# Patient Record
Sex: Male | Born: 1935 | Race: White | Hispanic: No | Marital: Single | Smoking: Never smoker
Health system: Southern US, Community
[De-identification: ages and names within clinical notes are randomized; demographics above are authoritative.]

## PROBLEM LIST (undated history)

## (undated) DIAGNOSIS — E079 Disorder of thyroid, unspecified: Secondary | ICD-10-CM

## (undated) DIAGNOSIS — E119 Type 2 diabetes mellitus without complications: Secondary | ICD-10-CM

## (undated) DIAGNOSIS — E7800 Pure hypercholesterolemia, unspecified: Secondary | ICD-10-CM

## (undated) DIAGNOSIS — N400 Enlarged prostate without lower urinary tract symptoms: Secondary | ICD-10-CM

## (undated) HISTORY — PX: OTHER SURGICAL HISTORY: SHX169

---

## 2002-01-19 ENCOUNTER — Ambulatory Visit (HOSPITAL_COMMUNITY): Admission: RE | Admit: 2002-01-19 | Discharge: 2002-01-19 | Payer: Self-pay | Admitting: Gastroenterology

## 2006-07-09 ENCOUNTER — Ambulatory Visit (HOSPITAL_COMMUNITY): Admission: RE | Admit: 2006-07-09 | Discharge: 2006-07-09 | Payer: Self-pay | Admitting: Ophthalmology

## 2007-07-16 ENCOUNTER — Encounter: Admission: RE | Admit: 2007-07-16 | Discharge: 2007-07-16 | Payer: Self-pay | Admitting: Internal Medicine

## 2011-02-08 NOTE — Procedures (Signed)
[facility]  Patient:    Jorge Butler, Jorge Butler Visit Number: [id number] MRN: [id number]          Service Type: END Location: ENDO Attending Physician:  Orland Mustard Dictated by:   Llana Aliment. Randa Evens, M.D. Proc. Date: 01/19/02 Admit Date:  01/19/2002   CC:         Gwen Pounds, M.D.  Barron Alvine, M.D.   Procedure Report  DATE OF BIRTH:  [birth year].  PROCEDURE:  Colonoscopy.  MEDICATIONS:  Fentanyl 75 mcg, Versed 7 mg IV.  SCOPE:  Olympus Adult video colonoscope.  INDICATION:  Strong family history of colon cancer. A brother had colon cancer another had metastatic cancer of the liver of unknown primary.  DESCRIPTION OF PROCEDURE:  The procedure had been explained to the patient and consent obtained. With the patient in the left lateral decubitus position, the Olympus Adult video colonoscope was inserted and advanced under direct visualization. The prep was excellent. We were able to reach the cecum without difficulty. The ileocecal valve and appendiceal orifice were seen. The scope was withdrawn and the cecum, ascending colon, hepatic flexure, transverse colon, splenic flexure, descending and sigmoid colon seen well. No polyps seen. No significant diverticular disease. The scope was withdrawn down to the rectum and the rectum was free of polyps. The patient tolerated the procedure well, was maintained on low flow oxygen and pulse oximeter throughout the procedure.  ASSESSMENT:  No evidence of colon polyps in this high risk individual.  PLAN:  Will recommend yearly Hemoccults and repeat procedure in five years. Dictated by:   Llana Aliment. Randa Evens, M.D. Attending Physician:  Orland Mustard DD:  01/19/02 TD:  01/19/02 Job: [id number]

## 2015-10-31 DIAGNOSIS — E119 Type 2 diabetes mellitus without complications: Secondary | ICD-10-CM | POA: Diagnosis not present

## 2015-10-31 DIAGNOSIS — H353132 Nonexudative age-related macular degeneration, bilateral, intermediate dry stage: Secondary | ICD-10-CM | POA: Diagnosis not present

## 2015-11-07 DIAGNOSIS — E298 Other testicular dysfunction: Secondary | ICD-10-CM | POA: Diagnosis not present

## 2015-11-28 DIAGNOSIS — E291 Testicular hypofunction: Secondary | ICD-10-CM | POA: Diagnosis not present

## 2015-12-06 DIAGNOSIS — H52223 Regular astigmatism, bilateral: Secondary | ICD-10-CM | POA: Diagnosis not present

## 2015-12-19 DIAGNOSIS — E298 Other testicular dysfunction: Secondary | ICD-10-CM | POA: Diagnosis not present

## 2015-12-27 DIAGNOSIS — R194 Change in bowel habit: Secondary | ICD-10-CM | POA: Diagnosis not present

## 2015-12-27 DIAGNOSIS — Z800 Family history of malignant neoplasm of digestive organs: Secondary | ICD-10-CM | POA: Diagnosis not present

## 2015-12-27 DIAGNOSIS — Z7984 Long term (current) use of oral hypoglycemic drugs: Secondary | ICD-10-CM | POA: Diagnosis not present

## 2015-12-27 DIAGNOSIS — E119 Type 2 diabetes mellitus without complications: Secondary | ICD-10-CM | POA: Diagnosis not present

## 2016-01-04 DIAGNOSIS — R194 Change in bowel habit: Secondary | ICD-10-CM | POA: Diagnosis not present

## 2016-01-09 DIAGNOSIS — E298 Other testicular dysfunction: Secondary | ICD-10-CM | POA: Diagnosis not present

## 2016-01-30 DIAGNOSIS — E291 Testicular hypofunction: Secondary | ICD-10-CM | POA: Diagnosis not present

## 2016-02-13 DIAGNOSIS — R194 Change in bowel habit: Secondary | ICD-10-CM | POA: Diagnosis not present

## 2016-02-20 DIAGNOSIS — E291 Testicular hypofunction: Secondary | ICD-10-CM | POA: Diagnosis not present

## 2016-02-27 DIAGNOSIS — E038 Other specified hypothyroidism: Secondary | ICD-10-CM | POA: Diagnosis not present

## 2016-02-27 DIAGNOSIS — Z125 Encounter for screening for malignant neoplasm of prostate: Secondary | ICD-10-CM | POA: Diagnosis not present

## 2016-02-27 DIAGNOSIS — Z0000 Encounter for general adult medical examination without abnormal findings: Secondary | ICD-10-CM | POA: Diagnosis not present

## 2016-02-27 DIAGNOSIS — E1122 Type 2 diabetes mellitus with diabetic chronic kidney disease: Secondary | ICD-10-CM | POA: Diagnosis not present

## 2016-03-05 DIAGNOSIS — R1319 Other dysphagia: Secondary | ICD-10-CM | POA: Diagnosis not present

## 2016-03-05 DIAGNOSIS — E230 Hypopituitarism: Secondary | ICD-10-CM | POA: Diagnosis not present

## 2016-03-05 DIAGNOSIS — R279 Unspecified lack of coordination: Secondary | ICD-10-CM | POA: Diagnosis not present

## 2016-03-05 DIAGNOSIS — D692 Other nonthrombocytopenic purpura: Secondary | ICD-10-CM | POA: Diagnosis not present

## 2016-03-05 DIAGNOSIS — N183 Chronic kidney disease, stage 3 (moderate): Secondary | ICD-10-CM | POA: Diagnosis not present

## 2016-03-05 DIAGNOSIS — N401 Enlarged prostate with lower urinary tract symptoms: Secondary | ICD-10-CM | POA: Diagnosis not present

## 2016-03-05 DIAGNOSIS — Z0000 Encounter for general adult medical examination without abnormal findings: Secondary | ICD-10-CM | POA: Diagnosis not present

## 2016-03-05 DIAGNOSIS — E1122 Type 2 diabetes mellitus with diabetic chronic kidney disease: Secondary | ICD-10-CM | POA: Diagnosis not present

## 2016-03-05 DIAGNOSIS — H4089 Other specified glaucoma: Secondary | ICD-10-CM | POA: Diagnosis not present

## 2016-03-05 DIAGNOSIS — H3530 Unspecified macular degeneration: Secondary | ICD-10-CM | POA: Diagnosis not present

## 2016-03-12 DIAGNOSIS — E038 Other specified hypothyroidism: Secondary | ICD-10-CM | POA: Diagnosis not present

## 2016-03-19 DIAGNOSIS — N183 Chronic kidney disease, stage 3 (moderate): Secondary | ICD-10-CM | POA: Diagnosis not present

## 2016-03-21 ENCOUNTER — Ambulatory Visit
Admission: RE | Admit: 2016-03-21 | Discharge: 2016-03-21 | Disposition: A | Discharge status: Home / Self Care | Payer: Medicare Other | Source: Ambulatory Visit | Attending: Internal Medicine | Admitting: Internal Medicine

## 2016-03-21 ENCOUNTER — Other Ambulatory Visit: Payer: Self-pay | Admitting: Internal Medicine

## 2016-03-21 DIAGNOSIS — N183 Chronic kidney disease, stage 3 unspecified: Secondary | ICD-10-CM

## 2016-03-21 DIAGNOSIS — N189 Chronic kidney disease, unspecified: Secondary | ICD-10-CM | POA: Diagnosis not present

## 2016-04-02 DIAGNOSIS — E298 Other testicular dysfunction: Secondary | ICD-10-CM | POA: Diagnosis not present

## 2016-04-23 DIAGNOSIS — E298 Other testicular dysfunction: Secondary | ICD-10-CM | POA: Diagnosis not present

## 2016-05-06 DIAGNOSIS — R972 Elevated prostate specific antigen [PSA]: Secondary | ICD-10-CM | POA: Diagnosis not present

## 2016-05-13 DIAGNOSIS — N401 Enlarged prostate with lower urinary tract symptoms: Secondary | ICD-10-CM | POA: Diagnosis not present

## 2016-05-13 DIAGNOSIS — R351 Nocturia: Secondary | ICD-10-CM | POA: Diagnosis not present

## 2016-05-13 DIAGNOSIS — R972 Elevated prostate specific antigen [PSA]: Secondary | ICD-10-CM | POA: Diagnosis not present

## 2016-05-14 DIAGNOSIS — E298 Other testicular dysfunction: Secondary | ICD-10-CM | POA: Diagnosis not present

## 2016-05-29 DIAGNOSIS — H353111 Nonexudative age-related macular degeneration, right eye, early dry stage: Secondary | ICD-10-CM | POA: Diagnosis not present

## 2016-05-29 DIAGNOSIS — E113291 Type 2 diabetes mellitus with mild nonproliferative diabetic retinopathy without macular edema, right eye: Secondary | ICD-10-CM | POA: Diagnosis not present

## 2016-05-29 DIAGNOSIS — H353121 Nonexudative age-related macular degeneration, left eye, early dry stage: Secondary | ICD-10-CM | POA: Diagnosis not present

## 2016-06-04 DIAGNOSIS — E291 Testicular hypofunction: Secondary | ICD-10-CM | POA: Diagnosis not present

## 2016-06-22 DIAGNOSIS — Z23 Encounter for immunization: Secondary | ICD-10-CM | POA: Diagnosis not present

## 2016-06-26 DIAGNOSIS — E291 Testicular hypofunction: Secondary | ICD-10-CM | POA: Diagnosis not present

## 2016-07-16 DIAGNOSIS — E291 Testicular hypofunction: Secondary | ICD-10-CM | POA: Diagnosis not present

## 2016-08-06 DIAGNOSIS — E291 Testicular hypofunction: Secondary | ICD-10-CM | POA: Diagnosis not present

## 2016-08-27 DIAGNOSIS — E291 Testicular hypofunction: Secondary | ICD-10-CM | POA: Diagnosis not present

## 2016-09-02 DIAGNOSIS — N401 Enlarged prostate with lower urinary tract symptoms: Secondary | ICD-10-CM | POA: Diagnosis not present

## 2016-09-02 DIAGNOSIS — E230 Hypopituitarism: Secondary | ICD-10-CM | POA: Diagnosis not present

## 2016-09-02 DIAGNOSIS — E038 Other specified hypothyroidism: Secondary | ICD-10-CM | POA: Diagnosis not present

## 2016-09-02 DIAGNOSIS — Z6825 Body mass index (BMI) 25.0-25.9, adult: Secondary | ICD-10-CM | POA: Diagnosis not present

## 2016-09-02 DIAGNOSIS — N183 Chronic kidney disease, stage 3 (moderate): Secondary | ICD-10-CM | POA: Diagnosis not present

## 2016-09-02 DIAGNOSIS — E1122 Type 2 diabetes mellitus with diabetic chronic kidney disease: Secondary | ICD-10-CM | POA: Diagnosis not present

## 2016-09-02 DIAGNOSIS — R278 Other lack of coordination: Secondary | ICD-10-CM | POA: Diagnosis not present

## 2016-09-18 DIAGNOSIS — E291 Testicular hypofunction: Secondary | ICD-10-CM | POA: Diagnosis not present

## 2016-10-08 DIAGNOSIS — E291 Testicular hypofunction: Secondary | ICD-10-CM | POA: Diagnosis not present

## 2016-10-29 DIAGNOSIS — E291 Testicular hypofunction: Secondary | ICD-10-CM | POA: Diagnosis not present

## 2016-11-19 DIAGNOSIS — E291 Testicular hypofunction: Secondary | ICD-10-CM | POA: Diagnosis not present

## 2016-11-27 DIAGNOSIS — H353132 Nonexudative age-related macular degeneration, bilateral, intermediate dry stage: Secondary | ICD-10-CM | POA: Diagnosis not present

## 2016-11-27 IMAGING — US US RENAL
1 series · 14 of 25 positions shown · non-contrast
Comparison: None.

CLINICAL DATA: Chronic renal failure.

EXAM:
RENAL / URINARY TRACT ULTRASOUND COMPLETE

[Series 1: us renal · 0.28mm/px · 14 of 35 slices shown]
[im 1/35]
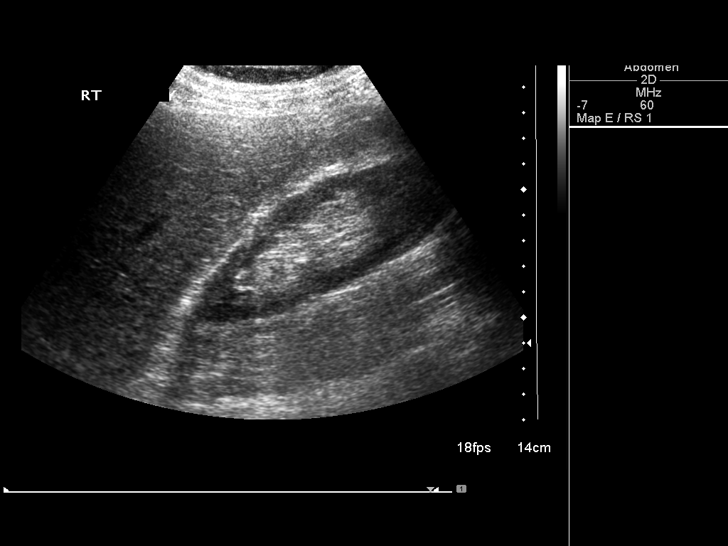
[im 3/35]
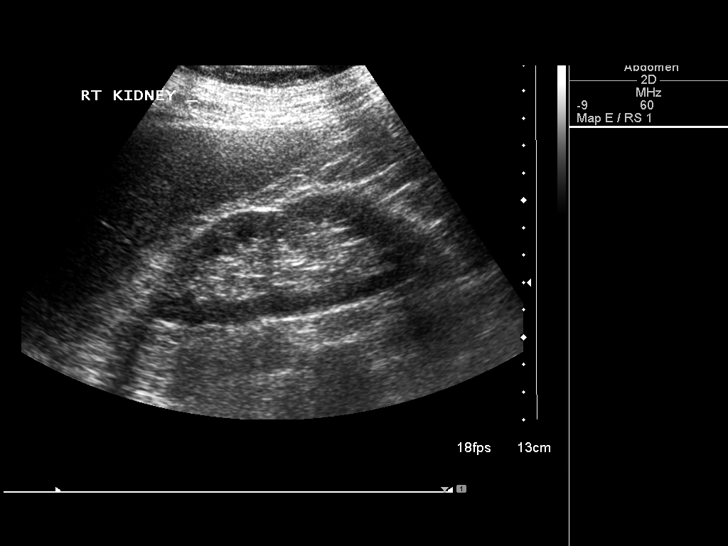
[im 6/35]
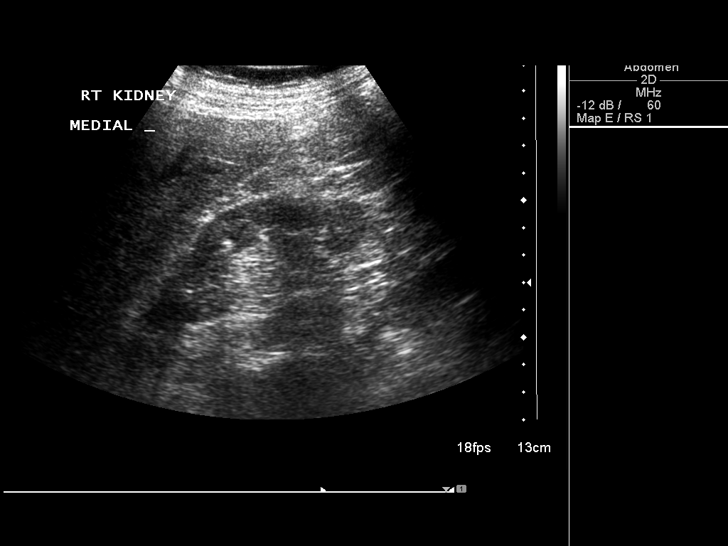
[im 9/35]
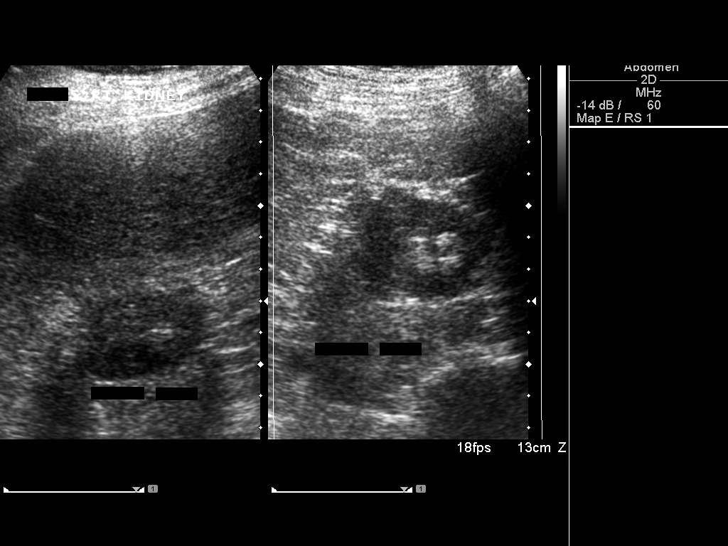
[im 12/35]
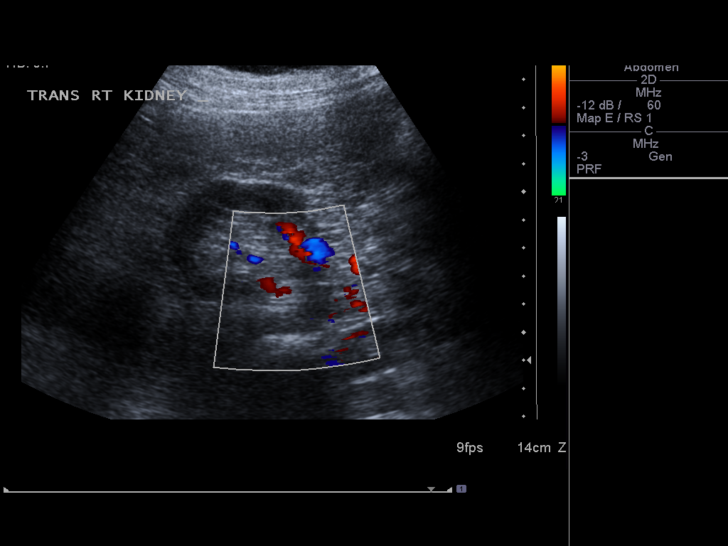
[im 13/35]
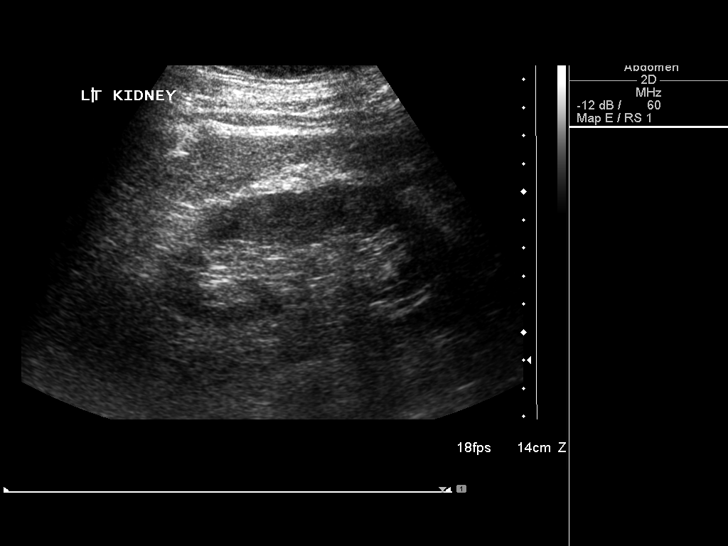
[im 16/35]
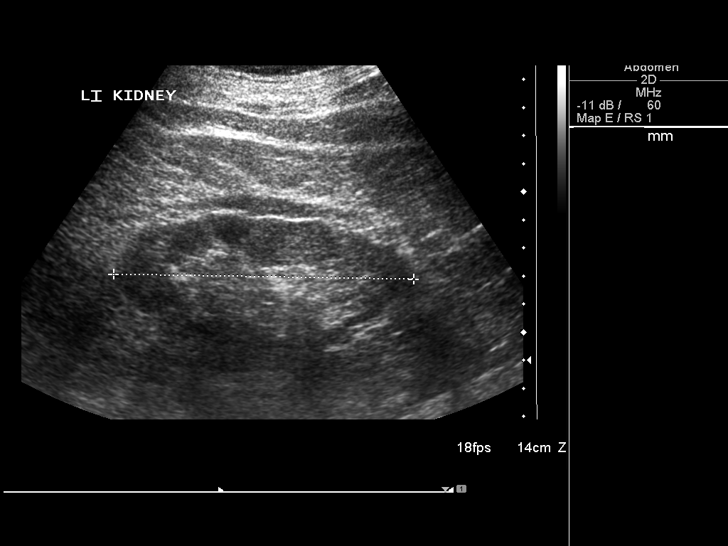
[im 19/35]
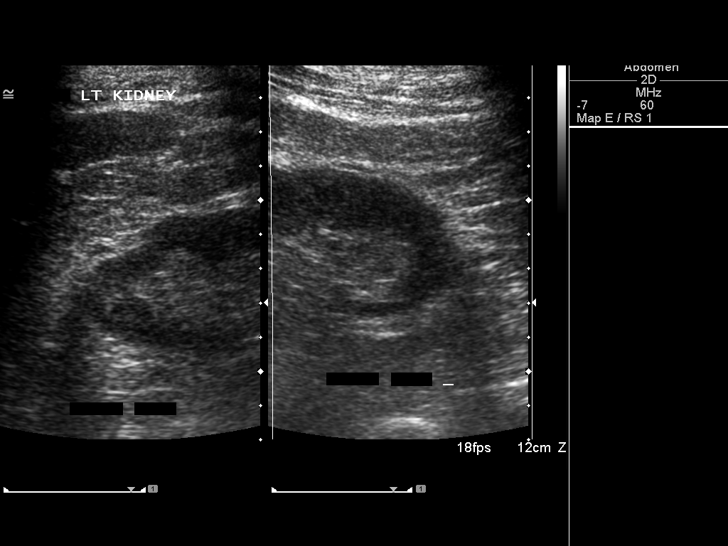
[im 22/35]
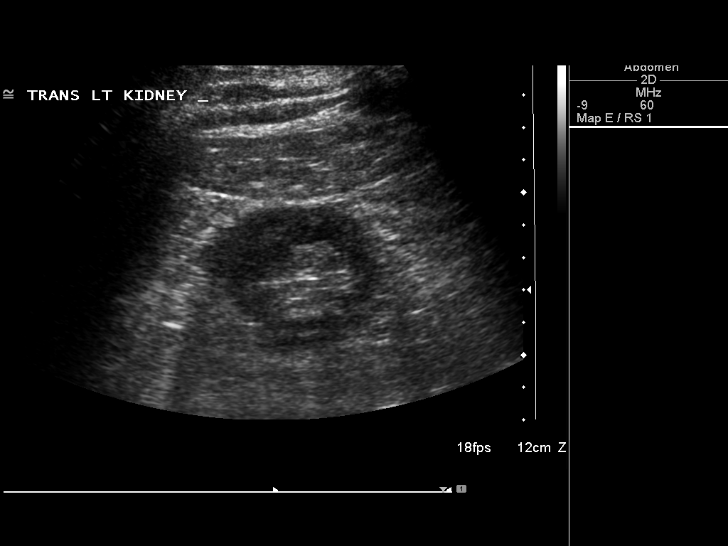
[im 23/35]
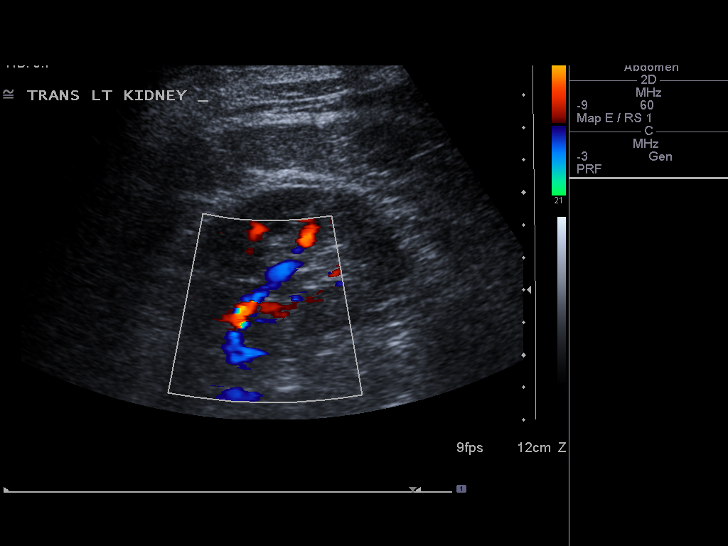
[im 26/35]
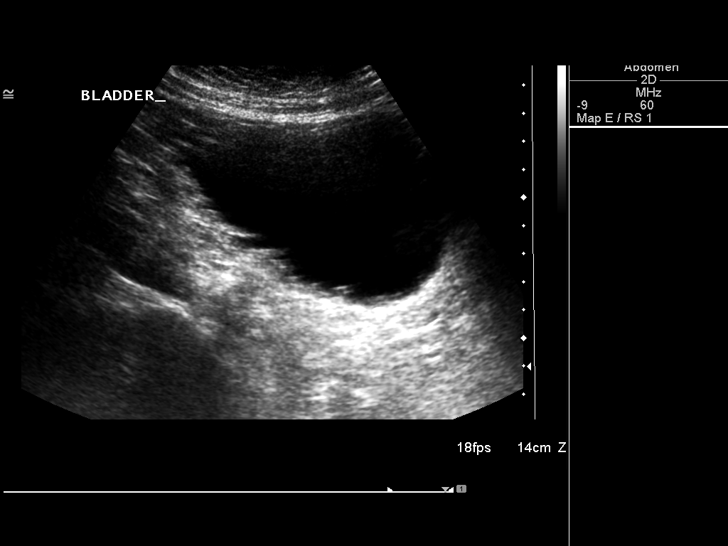
[im 29/35]
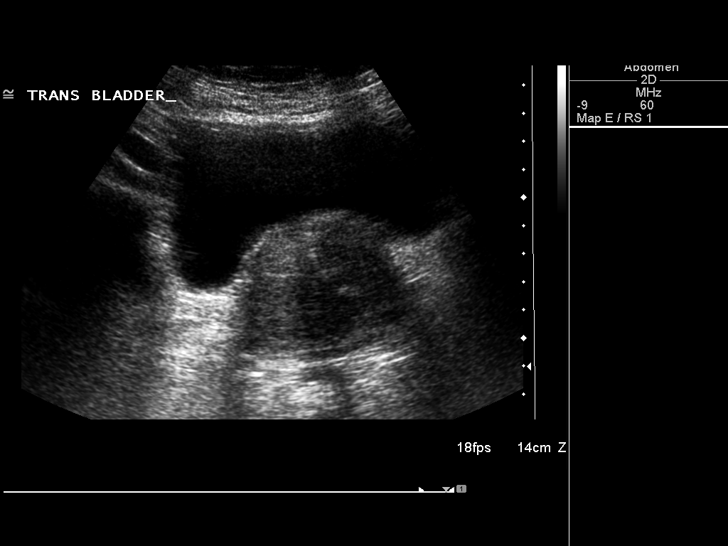
[im 32/35]
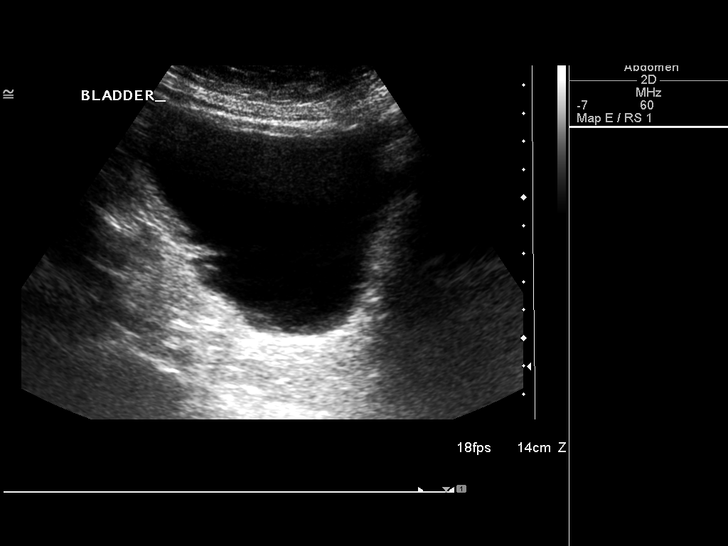
[im 35/35]
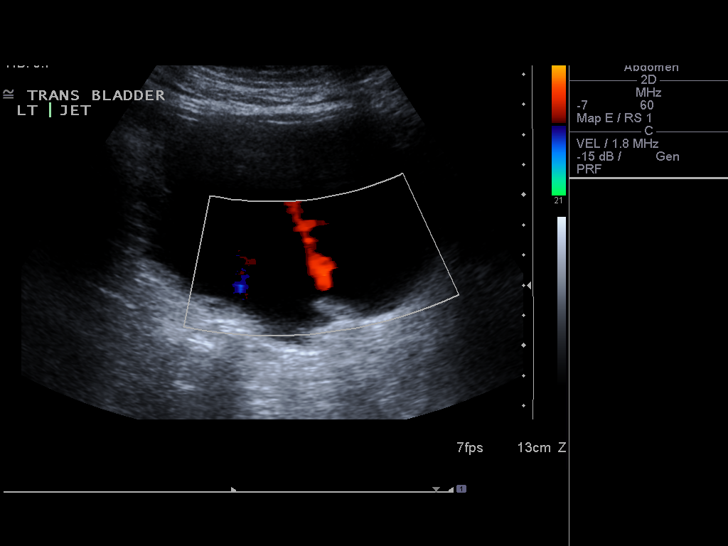

[14 of 25 positions shown; findings below may reference images not displayed]

FINDINGS: Right Kidney:

Length: 11.3 cm. Echogenicity within normal limits. No mass or
hydronephrosis visualized.

Left Kidney:

Length: 10.7 cm. Echogenicity within normal limits. No mass or
hydronephrosis visualized.

Bladder:

Appears normal for degree of bladder distention. Bilateral ureteral
jets are seen. The prostate gland is noted to be enlarged and
compressing on the posterior wall of the urinary bladder.
IMPRESSION: Normal appearance of the kidneys.

Prostate gland enlargement. Please correlate to patient's PSA
values.

## 2016-12-10 DIAGNOSIS — E291 Testicular hypofunction: Secondary | ICD-10-CM | POA: Diagnosis not present

## 2016-12-18 DIAGNOSIS — D352 Benign neoplasm of pituitary gland: Secondary | ICD-10-CM | POA: Diagnosis not present

## 2016-12-18 DIAGNOSIS — Z961 Presence of intraocular lens: Secondary | ICD-10-CM | POA: Diagnosis not present

## 2016-12-18 DIAGNOSIS — E113293 Type 2 diabetes mellitus with mild nonproliferative diabetic retinopathy without macular edema, bilateral: Secondary | ICD-10-CM | POA: Diagnosis not present

## 2016-12-18 DIAGNOSIS — H353132 Nonexudative age-related macular degeneration, bilateral, intermediate dry stage: Secondary | ICD-10-CM | POA: Diagnosis not present

## 2016-12-31 DIAGNOSIS — E291 Testicular hypofunction: Secondary | ICD-10-CM | POA: Diagnosis not present

## 2017-01-21 DIAGNOSIS — E291 Testicular hypofunction: Secondary | ICD-10-CM | POA: Diagnosis not present

## 2017-02-11 DIAGNOSIS — E298 Other testicular dysfunction: Secondary | ICD-10-CM | POA: Diagnosis not present

## 2017-02-27 DIAGNOSIS — Z125 Encounter for screening for malignant neoplasm of prostate: Secondary | ICD-10-CM | POA: Diagnosis not present

## 2017-02-27 DIAGNOSIS — E784 Other hyperlipidemia: Secondary | ICD-10-CM | POA: Diagnosis not present

## 2017-02-27 DIAGNOSIS — E1122 Type 2 diabetes mellitus with diabetic chronic kidney disease: Secondary | ICD-10-CM | POA: Diagnosis not present

## 2017-02-27 DIAGNOSIS — E038 Other specified hypothyroidism: Secondary | ICD-10-CM | POA: Diagnosis not present

## 2017-02-28 DIAGNOSIS — Z1212 Encounter for screening for malignant neoplasm of rectum: Secondary | ICD-10-CM | POA: Diagnosis not present

## 2017-03-06 DIAGNOSIS — N183 Chronic kidney disease, stage 3 (moderate): Secondary | ICD-10-CM | POA: Diagnosis not present

## 2017-03-06 DIAGNOSIS — E230 Hypopituitarism: Secondary | ICD-10-CM | POA: Diagnosis not present

## 2017-03-06 DIAGNOSIS — Z0000 Encounter for general adult medical examination without abnormal findings: Secondary | ICD-10-CM | POA: Diagnosis not present

## 2017-03-06 DIAGNOSIS — E1122 Type 2 diabetes mellitus with diabetic chronic kidney disease: Secondary | ICD-10-CM | POA: Diagnosis not present

## 2017-03-25 DIAGNOSIS — E298 Other testicular dysfunction: Secondary | ICD-10-CM | POA: Diagnosis not present

## 2017-04-15 DIAGNOSIS — E291 Testicular hypofunction: Secondary | ICD-10-CM | POA: Diagnosis not present

## 2017-05-06 DIAGNOSIS — E1122 Type 2 diabetes mellitus with diabetic chronic kidney disease: Secondary | ICD-10-CM | POA: Diagnosis not present

## 2017-05-27 DIAGNOSIS — E1122 Type 2 diabetes mellitus with diabetic chronic kidney disease: Secondary | ICD-10-CM | POA: Diagnosis not present

## 2017-06-04 DIAGNOSIS — H353132 Nonexudative age-related macular degeneration, bilateral, intermediate dry stage: Secondary | ICD-10-CM | POA: Diagnosis not present

## 2017-06-04 DIAGNOSIS — E113293 Type 2 diabetes mellitus with mild nonproliferative diabetic retinopathy without macular edema, bilateral: Secondary | ICD-10-CM | POA: Diagnosis not present

## 2017-06-04 DIAGNOSIS — H43813 Vitreous degeneration, bilateral: Secondary | ICD-10-CM | POA: Diagnosis not present

## 2017-06-11 DIAGNOSIS — R972 Elevated prostate specific antigen [PSA]: Secondary | ICD-10-CM | POA: Diagnosis not present

## 2017-06-11 DIAGNOSIS — N400 Enlarged prostate without lower urinary tract symptoms: Secondary | ICD-10-CM | POA: Diagnosis not present

## 2017-06-17 DIAGNOSIS — E291 Testicular hypofunction: Secondary | ICD-10-CM | POA: Diagnosis not present

## 2017-07-08 DIAGNOSIS — Z23 Encounter for immunization: Secondary | ICD-10-CM | POA: Diagnosis not present

## 2017-07-08 DIAGNOSIS — E298 Other testicular dysfunction: Secondary | ICD-10-CM | POA: Diagnosis not present

## 2017-07-29 DIAGNOSIS — E291 Testicular hypofunction: Secondary | ICD-10-CM | POA: Diagnosis not present

## 2017-08-19 DIAGNOSIS — E291 Testicular hypofunction: Secondary | ICD-10-CM | POA: Diagnosis not present

## 2017-09-02 DIAGNOSIS — E038 Other specified hypothyroidism: Secondary | ICD-10-CM | POA: Diagnosis not present

## 2017-09-02 DIAGNOSIS — E230 Hypopituitarism: Secondary | ICD-10-CM | POA: Diagnosis not present

## 2017-09-02 DIAGNOSIS — E1122 Type 2 diabetes mellitus with diabetic chronic kidney disease: Secondary | ICD-10-CM | POA: Diagnosis not present

## 2017-09-02 DIAGNOSIS — N183 Chronic kidney disease, stage 3 (moderate): Secondary | ICD-10-CM | POA: Diagnosis not present

## 2017-09-09 DIAGNOSIS — E298 Other testicular dysfunction: Secondary | ICD-10-CM | POA: Diagnosis not present

## 2017-09-30 DIAGNOSIS — E298 Other testicular dysfunction: Secondary | ICD-10-CM | POA: Diagnosis not present

## 2017-10-21 DIAGNOSIS — E291 Testicular hypofunction: Secondary | ICD-10-CM | POA: Diagnosis not present

## 2017-10-29 DIAGNOSIS — E1122 Type 2 diabetes mellitus with diabetic chronic kidney disease: Secondary | ICD-10-CM | POA: Diagnosis not present

## 2017-11-11 DIAGNOSIS — E298 Other testicular dysfunction: Secondary | ICD-10-CM | POA: Diagnosis not present

## 2017-12-02 DIAGNOSIS — H43813 Vitreous degeneration, bilateral: Secondary | ICD-10-CM | POA: Diagnosis not present

## 2017-12-02 DIAGNOSIS — E113293 Type 2 diabetes mellitus with mild nonproliferative diabetic retinopathy without macular edema, bilateral: Secondary | ICD-10-CM | POA: Diagnosis not present

## 2017-12-02 DIAGNOSIS — H353132 Nonexudative age-related macular degeneration, bilateral, intermediate dry stage: Secondary | ICD-10-CM | POA: Diagnosis not present

## 2017-12-03 DIAGNOSIS — E291 Testicular hypofunction: Secondary | ICD-10-CM | POA: Diagnosis not present

## 2017-12-17 DIAGNOSIS — H52221 Regular astigmatism, right eye: Secondary | ICD-10-CM | POA: Diagnosis not present

## 2017-12-23 DIAGNOSIS — E291 Testicular hypofunction: Secondary | ICD-10-CM | POA: Diagnosis not present

## 2018-01-13 DIAGNOSIS — E291 Testicular hypofunction: Secondary | ICD-10-CM | POA: Diagnosis not present

## 2018-01-21 DIAGNOSIS — N183 Chronic kidney disease, stage 3 (moderate): Secondary | ICD-10-CM | POA: Diagnosis not present

## 2018-01-21 DIAGNOSIS — I129 Hypertensive chronic kidney disease with stage 1 through stage 4 chronic kidney disease, or unspecified chronic kidney disease: Secondary | ICD-10-CM | POA: Diagnosis not present

## 2018-02-03 DIAGNOSIS — E291 Testicular hypofunction: Secondary | ICD-10-CM | POA: Diagnosis not present

## 2018-02-09 DIAGNOSIS — N183 Chronic kidney disease, stage 3 (moderate): Secondary | ICD-10-CM | POA: Diagnosis not present

## 2018-02-10 DIAGNOSIS — E871 Hypo-osmolality and hyponatremia: Secondary | ICD-10-CM | POA: Diagnosis not present

## 2018-02-12 DIAGNOSIS — Z6833 Body mass index (BMI) 33.0-33.9, adult: Secondary | ICD-10-CM | POA: Diagnosis not present

## 2018-02-12 DIAGNOSIS — R05 Cough: Secondary | ICD-10-CM | POA: Diagnosis not present

## 2018-02-12 DIAGNOSIS — E1122 Type 2 diabetes mellitus with diabetic chronic kidney disease: Secondary | ICD-10-CM | POA: Diagnosis not present

## 2018-02-25 DIAGNOSIS — E1122 Type 2 diabetes mellitus with diabetic chronic kidney disease: Secondary | ICD-10-CM | POA: Diagnosis not present

## 2018-02-25 DIAGNOSIS — R634 Abnormal weight loss: Secondary | ICD-10-CM | POA: Diagnosis not present

## 2018-02-25 DIAGNOSIS — E038 Other specified hypothyroidism: Secondary | ICD-10-CM | POA: Diagnosis not present

## 2018-02-25 DIAGNOSIS — N183 Chronic kidney disease, stage 3 (moderate): Secondary | ICD-10-CM | POA: Diagnosis not present

## 2018-03-03 DIAGNOSIS — Z125 Encounter for screening for malignant neoplasm of prostate: Secondary | ICD-10-CM | POA: Diagnosis not present

## 2018-03-03 DIAGNOSIS — E7849 Other hyperlipidemia: Secondary | ICD-10-CM | POA: Diagnosis not present

## 2018-03-03 DIAGNOSIS — R82998 Other abnormal findings in urine: Secondary | ICD-10-CM | POA: Diagnosis not present

## 2018-03-03 DIAGNOSIS — E038 Other specified hypothyroidism: Secondary | ICD-10-CM | POA: Diagnosis not present

## 2018-03-03 DIAGNOSIS — E1122 Type 2 diabetes mellitus with diabetic chronic kidney disease: Secondary | ICD-10-CM | POA: Diagnosis not present

## 2018-03-13 DIAGNOSIS — N184 Chronic kidney disease, stage 4 (severe): Secondary | ICD-10-CM | POA: Diagnosis not present

## 2018-03-13 DIAGNOSIS — D692 Other nonthrombocytopenic purpura: Secondary | ICD-10-CM | POA: Diagnosis not present

## 2018-03-13 DIAGNOSIS — Z0000 Encounter for general adult medical examination without abnormal findings: Secondary | ICD-10-CM | POA: Diagnosis not present

## 2018-03-13 DIAGNOSIS — E1122 Type 2 diabetes mellitus with diabetic chronic kidney disease: Secondary | ICD-10-CM | POA: Diagnosis not present

## 2018-03-17 DIAGNOSIS — E298 Other testicular dysfunction: Secondary | ICD-10-CM | POA: Diagnosis not present

## 2018-03-23 DIAGNOSIS — H9193 Unspecified hearing loss, bilateral: Secondary | ICD-10-CM | POA: Diagnosis not present

## 2018-03-23 DIAGNOSIS — Z6823 Body mass index (BMI) 23.0-23.9, adult: Secondary | ICD-10-CM | POA: Diagnosis not present

## 2018-03-23 DIAGNOSIS — H6123 Impacted cerumen, bilateral: Secondary | ICD-10-CM | POA: Diagnosis not present

## 2018-04-07 DIAGNOSIS — E298 Other testicular dysfunction: Secondary | ICD-10-CM | POA: Diagnosis not present

## 2018-04-28 DIAGNOSIS — E298 Other testicular dysfunction: Secondary | ICD-10-CM | POA: Diagnosis not present

## 2018-05-19 DIAGNOSIS — E291 Testicular hypofunction: Secondary | ICD-10-CM | POA: Diagnosis not present

## 2018-06-02 DIAGNOSIS — H353132 Nonexudative age-related macular degeneration, bilateral, intermediate dry stage: Secondary | ICD-10-CM | POA: Diagnosis not present

## 2018-06-02 DIAGNOSIS — H43813 Vitreous degeneration, bilateral: Secondary | ICD-10-CM | POA: Diagnosis not present

## 2018-06-02 DIAGNOSIS — E113293 Type 2 diabetes mellitus with mild nonproliferative diabetic retinopathy without macular edema, bilateral: Secondary | ICD-10-CM | POA: Diagnosis not present

## 2018-06-16 DIAGNOSIS — N401 Enlarged prostate with lower urinary tract symptoms: Secondary | ICD-10-CM | POA: Diagnosis not present

## 2018-06-16 DIAGNOSIS — R3914 Feeling of incomplete bladder emptying: Secondary | ICD-10-CM | POA: Diagnosis not present

## 2018-06-30 DIAGNOSIS — Z23 Encounter for immunization: Secondary | ICD-10-CM | POA: Diagnosis not present

## 2018-07-01 DIAGNOSIS — E298 Other testicular dysfunction: Secondary | ICD-10-CM | POA: Diagnosis not present

## 2018-07-21 DIAGNOSIS — E298 Other testicular dysfunction: Secondary | ICD-10-CM | POA: Diagnosis not present

## 2018-08-10 DIAGNOSIS — R3914 Feeling of incomplete bladder emptying: Secondary | ICD-10-CM | POA: Diagnosis not present

## 2018-08-10 DIAGNOSIS — N401 Enlarged prostate with lower urinary tract symptoms: Secondary | ICD-10-CM | POA: Diagnosis not present

## 2018-08-11 DIAGNOSIS — E291 Testicular hypofunction: Secondary | ICD-10-CM | POA: Diagnosis not present

## 2018-08-25 DIAGNOSIS — N184 Chronic kidney disease, stage 4 (severe): Secondary | ICD-10-CM | POA: Diagnosis not present

## 2018-08-25 DIAGNOSIS — E1122 Type 2 diabetes mellitus with diabetic chronic kidney disease: Secondary | ICD-10-CM | POA: Diagnosis not present

## 2018-08-25 DIAGNOSIS — E038 Other specified hypothyroidism: Secondary | ICD-10-CM | POA: Diagnosis not present

## 2018-08-25 DIAGNOSIS — N401 Enlarged prostate with lower urinary tract symptoms: Secondary | ICD-10-CM | POA: Diagnosis not present

## 2018-09-01 ENCOUNTER — Other Ambulatory Visit: Payer: Self-pay | Admitting: Internal Medicine

## 2018-09-01 DIAGNOSIS — R944 Abnormal results of kidney function studies: Secondary | ICD-10-CM | POA: Diagnosis not present

## 2018-09-01 DIAGNOSIS — R7989 Other specified abnormal findings of blood chemistry: Secondary | ICD-10-CM

## 2018-09-01 DIAGNOSIS — E291 Testicular hypofunction: Secondary | ICD-10-CM | POA: Diagnosis not present

## 2018-09-08 ENCOUNTER — Ambulatory Visit
Admission: RE | Admit: 2018-09-08 | Discharge: 2018-09-08 | Disposition: A | Discharge status: Home / Self Care | Payer: Medicare Other | Source: Ambulatory Visit | Attending: Internal Medicine | Admitting: Internal Medicine

## 2018-09-08 DIAGNOSIS — N2889 Other specified disorders of kidney and ureter: Secondary | ICD-10-CM | POA: Diagnosis not present

## 2018-09-08 DIAGNOSIS — R7989 Other specified abnormal findings of blood chemistry: Secondary | ICD-10-CM

## 2018-09-29 DIAGNOSIS — E291 Testicular hypofunction: Secondary | ICD-10-CM | POA: Diagnosis not present

## 2018-10-20 DIAGNOSIS — R3914 Feeling of incomplete bladder emptying: Secondary | ICD-10-CM | POA: Diagnosis not present

## 2018-10-20 DIAGNOSIS — N184 Chronic kidney disease, stage 4 (severe): Secondary | ICD-10-CM | POA: Diagnosis not present

## 2018-10-20 DIAGNOSIS — N401 Enlarged prostate with lower urinary tract symptoms: Secondary | ICD-10-CM | POA: Diagnosis not present

## 2018-10-21 DIAGNOSIS — E291 Testicular hypofunction: Secondary | ICD-10-CM | POA: Diagnosis not present

## 2018-11-11 DIAGNOSIS — E1122 Type 2 diabetes mellitus with diabetic chronic kidney disease: Secondary | ICD-10-CM | POA: Diagnosis not present

## 2018-11-11 DIAGNOSIS — E291 Testicular hypofunction: Secondary | ICD-10-CM | POA: Diagnosis not present

## 2018-11-11 DIAGNOSIS — R944 Abnormal results of kidney function studies: Secondary | ICD-10-CM | POA: Diagnosis not present

## 2018-11-11 DIAGNOSIS — N184 Chronic kidney disease, stage 4 (severe): Secondary | ICD-10-CM | POA: Diagnosis not present

## 2018-11-11 DIAGNOSIS — E038 Other specified hypothyroidism: Secondary | ICD-10-CM | POA: Diagnosis not present

## 2018-12-01 DIAGNOSIS — E291 Testicular hypofunction: Secondary | ICD-10-CM | POA: Diagnosis not present

## 2018-12-22 DIAGNOSIS — E291 Testicular hypofunction: Secondary | ICD-10-CM | POA: Diagnosis not present

## 2019-01-12 DIAGNOSIS — E291 Testicular hypofunction: Secondary | ICD-10-CM | POA: Diagnosis not present

## 2019-02-02 DIAGNOSIS — E291 Testicular hypofunction: Secondary | ICD-10-CM | POA: Diagnosis not present

## 2019-02-23 DIAGNOSIS — E291 Testicular hypofunction: Secondary | ICD-10-CM | POA: Diagnosis not present

## 2019-03-09 DIAGNOSIS — H40053 Ocular hypertension, bilateral: Secondary | ICD-10-CM | POA: Diagnosis not present

## 2019-03-09 DIAGNOSIS — E1122 Type 2 diabetes mellitus with diabetic chronic kidney disease: Secondary | ICD-10-CM | POA: Diagnosis not present

## 2019-03-09 DIAGNOSIS — E038 Other specified hypothyroidism: Secondary | ICD-10-CM | POA: Diagnosis not present

## 2019-03-09 DIAGNOSIS — E119 Type 2 diabetes mellitus without complications: Secondary | ICD-10-CM | POA: Diagnosis not present

## 2019-03-09 DIAGNOSIS — R82998 Other abnormal findings in urine: Secondary | ICD-10-CM | POA: Diagnosis not present

## 2019-03-09 DIAGNOSIS — N184 Chronic kidney disease, stage 4 (severe): Secondary | ICD-10-CM | POA: Diagnosis not present

## 2019-03-09 DIAGNOSIS — H353132 Nonexudative age-related macular degeneration, bilateral, intermediate dry stage: Secondary | ICD-10-CM | POA: Diagnosis not present

## 2019-03-09 DIAGNOSIS — H40013 Open angle with borderline findings, low risk, bilateral: Secondary | ICD-10-CM | POA: Diagnosis not present

## 2019-03-09 DIAGNOSIS — E7849 Other hyperlipidemia: Secondary | ICD-10-CM | POA: Diagnosis not present

## 2019-03-16 DIAGNOSIS — Z0000 Encounter for general adult medical examination without abnormal findings: Secondary | ICD-10-CM | POA: Diagnosis not present

## 2019-03-16 DIAGNOSIS — E291 Testicular hypofunction: Secondary | ICD-10-CM | POA: Diagnosis not present

## 2019-03-16 DIAGNOSIS — N401 Enlarged prostate with lower urinary tract symptoms: Secondary | ICD-10-CM | POA: Diagnosis not present

## 2019-03-16 DIAGNOSIS — H3530 Unspecified macular degeneration: Secondary | ICD-10-CM | POA: Diagnosis not present

## 2019-04-01 DIAGNOSIS — E113293 Type 2 diabetes mellitus with mild nonproliferative diabetic retinopathy without macular edema, bilateral: Secondary | ICD-10-CM | POA: Diagnosis not present

## 2019-04-01 DIAGNOSIS — H353123 Nonexudative age-related macular degeneration, left eye, advanced atrophic without subfoveal involvement: Secondary | ICD-10-CM | POA: Diagnosis not present

## 2019-04-01 DIAGNOSIS — H43813 Vitreous degeneration, bilateral: Secondary | ICD-10-CM | POA: Diagnosis not present

## 2019-04-01 DIAGNOSIS — H353112 Nonexudative age-related macular degeneration, right eye, intermediate dry stage: Secondary | ICD-10-CM | POA: Diagnosis not present

## 2019-04-06 DIAGNOSIS — E291 Testicular hypofunction: Secondary | ICD-10-CM | POA: Diagnosis not present

## 2019-04-21 DIAGNOSIS — N401 Enlarged prostate with lower urinary tract symptoms: Secondary | ICD-10-CM | POA: Diagnosis not present

## 2019-04-21 DIAGNOSIS — R350 Frequency of micturition: Secondary | ICD-10-CM | POA: Diagnosis not present

## 2019-04-21 DIAGNOSIS — N184 Chronic kidney disease, stage 4 (severe): Secondary | ICD-10-CM | POA: Diagnosis not present

## 2019-04-21 DIAGNOSIS — R972 Elevated prostate specific antigen [PSA]: Secondary | ICD-10-CM | POA: Diagnosis not present

## 2019-04-27 DIAGNOSIS — E291 Testicular hypofunction: Secondary | ICD-10-CM | POA: Diagnosis not present

## 2019-05-18 DIAGNOSIS — E291 Testicular hypofunction: Secondary | ICD-10-CM | POA: Diagnosis not present

## 2019-06-08 DIAGNOSIS — Z23 Encounter for immunization: Secondary | ICD-10-CM | POA: Diagnosis not present

## 2019-06-08 DIAGNOSIS — E291 Testicular hypofunction: Secondary | ICD-10-CM | POA: Diagnosis not present

## 2019-06-29 DIAGNOSIS — E291 Testicular hypofunction: Secondary | ICD-10-CM | POA: Diagnosis not present

## 2019-07-19 DIAGNOSIS — E291 Testicular hypofunction: Secondary | ICD-10-CM | POA: Diagnosis not present

## 2019-07-19 DIAGNOSIS — D692 Other nonthrombocytopenic purpura: Secondary | ICD-10-CM | POA: Diagnosis not present

## 2019-07-19 DIAGNOSIS — N184 Chronic kidney disease, stage 4 (severe): Secondary | ICD-10-CM | POA: Diagnosis not present

## 2019-07-19 DIAGNOSIS — E1122 Type 2 diabetes mellitus with diabetic chronic kidney disease: Secondary | ICD-10-CM | POA: Diagnosis not present

## 2019-07-25 DIAGNOSIS — D692 Other nonthrombocytopenic purpura: Secondary | ICD-10-CM | POA: Diagnosis not present

## 2019-07-25 DIAGNOSIS — E291 Testicular hypofunction: Secondary | ICD-10-CM | POA: Diagnosis not present

## 2019-07-25 DIAGNOSIS — N184 Chronic kidney disease, stage 4 (severe): Secondary | ICD-10-CM | POA: Diagnosis not present

## 2019-07-25 DIAGNOSIS — E1122 Type 2 diabetes mellitus with diabetic chronic kidney disease: Secondary | ICD-10-CM | POA: Diagnosis not present

## 2019-08-10 DIAGNOSIS — E291 Testicular hypofunction: Secondary | ICD-10-CM | POA: Diagnosis not present

## 2019-08-31 DIAGNOSIS — E291 Testicular hypofunction: Secondary | ICD-10-CM | POA: Diagnosis not present

## 2019-09-21 DIAGNOSIS — E291 Testicular hypofunction: Secondary | ICD-10-CM | POA: Diagnosis not present

## 2019-09-30 DIAGNOSIS — H35033 Hypertensive retinopathy, bilateral: Secondary | ICD-10-CM | POA: Diagnosis not present

## 2019-09-30 DIAGNOSIS — H353123 Nonexudative age-related macular degeneration, left eye, advanced atrophic without subfoveal involvement: Secondary | ICD-10-CM | POA: Diagnosis not present

## 2019-09-30 DIAGNOSIS — H43813 Vitreous degeneration, bilateral: Secondary | ICD-10-CM | POA: Diagnosis not present

## 2019-09-30 DIAGNOSIS — H353112 Nonexudative age-related macular degeneration, right eye, intermediate dry stage: Secondary | ICD-10-CM | POA: Diagnosis not present

## 2019-10-11 DIAGNOSIS — R3914 Feeling of incomplete bladder emptying: Secondary | ICD-10-CM | POA: Diagnosis not present

## 2019-10-11 DIAGNOSIS — N401 Enlarged prostate with lower urinary tract symptoms: Secondary | ICD-10-CM | POA: Diagnosis not present

## 2019-10-12 DIAGNOSIS — E291 Testicular hypofunction: Secondary | ICD-10-CM | POA: Diagnosis not present

## 2019-11-02 DIAGNOSIS — E291 Testicular hypofunction: Secondary | ICD-10-CM | POA: Diagnosis not present

## 2019-11-18 DIAGNOSIS — R030 Elevated blood-pressure reading, without diagnosis of hypertension: Secondary | ICD-10-CM | POA: Diagnosis not present

## 2019-11-18 DIAGNOSIS — N401 Enlarged prostate with lower urinary tract symptoms: Secondary | ICD-10-CM | POA: Diagnosis not present

## 2019-11-18 DIAGNOSIS — H3530 Unspecified macular degeneration: Secondary | ICD-10-CM | POA: Diagnosis not present

## 2019-11-18 DIAGNOSIS — E1122 Type 2 diabetes mellitus with diabetic chronic kidney disease: Secondary | ICD-10-CM | POA: Diagnosis not present

## 2019-11-23 DIAGNOSIS — E1122 Type 2 diabetes mellitus with diabetic chronic kidney disease: Secondary | ICD-10-CM | POA: Diagnosis not present

## 2019-11-23 DIAGNOSIS — E291 Testicular hypofunction: Secondary | ICD-10-CM | POA: Diagnosis not present

## 2019-11-23 DIAGNOSIS — E7849 Other hyperlipidemia: Secondary | ICD-10-CM | POA: Diagnosis not present

## 2019-12-14 DIAGNOSIS — E291 Testicular hypofunction: Secondary | ICD-10-CM | POA: Diagnosis not present

## 2019-12-15 DIAGNOSIS — H35033 Hypertensive retinopathy, bilateral: Secondary | ICD-10-CM | POA: Diagnosis not present

## 2019-12-15 DIAGNOSIS — E113293 Type 2 diabetes mellitus with mild nonproliferative diabetic retinopathy without macular edema, bilateral: Secondary | ICD-10-CM | POA: Diagnosis not present

## 2019-12-15 DIAGNOSIS — H353211 Exudative age-related macular degeneration, right eye, with active choroidal neovascularization: Secondary | ICD-10-CM | POA: Diagnosis not present

## 2019-12-15 DIAGNOSIS — H353123 Nonexudative age-related macular degeneration, left eye, advanced atrophic without subfoveal involvement: Secondary | ICD-10-CM | POA: Diagnosis not present

## 2019-12-28 IMAGING — US US RENAL
1 series · 14 of 25 positions shown · non-contrast
Comparison: 03/21/2016.

CLINICAL DATA: 86-year-old male with elevated creatinine.
Subsequent encounter.

EXAM:
RENAL / URINARY TRACT ULTRASOUND COMPLETE

[Series 1: us renal · 0.23mm/px · 14 of 51 slices shown]
[im 1/51]
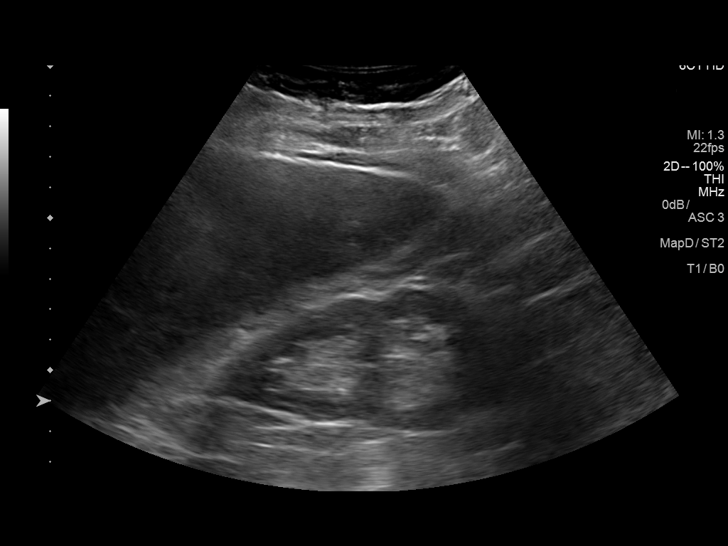
[im 5/51]
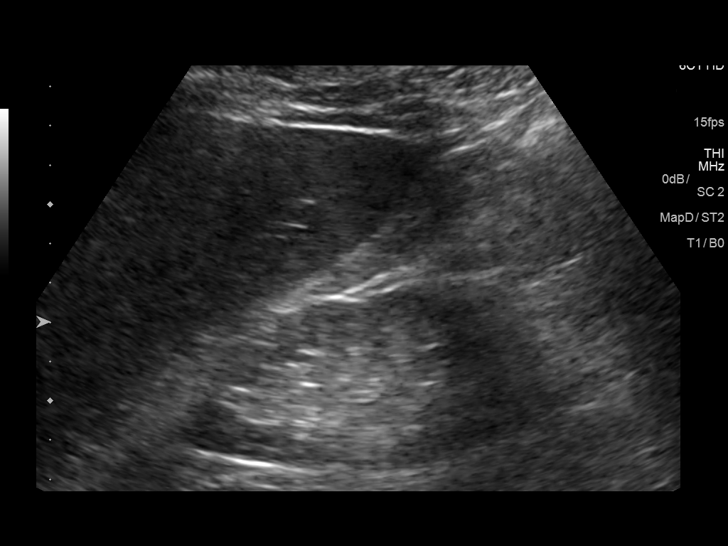
[im 9/51]
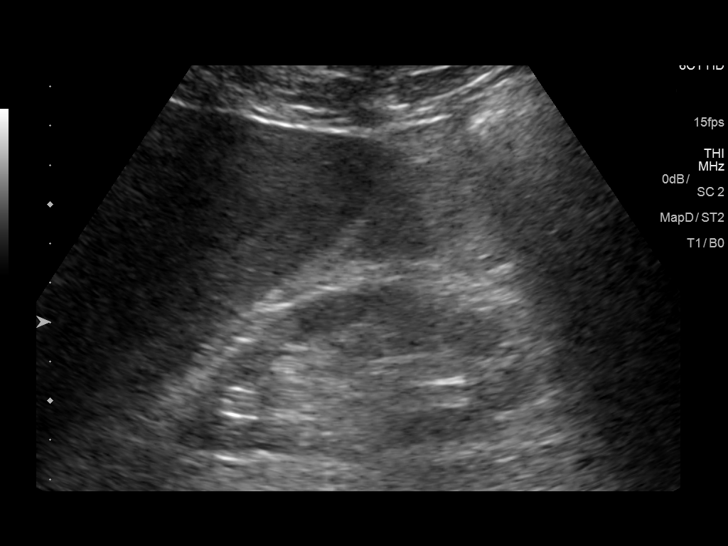
[im 13/51]
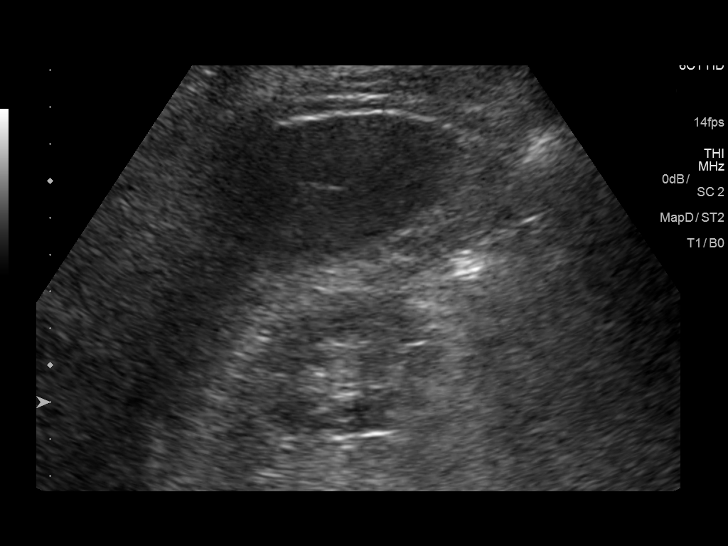
[im 17/51]
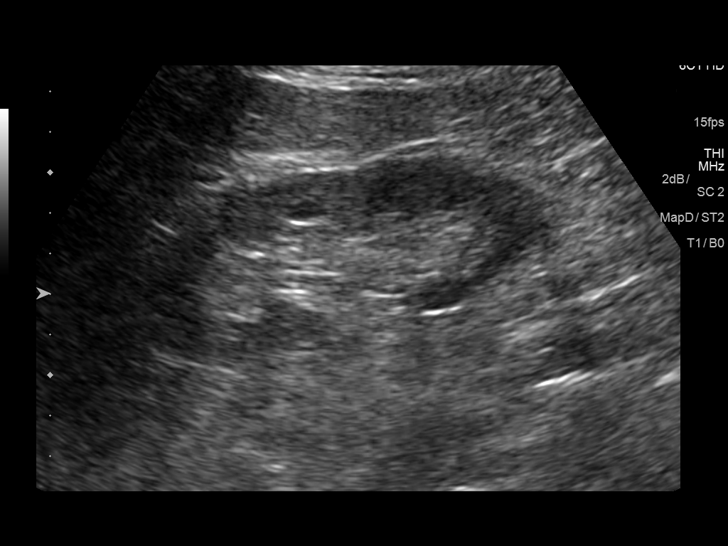
[im 19/51]
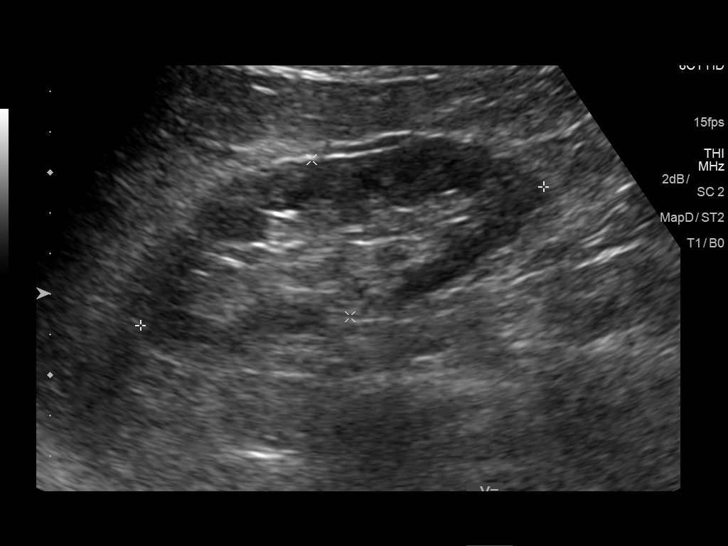
[im 23/51]
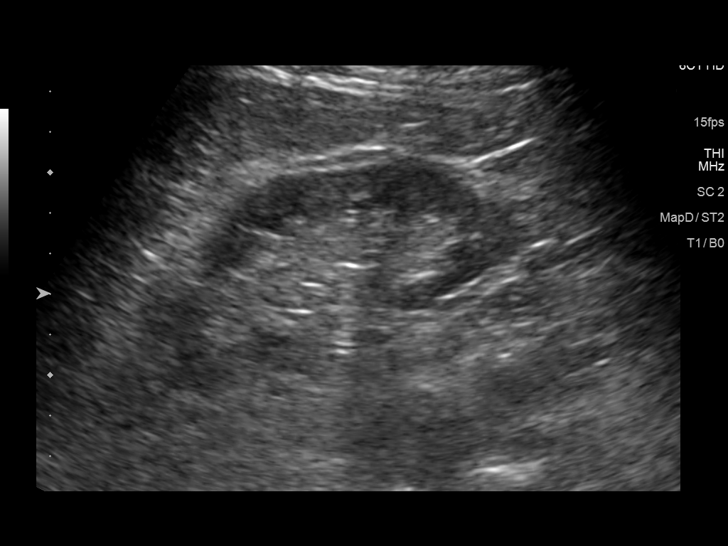
[im 28/51]
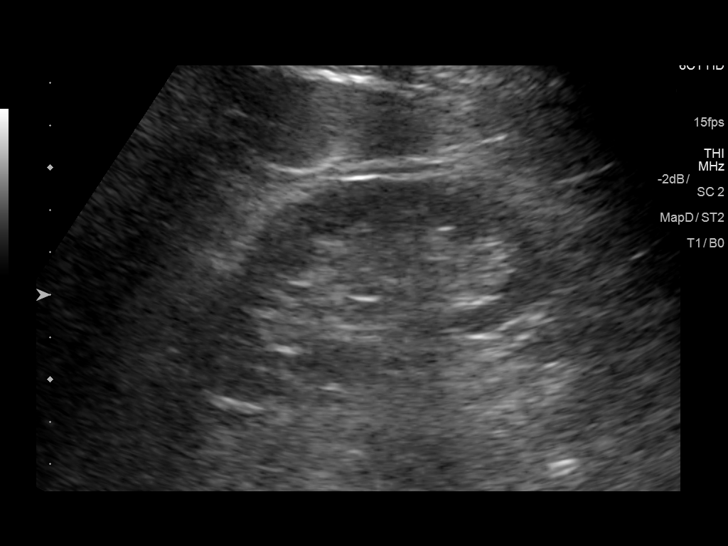
[im 32/51]
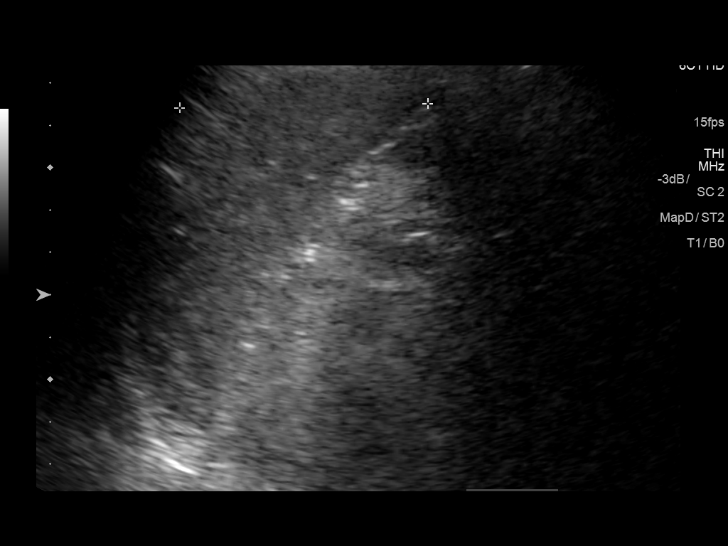
[im 34/51]
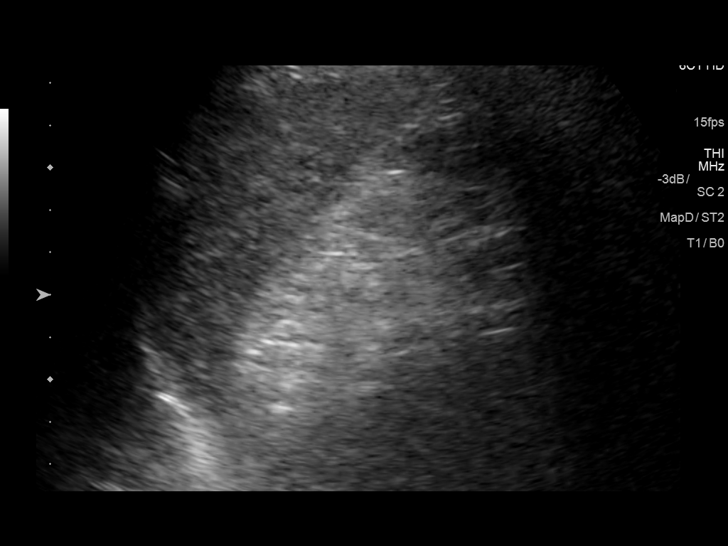
[im 38/51]
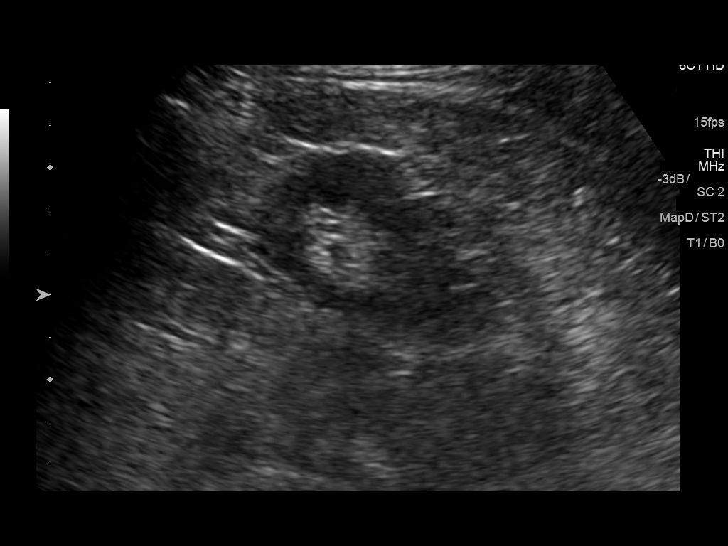
[im 42/51]
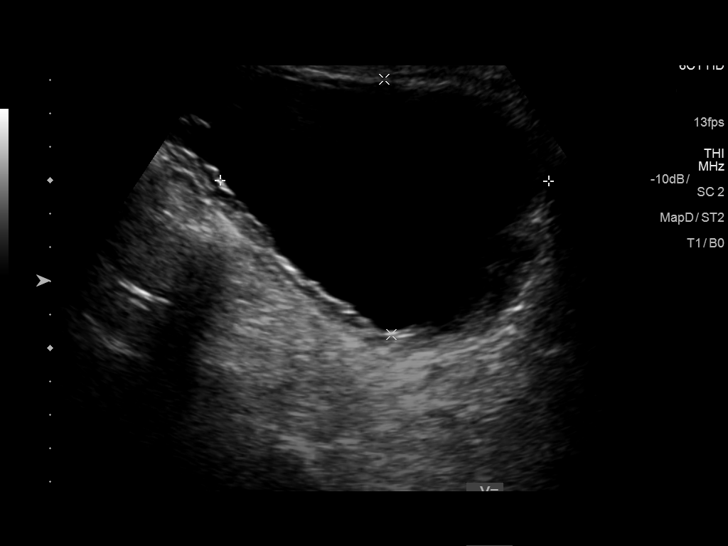
[im 46/51]
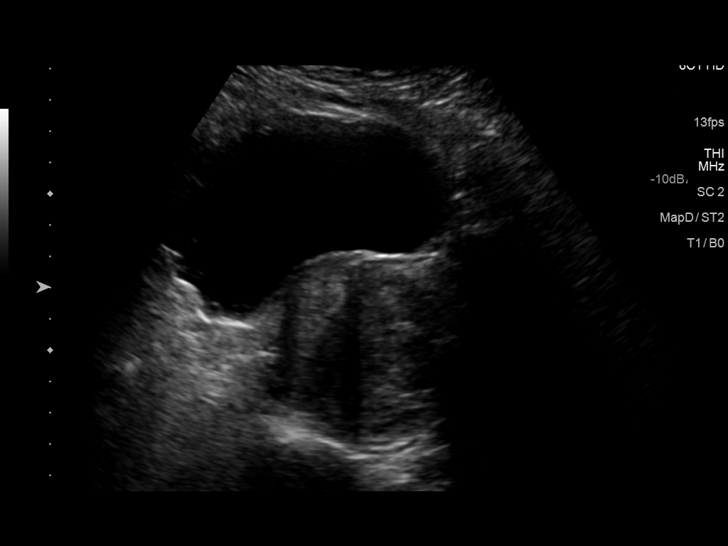
[im 51/51]
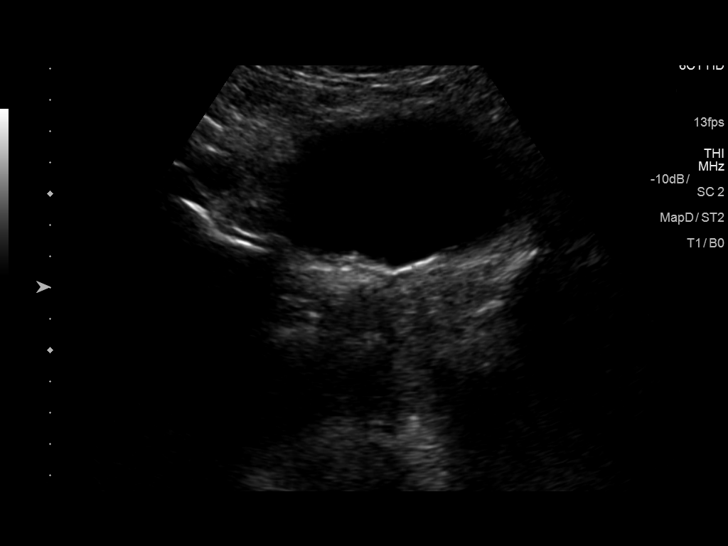

[14 of 25 positions shown; findings below may reference images not displayed]

FINDINGS: Right Kidney:

Renal measurements: 9.8 x 4.6 x 4.6 cm = volume: 108 mL. Slight
increased echogenicity. No mass or hydronephrosis noted.

Left Kidney:

Renal measurements: 10.5 x 4 x 4 cm = volume: 88.5 ML. Minimal
increased echogenicity. No hydronephrosis or mass noted.

Bladder:

Appears normal for degree of bladder distention. Prevoid volume 371
cc. Postvoid volume 232 cc.

Splenic length 5.9 cm.
IMPRESSION: 1. No hydronephrosis.
2. Slight increased renal echogenicity suggestive of result of
medical renal disease.
3. Pre and postvoid volumes as noted above.
4. Enlarged prostate gland impressing upon the bladder base.
Clinical and laboratory correlation recommended.
5. Splenic length 5.9 cm.

## 2020-01-04 DIAGNOSIS — E291 Testicular hypofunction: Secondary | ICD-10-CM | POA: Diagnosis not present

## 2020-01-17 DIAGNOSIS — H35033 Hypertensive retinopathy, bilateral: Secondary | ICD-10-CM | POA: Diagnosis not present

## 2020-01-17 DIAGNOSIS — H353211 Exudative age-related macular degeneration, right eye, with active choroidal neovascularization: Secondary | ICD-10-CM | POA: Diagnosis not present

## 2020-01-17 DIAGNOSIS — H43813 Vitreous degeneration, bilateral: Secondary | ICD-10-CM | POA: Diagnosis not present

## 2020-01-17 DIAGNOSIS — H353123 Nonexudative age-related macular degeneration, left eye, advanced atrophic without subfoveal involvement: Secondary | ICD-10-CM | POA: Diagnosis not present

## 2020-01-25 DIAGNOSIS — E291 Testicular hypofunction: Secondary | ICD-10-CM | POA: Diagnosis not present

## 2020-02-14 DIAGNOSIS — H353211 Exudative age-related macular degeneration, right eye, with active choroidal neovascularization: Secondary | ICD-10-CM | POA: Diagnosis not present

## 2020-02-14 DIAGNOSIS — H353123 Nonexudative age-related macular degeneration, left eye, advanced atrophic without subfoveal involvement: Secondary | ICD-10-CM | POA: Diagnosis not present

## 2020-02-15 DIAGNOSIS — E291 Testicular hypofunction: Secondary | ICD-10-CM | POA: Diagnosis not present

## 2020-03-07 DIAGNOSIS — E291 Testicular hypofunction: Secondary | ICD-10-CM | POA: Diagnosis not present

## 2020-03-10 DIAGNOSIS — E7849 Other hyperlipidemia: Secondary | ICD-10-CM | POA: Diagnosis not present

## 2020-03-10 DIAGNOSIS — H353211 Exudative age-related macular degeneration, right eye, with active choroidal neovascularization: Secondary | ICD-10-CM | POA: Diagnosis not present

## 2020-03-10 DIAGNOSIS — Z0000 Encounter for general adult medical examination without abnormal findings: Secondary | ICD-10-CM | POA: Diagnosis not present

## 2020-03-10 DIAGNOSIS — E119 Type 2 diabetes mellitus without complications: Secondary | ICD-10-CM | POA: Diagnosis not present

## 2020-03-10 DIAGNOSIS — E291 Testicular hypofunction: Secondary | ICD-10-CM | POA: Diagnosis not present

## 2020-03-10 DIAGNOSIS — E1122 Type 2 diabetes mellitus with diabetic chronic kidney disease: Secondary | ICD-10-CM | POA: Diagnosis not present

## 2020-03-10 DIAGNOSIS — H40033 Anatomical narrow angle, bilateral: Secondary | ICD-10-CM | POA: Diagnosis not present

## 2020-03-10 DIAGNOSIS — E038 Other specified hypothyroidism: Secondary | ICD-10-CM | POA: Diagnosis not present

## 2020-03-10 DIAGNOSIS — H353122 Nonexudative age-related macular degeneration, left eye, intermediate dry stage: Secondary | ICD-10-CM | POA: Diagnosis not present

## 2020-03-17 DIAGNOSIS — R82998 Other abnormal findings in urine: Secondary | ICD-10-CM | POA: Diagnosis not present

## 2020-03-17 DIAGNOSIS — H3530 Unspecified macular degeneration: Secondary | ICD-10-CM | POA: Diagnosis not present

## 2020-03-17 DIAGNOSIS — N401 Enlarged prostate with lower urinary tract symptoms: Secondary | ICD-10-CM | POA: Diagnosis not present

## 2020-03-17 DIAGNOSIS — Z1331 Encounter for screening for depression: Secondary | ICD-10-CM | POA: Diagnosis not present

## 2020-03-17 DIAGNOSIS — E291 Testicular hypofunction: Secondary | ICD-10-CM | POA: Diagnosis not present

## 2020-03-17 DIAGNOSIS — Z0000 Encounter for general adult medical examination without abnormal findings: Secondary | ICD-10-CM | POA: Diagnosis not present

## 2020-03-28 DIAGNOSIS — E291 Testicular hypofunction: Secondary | ICD-10-CM | POA: Diagnosis not present

## 2020-03-30 DIAGNOSIS — N401 Enlarged prostate with lower urinary tract symptoms: Secondary | ICD-10-CM | POA: Diagnosis not present

## 2020-03-31 DIAGNOSIS — H353123 Nonexudative age-related macular degeneration, left eye, advanced atrophic without subfoveal involvement: Secondary | ICD-10-CM | POA: Diagnosis not present

## 2020-03-31 DIAGNOSIS — H353211 Exudative age-related macular degeneration, right eye, with active choroidal neovascularization: Secondary | ICD-10-CM | POA: Diagnosis not present

## 2020-03-31 DIAGNOSIS — H35033 Hypertensive retinopathy, bilateral: Secondary | ICD-10-CM | POA: Diagnosis not present

## 2020-03-31 DIAGNOSIS — H43813 Vitreous degeneration, bilateral: Secondary | ICD-10-CM | POA: Diagnosis not present

## 2020-04-06 DIAGNOSIS — R3914 Feeling of incomplete bladder emptying: Secondary | ICD-10-CM | POA: Diagnosis not present

## 2020-04-06 DIAGNOSIS — R972 Elevated prostate specific antigen [PSA]: Secondary | ICD-10-CM | POA: Diagnosis not present

## 2020-04-06 DIAGNOSIS — N401 Enlarged prostate with lower urinary tract symptoms: Secondary | ICD-10-CM | POA: Diagnosis not present

## 2020-04-06 DIAGNOSIS — N200 Calculus of kidney: Secondary | ICD-10-CM | POA: Diagnosis not present

## 2020-04-18 DIAGNOSIS — E291 Testicular hypofunction: Secondary | ICD-10-CM | POA: Diagnosis not present

## 2020-05-09 DIAGNOSIS — E291 Testicular hypofunction: Secondary | ICD-10-CM | POA: Diagnosis not present

## 2020-05-25 DIAGNOSIS — H43813 Vitreous degeneration, bilateral: Secondary | ICD-10-CM | POA: Diagnosis not present

## 2020-05-25 DIAGNOSIS — H353123 Nonexudative age-related macular degeneration, left eye, advanced atrophic without subfoveal involvement: Secondary | ICD-10-CM | POA: Diagnosis not present

## 2020-05-25 DIAGNOSIS — H353211 Exudative age-related macular degeneration, right eye, with active choroidal neovascularization: Secondary | ICD-10-CM | POA: Diagnosis not present

## 2020-05-25 DIAGNOSIS — E113293 Type 2 diabetes mellitus with mild nonproliferative diabetic retinopathy without macular edema, bilateral: Secondary | ICD-10-CM | POA: Diagnosis not present

## 2020-05-30 DIAGNOSIS — E291 Testicular hypofunction: Secondary | ICD-10-CM | POA: Diagnosis not present

## 2020-06-20 DIAGNOSIS — Z23 Encounter for immunization: Secondary | ICD-10-CM | POA: Diagnosis not present

## 2020-06-20 DIAGNOSIS — E291 Testicular hypofunction: Secondary | ICD-10-CM | POA: Diagnosis not present

## 2020-07-11 DIAGNOSIS — E291 Testicular hypofunction: Secondary | ICD-10-CM | POA: Diagnosis not present

## 2020-07-25 DIAGNOSIS — E785 Hyperlipidemia, unspecified: Secondary | ICD-10-CM | POA: Diagnosis not present

## 2020-07-25 DIAGNOSIS — R030 Elevated blood-pressure reading, without diagnosis of hypertension: Secondary | ICD-10-CM | POA: Diagnosis not present

## 2020-07-25 DIAGNOSIS — N184 Chronic kidney disease, stage 4 (severe): Secondary | ICD-10-CM | POA: Diagnosis not present

## 2020-07-25 DIAGNOSIS — E1122 Type 2 diabetes mellitus with diabetic chronic kidney disease: Secondary | ICD-10-CM | POA: Diagnosis not present

## 2020-08-01 DIAGNOSIS — E291 Testicular hypofunction: Secondary | ICD-10-CM | POA: Diagnosis not present

## 2020-08-03 DIAGNOSIS — H353123 Nonexudative age-related macular degeneration, left eye, advanced atrophic without subfoveal involvement: Secondary | ICD-10-CM | POA: Diagnosis not present

## 2020-08-03 DIAGNOSIS — H43813 Vitreous degeneration, bilateral: Secondary | ICD-10-CM | POA: Diagnosis not present

## 2020-08-03 DIAGNOSIS — E113293 Type 2 diabetes mellitus with mild nonproliferative diabetic retinopathy without macular edema, bilateral: Secondary | ICD-10-CM | POA: Diagnosis not present

## 2020-08-03 DIAGNOSIS — H353211 Exudative age-related macular degeneration, right eye, with active choroidal neovascularization: Secondary | ICD-10-CM | POA: Diagnosis not present

## 2020-08-22 DIAGNOSIS — E291 Testicular hypofunction: Secondary | ICD-10-CM | POA: Diagnosis not present

## 2020-09-12 DIAGNOSIS — E291 Testicular hypofunction: Secondary | ICD-10-CM | POA: Diagnosis not present

## 2020-10-03 DIAGNOSIS — E291 Testicular hypofunction: Secondary | ICD-10-CM | POA: Diagnosis not present

## 2020-10-23 DIAGNOSIS — R972 Elevated prostate specific antigen [PSA]: Secondary | ICD-10-CM | POA: Diagnosis not present

## 2020-10-24 DIAGNOSIS — E291 Testicular hypofunction: Secondary | ICD-10-CM | POA: Diagnosis not present

## 2020-10-25 DIAGNOSIS — H35033 Hypertensive retinopathy, bilateral: Secondary | ICD-10-CM | POA: Diagnosis not present

## 2020-10-25 DIAGNOSIS — H353123 Nonexudative age-related macular degeneration, left eye, advanced atrophic without subfoveal involvement: Secondary | ICD-10-CM | POA: Diagnosis not present

## 2020-10-25 DIAGNOSIS — H353211 Exudative age-related macular degeneration, right eye, with active choroidal neovascularization: Secondary | ICD-10-CM | POA: Diagnosis not present

## 2020-10-25 DIAGNOSIS — E113293 Type 2 diabetes mellitus with mild nonproliferative diabetic retinopathy without macular edema, bilateral: Secondary | ICD-10-CM | POA: Diagnosis not present

## 2020-10-30 DIAGNOSIS — N401 Enlarged prostate with lower urinary tract symptoms: Secondary | ICD-10-CM | POA: Diagnosis not present

## 2020-10-30 DIAGNOSIS — N200 Calculus of kidney: Secondary | ICD-10-CM | POA: Diagnosis not present

## 2020-10-30 DIAGNOSIS — R3914 Feeling of incomplete bladder emptying: Secondary | ICD-10-CM | POA: Diagnosis not present

## 2020-11-13 DIAGNOSIS — E291 Testicular hypofunction: Secondary | ICD-10-CM | POA: Diagnosis not present

## 2020-11-13 DIAGNOSIS — E1122 Type 2 diabetes mellitus with diabetic chronic kidney disease: Secondary | ICD-10-CM | POA: Diagnosis not present

## 2020-11-13 DIAGNOSIS — R030 Elevated blood-pressure reading, without diagnosis of hypertension: Secondary | ICD-10-CM | POA: Diagnosis not present

## 2020-11-13 DIAGNOSIS — N184 Chronic kidney disease, stage 4 (severe): Secondary | ICD-10-CM | POA: Diagnosis not present

## 2020-12-05 DIAGNOSIS — E291 Testicular hypofunction: Secondary | ICD-10-CM | POA: Diagnosis not present

## 2020-12-26 DIAGNOSIS — E291 Testicular hypofunction: Secondary | ICD-10-CM | POA: Diagnosis not present

## 2021-01-16 DIAGNOSIS — E291 Testicular hypofunction: Secondary | ICD-10-CM | POA: Diagnosis not present

## 2021-01-17 DIAGNOSIS — H353211 Exudative age-related macular degeneration, right eye, with active choroidal neovascularization: Secondary | ICD-10-CM | POA: Diagnosis not present

## 2021-01-17 DIAGNOSIS — H353123 Nonexudative age-related macular degeneration, left eye, advanced atrophic without subfoveal involvement: Secondary | ICD-10-CM | POA: Diagnosis not present

## 2021-02-06 DIAGNOSIS — E291 Testicular hypofunction: Secondary | ICD-10-CM | POA: Diagnosis not present

## 2021-03-06 DIAGNOSIS — E291 Testicular hypofunction: Secondary | ICD-10-CM | POA: Diagnosis not present

## 2021-03-09 DIAGNOSIS — N401 Enlarged prostate with lower urinary tract symptoms: Secondary | ICD-10-CM | POA: Diagnosis not present

## 2021-03-13 DIAGNOSIS — E039 Hypothyroidism, unspecified: Secondary | ICD-10-CM | POA: Diagnosis not present

## 2021-03-13 DIAGNOSIS — E1122 Type 2 diabetes mellitus with diabetic chronic kidney disease: Secondary | ICD-10-CM | POA: Diagnosis not present

## 2021-03-13 DIAGNOSIS — E785 Hyperlipidemia, unspecified: Secondary | ICD-10-CM | POA: Diagnosis not present

## 2021-03-13 DIAGNOSIS — Z125 Encounter for screening for malignant neoplasm of prostate: Secondary | ICD-10-CM | POA: Diagnosis not present

## 2021-03-13 DIAGNOSIS — E291 Testicular hypofunction: Secondary | ICD-10-CM | POA: Diagnosis not present

## 2021-03-20 DIAGNOSIS — R82998 Other abnormal findings in urine: Secondary | ICD-10-CM | POA: Diagnosis not present

## 2021-03-20 DIAGNOSIS — N184 Chronic kidney disease, stage 4 (severe): Secondary | ICD-10-CM | POA: Diagnosis not present

## 2021-03-20 DIAGNOSIS — Z0000 Encounter for general adult medical examination without abnormal findings: Secondary | ICD-10-CM | POA: Diagnosis not present

## 2021-03-20 DIAGNOSIS — R030 Elevated blood-pressure reading, without diagnosis of hypertension: Secondary | ICD-10-CM | POA: Diagnosis not present

## 2021-03-20 DIAGNOSIS — E1122 Type 2 diabetes mellitus with diabetic chronic kidney disease: Secondary | ICD-10-CM | POA: Diagnosis not present

## 2021-03-27 DIAGNOSIS — E291 Testicular hypofunction: Secondary | ICD-10-CM | POA: Diagnosis not present

## 2021-04-23 DIAGNOSIS — R3914 Feeling of incomplete bladder emptying: Secondary | ICD-10-CM | POA: Diagnosis not present

## 2021-04-24 DIAGNOSIS — E291 Testicular hypofunction: Secondary | ICD-10-CM | POA: Diagnosis not present

## 2021-04-25 DIAGNOSIS — H353211 Exudative age-related macular degeneration, right eye, with active choroidal neovascularization: Secondary | ICD-10-CM | POA: Diagnosis not present

## 2021-04-25 DIAGNOSIS — E113293 Type 2 diabetes mellitus with mild nonproliferative diabetic retinopathy without macular edema, bilateral: Secondary | ICD-10-CM | POA: Diagnosis not present

## 2021-04-25 DIAGNOSIS — H35033 Hypertensive retinopathy, bilateral: Secondary | ICD-10-CM | POA: Diagnosis not present

## 2021-04-25 DIAGNOSIS — H353123 Nonexudative age-related macular degeneration, left eye, advanced atrophic without subfoveal involvement: Secondary | ICD-10-CM | POA: Diagnosis not present

## 2021-04-30 DIAGNOSIS — R972 Elevated prostate specific antigen [PSA]: Secondary | ICD-10-CM | POA: Diagnosis not present

## 2021-04-30 DIAGNOSIS — R3914 Feeling of incomplete bladder emptying: Secondary | ICD-10-CM | POA: Diagnosis not present

## 2021-04-30 DIAGNOSIS — N200 Calculus of kidney: Secondary | ICD-10-CM | POA: Diagnosis not present

## 2021-05-04 DIAGNOSIS — H52203 Unspecified astigmatism, bilateral: Secondary | ICD-10-CM | POA: Diagnosis not present

## 2021-05-15 DIAGNOSIS — E291 Testicular hypofunction: Secondary | ICD-10-CM | POA: Diagnosis not present

## 2021-06-05 DIAGNOSIS — E291 Testicular hypofunction: Secondary | ICD-10-CM | POA: Diagnosis not present

## 2021-07-02 DIAGNOSIS — R3914 Feeling of incomplete bladder emptying: Secondary | ICD-10-CM | POA: Diagnosis not present

## 2021-07-02 DIAGNOSIS — N401 Enlarged prostate with lower urinary tract symptoms: Secondary | ICD-10-CM | POA: Diagnosis not present

## 2021-07-03 DIAGNOSIS — R339 Retention of urine, unspecified: Secondary | ICD-10-CM | POA: Diagnosis not present

## 2021-07-18 DIAGNOSIS — R3914 Feeling of incomplete bladder emptying: Secondary | ICD-10-CM | POA: Diagnosis not present

## 2021-07-18 DIAGNOSIS — E291 Testicular hypofunction: Secondary | ICD-10-CM | POA: Diagnosis not present

## 2021-07-23 DIAGNOSIS — R339 Retention of urine, unspecified: Secondary | ICD-10-CM | POA: Diagnosis not present

## 2021-07-23 DIAGNOSIS — R3914 Feeling of incomplete bladder emptying: Secondary | ICD-10-CM | POA: Diagnosis not present

## 2021-07-23 DIAGNOSIS — N401 Enlarged prostate with lower urinary tract symptoms: Secondary | ICD-10-CM | POA: Diagnosis not present

## 2021-07-23 DIAGNOSIS — N3000 Acute cystitis without hematuria: Secondary | ICD-10-CM | POA: Diagnosis not present

## 2021-08-02 DIAGNOSIS — H353211 Exudative age-related macular degeneration, right eye, with active choroidal neovascularization: Secondary | ICD-10-CM | POA: Diagnosis not present

## 2021-08-02 DIAGNOSIS — H353123 Nonexudative age-related macular degeneration, left eye, advanced atrophic without subfoveal involvement: Secondary | ICD-10-CM | POA: Diagnosis not present

## 2021-08-02 DIAGNOSIS — H43813 Vitreous degeneration, bilateral: Secondary | ICD-10-CM | POA: Diagnosis not present

## 2021-08-02 DIAGNOSIS — E113293 Type 2 diabetes mellitus with mild nonproliferative diabetic retinopathy without macular edema, bilateral: Secondary | ICD-10-CM | POA: Diagnosis not present

## 2021-08-08 DIAGNOSIS — E291 Testicular hypofunction: Secondary | ICD-10-CM | POA: Diagnosis not present

## 2021-08-28 DIAGNOSIS — E291 Testicular hypofunction: Secondary | ICD-10-CM | POA: Diagnosis not present

## 2021-09-18 DIAGNOSIS — E291 Testicular hypofunction: Secondary | ICD-10-CM | POA: Diagnosis not present

## 2021-09-28 DIAGNOSIS — E039 Hypothyroidism, unspecified: Secondary | ICD-10-CM | POA: Diagnosis not present

## 2021-09-28 DIAGNOSIS — E1122 Type 2 diabetes mellitus with diabetic chronic kidney disease: Secondary | ICD-10-CM | POA: Diagnosis not present

## 2021-09-28 DIAGNOSIS — N184 Chronic kidney disease, stage 4 (severe): Secondary | ICD-10-CM | POA: Diagnosis not present

## 2021-09-28 DIAGNOSIS — R030 Elevated blood-pressure reading, without diagnosis of hypertension: Secondary | ICD-10-CM | POA: Diagnosis not present

## 2021-09-28 DIAGNOSIS — E291 Testicular hypofunction: Secondary | ICD-10-CM | POA: Diagnosis not present

## 2021-10-09 DIAGNOSIS — E291 Testicular hypofunction: Secondary | ICD-10-CM | POA: Diagnosis not present

## 2021-10-30 DIAGNOSIS — E291 Testicular hypofunction: Secondary | ICD-10-CM | POA: Diagnosis not present

## 2021-11-08 DIAGNOSIS — H353211 Exudative age-related macular degeneration, right eye, with active choroidal neovascularization: Secondary | ICD-10-CM | POA: Diagnosis not present

## 2021-11-08 DIAGNOSIS — H353123 Nonexudative age-related macular degeneration, left eye, advanced atrophic without subfoveal involvement: Secondary | ICD-10-CM | POA: Diagnosis not present

## 2021-11-08 DIAGNOSIS — E113293 Type 2 diabetes mellitus with mild nonproliferative diabetic retinopathy without macular edema, bilateral: Secondary | ICD-10-CM | POA: Diagnosis not present

## 2021-11-08 DIAGNOSIS — H35033 Hypertensive retinopathy, bilateral: Secondary | ICD-10-CM | POA: Diagnosis not present

## 2021-11-27 DIAGNOSIS — E291 Testicular hypofunction: Secondary | ICD-10-CM | POA: Diagnosis not present

## 2021-12-25 DIAGNOSIS — E291 Testicular hypofunction: Secondary | ICD-10-CM | POA: Diagnosis not present

## 2022-01-04 ENCOUNTER — Encounter (HOSPITAL_COMMUNITY): Payer: Self-pay | Admitting: Emergency Medicine

## 2022-01-04 ENCOUNTER — Ambulatory Visit (HOSPITAL_COMMUNITY)
Admission: EM | Admit: 2022-01-04 | Discharge: 2022-01-04 | Disposition: A | Discharge status: Home / Self Care | Payer: Medicare Other | Attending: Internal Medicine | Admitting: Internal Medicine

## 2022-01-04 DIAGNOSIS — R233 Spontaneous ecchymoses: Secondary | ICD-10-CM | POA: Diagnosis not present

## 2022-01-04 DIAGNOSIS — D1801 Hemangioma of skin and subcutaneous tissue: Secondary | ICD-10-CM | POA: Diagnosis not present

## 2022-01-04 DIAGNOSIS — T148XXA Other injury of unspecified body region, initial encounter: Secondary | ICD-10-CM | POA: Diagnosis not present

## 2022-01-04 DIAGNOSIS — I96 Gangrene, not elsewhere classified: Secondary | ICD-10-CM | POA: Diagnosis not present

## 2022-01-04 MED ORDER — BACITRACIN-NEOMYCIN-POLYMYXIN 400-5-5000 EX OINT: 1.0000 "application " | TOPICAL_OINTMENT | Freq: Two times a day (BID) | CUTANEOUS | 0 refills | Status: DC

## 2022-01-04 MED ORDER — SILVER NITRATE-POT NITRATE 75-25 % EX MISC
CUTANEOUS | Status: AC
  Filled 2022-01-04: qty 10

## 2022-01-04 NOTE — Discharge Instructions (Addendum)
Daily wound dressing changes with antibiotic ointment ?It is okay to wash with soap and water after 48 to 72 hours of occlusive dressing. ?Stop occlusive dressing after 2 days ?Return to urgent care if you observe redness, swelling, worsening pain or drainage. ?

## 2022-01-04 NOTE — ED Provider Notes (Signed)
?[facility] ? ? ? ?CSN: [id number] ?Arrival date & time: 01/04/22  1539 ? ? ?  ? ?History   ?Chief Complaint ?Chief Complaint  ?Patient presents with  ? Abscess  ? ? ?HPI ?Jorge Butler is a 88 y.o. male comes to the urgent care with bleeding following trauma to the right forearm.  Patient believes that he hit his arm against an object resulting in avulsion of a pre-existing skin tag.  Patient had a fair amount of bleeding from the area.  [facility] where the involved skin was bandaged and he was advised to come to the urgent care.  Bleeding is somewhat controlled.  He denies any pain.  No history of skin cancer.  No surrounding redness or fever. ?HPI ? ?History reviewed. No pertinent past medical history. ? ?There are no problems to display for this patient. ? ? ?History reviewed. No pertinent surgical history. ? ? ? ? ?Home Medications   ? ?Prior to Admission medications   ?Medication Sig Start Date End Date Taking? Authorizing Provider  ?neomycin-bacitracin-polymyxin (NEOSPORIN) ointment Apply 1 application. topically every 12 (twelve) hours. 01/04/22  Yes , Britta Mccreedy, MD  ? ? ?Family History ?History reviewed. No pertinent family history. ? ?Social History ?Social History  ? ?Tobacco Use  ? Smoking status: Never  ? Smokeless tobacco: Never  ? ? ? ?Allergies   ?Patient has no allergy information on record. ? ? ?Review of Systems ?Review of Systems  ?Musculoskeletal: Negative.   ?Skin:  Positive for color change and wound.  ?Neurological: Negative.   ? ? ?Physical Exam ?Triage Vital Signs ?ED Triage Vitals  ?Enc Vitals Group  ?   BP 01/04/22 1700 (!) 154/64  ?   Pulse Rate 01/04/22 1700 98  ?   Resp 01/04/22 1700 17  ?   Temp --   ?   Temp src --   ?   SpO2 01/04/22 1700 96 %  ?   Weight 01/04/22 1659 170 lb (77.1 kg)  ?   Height 01/04/22 1659 5\' 9"  (1.753 m)  ?   Head Circumference --   ?   Peak Flow --   ?   Pain Score 01/04/22 1659 0  ?   Pain Loc --   ?   Pain Edu? --   ?    Excl. in GC? --   ? ?No data found. ? ?Updated Vital Signs ?BP (!) 154/64 (BP Location: Right Arm)   Pulse 98   Resp 17   Ht 5\' 9"  (1.753 m)   Wt 77.1 kg   SpO2 96%   BMI 25.10 kg/m?  ? ?Visual Acuity ?Right Eye Distance:   ?Left Eye Distance:   ?Bilateral Distance:   ? ?Right Eye Near:   ?Left Eye Near:    ?Bilateral Near:    ? ?Physical Exam ?Vitals and nursing note reviewed.  ?Constitutional:   ?   General: He is not in acute distress. ?   Appearance: He is not ill-appearing.  ?Cardiovascular:  ?   Rate and Rhythm: Normal rate and regular rhythm.  ?Musculoskeletal:     ?   General: Normal range of motion.  ?Skin: ?   Comments: Dusky looking skin tag.  It is firm in consistency and measures about 0.5 cm in the longest diameter.  The underlying skin ulceration is circular in nature with a slightly rolled up edge.  The skin ulceration bleeds easily.  ?Neurological:  ?  Mental Status: He is alert.  ? ? ? ?UC Treatments / Results  ?Labs ?(all labs ordered are listed, but only abnormal results are displayed) ?Labs Reviewed  ?SURGICAL PATHOLOGY  ? ? ?EKG ? ? ?Radiology ?No results found. ? ?Procedures ?Procedures (including critical care time) ? ?Medications Ordered in UC ?Medications - No data to display ? ?Initial Impression / Assessment and Plan / UC Course  ?I have reviewed the triage vital signs and the nursing notes. ? ?Pertinent labs & imaging results that were available during my care of the patient were reviewed by me and considered in my medical decision making (see chart for details). ? ?  ? ?1.  Traumatic skin ulceration: ?Skin tag was excised.  Bleeding was controlled with silver nitrate stick ?Wound care recommendations given to the patient ?Skin tag will be sent to pathology for evaluation. ?Return precautions given. ?Final Clinical Impressions(s) / UC Diagnoses  ? ?Final diagnoses:  ?Avulsion of skin  ? ? ? ?Discharge Instructions   ? ?  ?Daily wound dressing changes with antibiotic ointment ?It  is okay to wash with soap and water after 48 to 72 hours of occlusive dressing. ?Stop occlusive dressing after 2 days ?Return to urgent care if you observe redness, swelling, worsening pain or drainage. ? ? ?ED Prescriptions   ? ? Medication Sig Dispense Auth. Provider  ? neomycin-bacitracin-polymyxin (NEOSPORIN) ointment Apply 1 application. topically every 12 (twelve) hours. 15 g , Britta Mccreedy, MD  ? ?  ? ?PDMP not reviewed this encounter. ?  ?Merrilee Jansky, MD ?01/04/22 1813 ? ?

## 2022-01-04 NOTE — ED Triage Notes (Signed)
Pt reports an abscess on the right lower arm for a few weeks. States the abscess ruptured last night.  ?

## 2022-01-08 LAB — SURGICAL PATHOLOGY

## 2022-01-15 DIAGNOSIS — E291 Testicular hypofunction: Secondary | ICD-10-CM | POA: Diagnosis not present

## 2022-02-06 DIAGNOSIS — E291 Testicular hypofunction: Secondary | ICD-10-CM | POA: Diagnosis not present

## 2022-02-21 DIAGNOSIS — H353211 Exudative age-related macular degeneration, right eye, with active choroidal neovascularization: Secondary | ICD-10-CM | POA: Diagnosis not present

## 2022-02-21 DIAGNOSIS — H353123 Nonexudative age-related macular degeneration, left eye, advanced atrophic without subfoveal involvement: Secondary | ICD-10-CM | POA: Diagnosis not present

## 2022-02-21 DIAGNOSIS — E113293 Type 2 diabetes mellitus with mild nonproliferative diabetic retinopathy without macular edema, bilateral: Secondary | ICD-10-CM | POA: Diagnosis not present

## 2022-02-26 DIAGNOSIS — E291 Testicular hypofunction: Secondary | ICD-10-CM | POA: Diagnosis not present

## 2022-03-19 DIAGNOSIS — E291 Testicular hypofunction: Secondary | ICD-10-CM | POA: Diagnosis not present

## 2022-03-21 DIAGNOSIS — E039 Hypothyroidism, unspecified: Secondary | ICD-10-CM | POA: Diagnosis not present

## 2022-03-21 DIAGNOSIS — E291 Testicular hypofunction: Secondary | ICD-10-CM | POA: Diagnosis not present

## 2022-03-21 DIAGNOSIS — R7989 Other specified abnormal findings of blood chemistry: Secondary | ICD-10-CM | POA: Diagnosis not present

## 2022-03-21 DIAGNOSIS — Z125 Encounter for screening for malignant neoplasm of prostate: Secondary | ICD-10-CM | POA: Diagnosis not present

## 2022-03-21 DIAGNOSIS — E785 Hyperlipidemia, unspecified: Secondary | ICD-10-CM | POA: Diagnosis not present

## 2022-03-28 DIAGNOSIS — R82998 Other abnormal findings in urine: Secondary | ICD-10-CM | POA: Diagnosis not present

## 2022-03-28 DIAGNOSIS — E1122 Type 2 diabetes mellitus with diabetic chronic kidney disease: Secondary | ICD-10-CM | POA: Diagnosis not present

## 2022-03-28 DIAGNOSIS — Z0000 Encounter for general adult medical examination without abnormal findings: Secondary | ICD-10-CM | POA: Diagnosis not present

## 2022-03-28 DIAGNOSIS — N184 Chronic kidney disease, stage 4 (severe): Secondary | ICD-10-CM | POA: Diagnosis not present

## 2022-03-28 DIAGNOSIS — E291 Testicular hypofunction: Secondary | ICD-10-CM | POA: Diagnosis not present

## 2022-04-09 DIAGNOSIS — E785 Hyperlipidemia, unspecified: Secondary | ICD-10-CM | POA: Diagnosis not present

## 2022-04-09 DIAGNOSIS — E291 Testicular hypofunction: Secondary | ICD-10-CM | POA: Diagnosis not present

## 2022-04-23 DIAGNOSIS — R972 Elevated prostate specific antigen [PSA]: Secondary | ICD-10-CM | POA: Diagnosis not present

## 2022-04-23 DIAGNOSIS — N183 Chronic kidney disease, stage 3 unspecified: Secondary | ICD-10-CM | POA: Diagnosis not present

## 2022-04-29 DIAGNOSIS — N200 Calculus of kidney: Secondary | ICD-10-CM | POA: Diagnosis not present

## 2022-04-29 DIAGNOSIS — N401 Enlarged prostate with lower urinary tract symptoms: Secondary | ICD-10-CM | POA: Diagnosis not present

## 2022-04-29 DIAGNOSIS — R3914 Feeling of incomplete bladder emptying: Secondary | ICD-10-CM | POA: Diagnosis not present

## 2022-04-30 DIAGNOSIS — E291 Testicular hypofunction: Secondary | ICD-10-CM | POA: Diagnosis not present

## 2022-05-07 DIAGNOSIS — H52203 Unspecified astigmatism, bilateral: Secondary | ICD-10-CM | POA: Diagnosis not present

## 2022-05-21 DIAGNOSIS — E291 Testicular hypofunction: Secondary | ICD-10-CM | POA: Diagnosis not present

## 2022-06-11 DIAGNOSIS — E291 Testicular hypofunction: Secondary | ICD-10-CM | POA: Diagnosis not present

## 2022-06-27 DIAGNOSIS — H353211 Exudative age-related macular degeneration, right eye, with active choroidal neovascularization: Secondary | ICD-10-CM | POA: Diagnosis not present

## 2022-06-27 DIAGNOSIS — H43813 Vitreous degeneration, bilateral: Secondary | ICD-10-CM | POA: Diagnosis not present

## 2022-06-27 DIAGNOSIS — H353123 Nonexudative age-related macular degeneration, left eye, advanced atrophic without subfoveal involvement: Secondary | ICD-10-CM | POA: Diagnosis not present

## 2022-06-27 DIAGNOSIS — E113293 Type 2 diabetes mellitus with mild nonproliferative diabetic retinopathy without macular edema, bilateral: Secondary | ICD-10-CM | POA: Diagnosis not present

## 2022-07-02 DIAGNOSIS — E291 Testicular hypofunction: Secondary | ICD-10-CM | POA: Diagnosis not present

## 2022-07-23 DIAGNOSIS — E291 Testicular hypofunction: Secondary | ICD-10-CM | POA: Diagnosis not present

## 2022-08-13 DIAGNOSIS — E291 Testicular hypofunction: Secondary | ICD-10-CM | POA: Diagnosis not present

## 2022-09-03 DIAGNOSIS — E291 Testicular hypofunction: Secondary | ICD-10-CM | POA: Diagnosis not present

## 2022-09-24 DIAGNOSIS — E291 Testicular hypofunction: Secondary | ICD-10-CM | POA: Diagnosis not present

## 2022-10-01 DIAGNOSIS — E1122 Type 2 diabetes mellitus with diabetic chronic kidney disease: Secondary | ICD-10-CM | POA: Diagnosis not present

## 2022-10-15 DIAGNOSIS — E291 Testicular hypofunction: Secondary | ICD-10-CM | POA: Diagnosis not present

## 2022-10-31 DIAGNOSIS — H353211 Exudative age-related macular degeneration, right eye, with active choroidal neovascularization: Secondary | ICD-10-CM | POA: Diagnosis not present

## 2022-10-31 DIAGNOSIS — H353123 Nonexudative age-related macular degeneration, left eye, advanced atrophic without subfoveal involvement: Secondary | ICD-10-CM | POA: Diagnosis not present

## 2022-10-31 DIAGNOSIS — H35033 Hypertensive retinopathy, bilateral: Secondary | ICD-10-CM | POA: Diagnosis not present

## 2022-10-31 DIAGNOSIS — H43393 Other vitreous opacities, bilateral: Secondary | ICD-10-CM | POA: Diagnosis not present

## 2022-11-05 DIAGNOSIS — E291 Testicular hypofunction: Secondary | ICD-10-CM | POA: Diagnosis not present

## 2022-11-28 DIAGNOSIS — E291 Testicular hypofunction: Secondary | ICD-10-CM | POA: Diagnosis not present

## 2022-12-17 DIAGNOSIS — E291 Testicular hypofunction: Secondary | ICD-10-CM | POA: Diagnosis not present

## 2023-01-07 DIAGNOSIS — E291 Testicular hypofunction: Secondary | ICD-10-CM | POA: Diagnosis not present

## 2023-01-29 DIAGNOSIS — E291 Testicular hypofunction: Secondary | ICD-10-CM | POA: Diagnosis not present

## 2023-02-18 DIAGNOSIS — E291 Testicular hypofunction: Secondary | ICD-10-CM | POA: Diagnosis not present

## 2023-03-06 DIAGNOSIS — H43393 Other vitreous opacities, bilateral: Secondary | ICD-10-CM | POA: Diagnosis not present

## 2023-03-06 DIAGNOSIS — H353123 Nonexudative age-related macular degeneration, left eye, advanced atrophic without subfoveal involvement: Secondary | ICD-10-CM | POA: Diagnosis not present

## 2023-03-06 DIAGNOSIS — H353211 Exudative age-related macular degeneration, right eye, with active choroidal neovascularization: Secondary | ICD-10-CM | POA: Diagnosis not present

## 2023-03-06 DIAGNOSIS — H43813 Vitreous degeneration, bilateral: Secondary | ICD-10-CM | POA: Diagnosis not present

## 2023-03-06 DIAGNOSIS — E113293 Type 2 diabetes mellitus with mild nonproliferative diabetic retinopathy without macular edema, bilateral: Secondary | ICD-10-CM | POA: Diagnosis not present

## 2023-03-11 DIAGNOSIS — E291 Testicular hypofunction: Secondary | ICD-10-CM | POA: Diagnosis not present

## 2023-04-01 DIAGNOSIS — E291 Testicular hypofunction: Secondary | ICD-10-CM | POA: Diagnosis not present

## 2023-04-04 DIAGNOSIS — N401 Enlarged prostate with lower urinary tract symptoms: Secondary | ICD-10-CM | POA: Diagnosis not present

## 2023-04-04 DIAGNOSIS — E291 Testicular hypofunction: Secondary | ICD-10-CM | POA: Diagnosis not present

## 2023-04-04 DIAGNOSIS — E1122 Type 2 diabetes mellitus with diabetic chronic kidney disease: Secondary | ICD-10-CM | POA: Diagnosis not present

## 2023-04-04 DIAGNOSIS — E039 Hypothyroidism, unspecified: Secondary | ICD-10-CM | POA: Diagnosis not present

## 2023-04-11 DIAGNOSIS — R82998 Other abnormal findings in urine: Secondary | ICD-10-CM | POA: Diagnosis not present

## 2023-04-11 DIAGNOSIS — Z23 Encounter for immunization: Secondary | ICD-10-CM | POA: Diagnosis not present

## 2023-04-11 DIAGNOSIS — Z0000 Encounter for general adult medical examination without abnormal findings: Secondary | ICD-10-CM | POA: Diagnosis not present

## 2023-04-11 DIAGNOSIS — E1122 Type 2 diabetes mellitus with diabetic chronic kidney disease: Secondary | ICD-10-CM | POA: Diagnosis not present

## 2023-04-11 DIAGNOSIS — Z1331 Encounter for screening for depression: Secondary | ICD-10-CM | POA: Diagnosis not present

## 2023-04-16 ENCOUNTER — Inpatient Hospital Stay (HOSPITAL_COMMUNITY)
Admission: EM | Admit: 2023-04-16 | Discharge: 2023-04-19 | DRG: 683 | Disposition: A | Discharge status: Home / Self Care | Payer: Medicare Other | Attending: Internal Medicine | Admitting: Internal Medicine

## 2023-04-16 ENCOUNTER — Encounter (HOSPITAL_COMMUNITY): Payer: Self-pay | Admitting: Emergency Medicine

## 2023-04-16 ENCOUNTER — Ambulatory Visit (INDEPENDENT_AMBULATORY_CARE_PROVIDER_SITE_OTHER): Payer: Medicare Other

## 2023-04-16 ENCOUNTER — Ambulatory Visit (HOSPITAL_COMMUNITY)
Admission: EM | Admit: 2023-04-16 | Discharge: 2023-04-16 | Disposition: A | Discharge status: Home / Self Care | Payer: Medicare Other

## 2023-04-16 ENCOUNTER — Emergency Department (HOSPITAL_COMMUNITY): Payer: Medicare Other

## 2023-04-16 ENCOUNTER — Other Ambulatory Visit: Payer: Self-pay

## 2023-04-16 DIAGNOSIS — I129 Hypertensive chronic kidney disease with stage 1 through stage 4 chronic kidney disease, or unspecified chronic kidney disease: Secondary | ICD-10-CM | POA: Diagnosis present

## 2023-04-16 DIAGNOSIS — E875 Hyperkalemia: Secondary | ICD-10-CM | POA: Diagnosis present

## 2023-04-16 DIAGNOSIS — N400 Enlarged prostate without lower urinary tract symptoms: Secondary | ICD-10-CM | POA: Diagnosis not present

## 2023-04-16 DIAGNOSIS — Z8673 Personal history of transient ischemic attack (TIA), and cerebral infarction without residual deficits: Secondary | ICD-10-CM | POA: Insufficient documentation

## 2023-04-16 DIAGNOSIS — Z794 Long term (current) use of insulin: Secondary | ICD-10-CM

## 2023-04-16 DIAGNOSIS — R739 Hyperglycemia, unspecified: Secondary | ICD-10-CM

## 2023-04-16 DIAGNOSIS — K5900 Constipation, unspecified: Secondary | ICD-10-CM | POA: Diagnosis present

## 2023-04-16 DIAGNOSIS — R631 Polydipsia: Secondary | ICD-10-CM | POA: Diagnosis not present

## 2023-04-16 DIAGNOSIS — Z825 Family history of asthma and other chronic lower respiratory diseases: Secondary | ICD-10-CM | POA: Diagnosis not present

## 2023-04-16 DIAGNOSIS — E039 Hypothyroidism, unspecified: Secondary | ICD-10-CM | POA: Diagnosis present

## 2023-04-16 DIAGNOSIS — E441 Mild protein-calorie malnutrition: Secondary | ICD-10-CM | POA: Diagnosis not present

## 2023-04-16 DIAGNOSIS — Z8249 Family history of ischemic heart disease and other diseases of the circulatory system: Secondary | ICD-10-CM | POA: Diagnosis not present

## 2023-04-16 DIAGNOSIS — E1165 Type 2 diabetes mellitus with hyperglycemia: Secondary | ICD-10-CM | POA: Diagnosis not present

## 2023-04-16 DIAGNOSIS — E7800 Pure hypercholesterolemia, unspecified: Secondary | ICD-10-CM | POA: Diagnosis not present

## 2023-04-16 DIAGNOSIS — Z79890 Hormone replacement therapy (postmenopausal): Secondary | ICD-10-CM | POA: Diagnosis not present

## 2023-04-16 DIAGNOSIS — Z882 Allergy status to sulfonamides status: Secondary | ICD-10-CM | POA: Diagnosis not present

## 2023-04-16 DIAGNOSIS — H409 Unspecified glaucoma: Secondary | ICD-10-CM | POA: Diagnosis not present

## 2023-04-16 DIAGNOSIS — E785 Hyperlipidemia, unspecified: Secondary | ICD-10-CM | POA: Diagnosis present

## 2023-04-16 DIAGNOSIS — R2689 Other abnormalities of gait and mobility: Secondary | ICD-10-CM | POA: Insufficient documentation

## 2023-04-16 DIAGNOSIS — E860 Dehydration: Secondary | ICD-10-CM | POA: Diagnosis not present

## 2023-04-16 DIAGNOSIS — K551 Chronic vascular disorders of intestine: Secondary | ICD-10-CM | POA: Diagnosis not present

## 2023-04-16 DIAGNOSIS — R17 Unspecified jaundice: Secondary | ICD-10-CM | POA: Diagnosis present

## 2023-04-16 DIAGNOSIS — R109 Unspecified abdominal pain: Secondary | ICD-10-CM | POA: Diagnosis not present

## 2023-04-16 DIAGNOSIS — E871 Hypo-osmolality and hyponatremia: Secondary | ICD-10-CM | POA: Diagnosis not present

## 2023-04-16 DIAGNOSIS — Z7984 Long term (current) use of oral hypoglycemic drugs: Secondary | ICD-10-CM

## 2023-04-16 DIAGNOSIS — R1084 Generalized abdominal pain: Secondary | ICD-10-CM

## 2023-04-16 DIAGNOSIS — N1832 Chronic kidney disease, stage 3b: Secondary | ICD-10-CM | POA: Diagnosis not present

## 2023-04-16 DIAGNOSIS — K5730 Diverticulosis of large intestine without perforation or abscess without bleeding: Secondary | ICD-10-CM | POA: Diagnosis not present

## 2023-04-16 DIAGNOSIS — R351 Nocturia: Secondary | ICD-10-CM

## 2023-04-16 DIAGNOSIS — I639 Cerebral infarction, unspecified: Secondary | ICD-10-CM | POA: Insufficient documentation

## 2023-04-16 DIAGNOSIS — E230 Hypopituitarism: Secondary | ICD-10-CM | POA: Insufficient documentation

## 2023-04-16 DIAGNOSIS — R7989 Other specified abnormal findings of blood chemistry: Secondary | ICD-10-CM | POA: Diagnosis present

## 2023-04-16 DIAGNOSIS — R972 Elevated prostate specific antigen [PSA]: Secondary | ICD-10-CM | POA: Diagnosis present

## 2023-04-16 DIAGNOSIS — Z79899 Other long term (current) drug therapy: Secondary | ICD-10-CM

## 2023-04-16 DIAGNOSIS — R188 Other ascites: Secondary | ICD-10-CM | POA: Diagnosis not present

## 2023-04-16 DIAGNOSIS — E1122 Type 2 diabetes mellitus with diabetic chronic kidney disease: Secondary | ICD-10-CM | POA: Diagnosis not present

## 2023-04-16 DIAGNOSIS — G4700 Insomnia, unspecified: Secondary | ICD-10-CM

## 2023-04-16 DIAGNOSIS — Z6825 Body mass index (BMI) 25.0-25.9, adult: Secondary | ICD-10-CM

## 2023-04-16 DIAGNOSIS — I10 Essential (primary) hypertension: Secondary | ICD-10-CM | POA: Diagnosis present

## 2023-04-16 DIAGNOSIS — K6389 Other specified diseases of intestine: Secondary | ICD-10-CM | POA: Diagnosis present

## 2023-04-16 DIAGNOSIS — N179 Acute kidney failure, unspecified: Principal | ICD-10-CM | POA: Diagnosis present

## 2023-04-16 DIAGNOSIS — R000 Tachycardia, unspecified: Secondary | ICD-10-CM | POA: Diagnosis not present

## 2023-04-16 DIAGNOSIS — K559 Vascular disorder of intestine, unspecified: Principal | ICD-10-CM

## 2023-04-16 HISTORY — DX: Benign prostatic hyperplasia without lower urinary tract symptoms: N40.0

## 2023-04-16 HISTORY — DX: Pure hypercholesterolemia, unspecified: E78.00

## 2023-04-16 HISTORY — DX: Disorder of thyroid, unspecified: E07.9

## 2023-04-16 HISTORY — DX: Type 2 diabetes mellitus without complications: E11.9

## 2023-04-16 LAB — I-STAT CHEM 8, ED
BUN: 48 mg/dL — ABNORMAL HIGH (ref 8–23)
Calcium, Ion: 1.13 mmol/L — ABNORMAL LOW (ref 1.15–1.40)
Chloride: 96 mmol/L — ABNORMAL LOW (ref 98–111)
Creatinine, Ser: 2.3 mg/dL — ABNORMAL HIGH (ref 0.61–1.24)
Glucose, Bld: 435 mg/dL — ABNORMAL HIGH (ref 70–99)
HCT: 44 % (ref 39.0–52.0)
Hemoglobin: 15 g/dL (ref 13.0–17.0)
Potassium: 4.8 mmol/L (ref 3.5–5.1)
Sodium: 128 mmol/L — ABNORMAL LOW (ref 135–145)
TCO2: 23 mmol/L (ref 22–32)

## 2023-04-16 LAB — POCT FASTING CBG KUC MANUAL ENTRY: POCT Glucose (KUC): 454 mg/dL — AB (ref 70–99)

## 2023-04-16 LAB — I-STAT CG4 LACTIC ACID, ED
Lactic Acid, Venous: 2.5 mmol/L (ref 0.5–1.9)
Lactic Acid, Venous: 1.9 mmol/L (ref 0.5–1.9)

## 2023-04-16 LAB — HEMOGLOBIN A1C
Hgb A1c MFr Bld: 8 % — ABNORMAL HIGH (ref 4.8–5.6)
Mean Plasma Glucose: 182.9 mg/dL

## 2023-04-16 LAB — POCT URINALYSIS DIP (MANUAL ENTRY)
Bilirubin, UA: NEGATIVE
Blood, UA: NEGATIVE
Glucose, UA: >=1000 mg/dL — AB
Leukocytes, UA: NEGATIVE
Nitrite, UA: NEGATIVE
Protein Ur, POC: 100 mg/dL — AB
Spec Grav, UA: 1.015 (ref 1.010–1.025)
Urobilinogen, UA: 0.2 E.U./dL
pH, UA: 7 (ref 5.0–8.0)

## 2023-04-16 LAB — URINALYSIS, ROUTINE W REFLEX MICROSCOPIC
Bacteria, UA: NONE SEEN
Bilirubin Urine: NEGATIVE
Glucose, UA: >=500 mg/dL — AB
Hgb urine dipstick: NEGATIVE
Ketones, ur: 5 mg/dL — AB
Leukocytes,Ua: NEGATIVE
Nitrite: NEGATIVE
Protein, ur: 30 mg/dL — AB
Specific Gravity, Urine: 1.018 (ref 1.005–1.030)
pH: 6 (ref 5.0–8.0)

## 2023-04-16 LAB — BLOOD GAS, VENOUS
Acid-base deficit: 2.9 mmol/L — ABNORMAL HIGH (ref 0.0–2.0)
Bicarbonate: 22 mmol/L (ref 20.0–28.0)
Drawn by: 68028
O2 Saturation: 93.3 %
Patient temperature: 37
pCO2, Ven: 38 mmHg — ABNORMAL LOW (ref 44–60)
pH, Ven: 7.37 (ref 7.25–7.43)
pO2, Ven: 68 mmHg — ABNORMAL HIGH (ref 32–45)

## 2023-04-16 LAB — COMPREHENSIVE METABOLIC PANEL
ALT: 19 U/L (ref 0–44)
AST: 29 U/L (ref 15–41)
Albumin: 3.4 g/dL — ABNORMAL LOW (ref 3.5–5.0)
Alkaline Phosphatase: 66 U/L (ref 38–126)
Anion gap: 13 (ref 5–15)
BUN: 45 mg/dL — ABNORMAL HIGH (ref 8–23)
CO2: 19 mmol/L — ABNORMAL LOW (ref 22–32)
Calcium: 8.7 mg/dL — ABNORMAL LOW (ref 8.9–10.3)
Chloride: 94 mmol/L — ABNORMAL LOW (ref 98–111)
Creatinine, Ser: 2.3 mg/dL — ABNORMAL HIGH (ref 0.61–1.24)
GFR, Estimated: 26 mL/min — ABNORMAL LOW (ref >60)
Glucose, Bld: 438 mg/dL — ABNORMAL HIGH (ref 70–99)
Potassium: 5.3 mmol/L — ABNORMAL HIGH (ref 3.5–5.1)
Sodium: 126 mmol/L — ABNORMAL LOW (ref 135–145)
Total Bilirubin: 1.6 mg/dL — ABNORMAL HIGH (ref 0.3–1.2)
Total Protein: 6.9 g/dL (ref 6.5–8.1)

## 2023-04-16 LAB — CBC WITH DIFFERENTIAL/PLATELET
Abs Immature Granulocytes: 0.06 10*3/uL (ref 0.00–0.07)
Basophils Absolute: 0 10*3/uL (ref 0.0–0.1)
Basophils Relative: 0 %
Eosinophils Absolute: 0 10*3/uL (ref 0.0–0.5)
Eosinophils Relative: 0 %
HCT: 40.6 % (ref 39.0–52.0)
Hemoglobin: 13.2 g/dL (ref 13.0–17.0)
Immature Granulocytes: 1 %
Lymphocytes Relative: 5 %
Lymphs Abs: 0.7 10*3/uL (ref 0.7–4.0)
MCH: 30.1 pg (ref 26.0–34.0)
MCHC: 32.5 g/dL (ref 30.0–36.0)
MCV: 92.7 fL (ref 80.0–100.0)
Monocytes Absolute: 1.2 10*3/uL — ABNORMAL HIGH (ref 0.1–1.0)
Monocytes Relative: 10 %
Neutro Abs: 10.4 10*3/uL — ABNORMAL HIGH (ref 1.7–7.7)
Neutrophils Relative %: 84 %
Platelets: 211 10*3/uL (ref 150–400)
RBC: 4.38 MIL/uL (ref 4.22–5.81)
RDW: 13.9 % (ref 11.5–15.5)
WBC: 12.4 10*3/uL — ABNORMAL HIGH (ref 4.0–10.5)
nRBC: 0 % (ref 0.0–0.2)

## 2023-04-16 LAB — BASIC METABOLIC PANEL
Anion gap: 10 (ref 5–15)
BUN: 43 mg/dL — ABNORMAL HIGH (ref 8–23)
CO2: 23 mmol/L (ref 22–32)
Calcium: 8.7 mg/dL — ABNORMAL LOW (ref 8.9–10.3)
Chloride: 99 mmol/L (ref 98–111)
Creatinine, Ser: 2.07 mg/dL — ABNORMAL HIGH (ref 0.61–1.24)
GFR, Estimated: 30 mL/min — ABNORMAL LOW (ref >60)
Glucose, Bld: 227 mg/dL — ABNORMAL HIGH (ref 70–99)
Potassium: 4.5 mmol/L (ref 3.5–5.1)
Sodium: 132 mmol/L — ABNORMAL LOW (ref 135–145)

## 2023-04-16 LAB — CBG MONITORING, ED
Glucose-Capillary: 478 mg/dL — ABNORMAL HIGH (ref 70–99)
Glucose-Capillary: 325 mg/dL — ABNORMAL HIGH (ref 70–99)

## 2023-04-16 LAB — LIPASE, BLOOD: Lipase: 28 U/L (ref 11–51)

## 2023-04-16 MED ORDER — PIPERACILLIN-TAZOBACTAM 3.375 G IVPB
3.3750 g | Freq: Three times a day (TID) | INTRAVENOUS | Status: DC
  Administered 2023-04-17 – 2023-04-19 (×7): 3.375 g via INTRAVENOUS
  Filled 2023-04-16 (×8): qty 50

## 2023-04-16 MED ORDER — LEVOTHYROXINE SODIUM 88 MCG PO TABS
88.0000 ug | ORAL_TABLET | Freq: Every day | ORAL | Status: DC
  Administered 2023-04-17 – 2023-04-19 (×3): 88 ug via ORAL
  Filled 2023-04-16 (×3): qty 1

## 2023-04-16 MED ORDER — GLIPIZIDE 5 MG PO TABS
5.0000 mg | ORAL_TABLET | Freq: Two times a day (BID) | ORAL | Status: DC
  Administered 2023-04-17 – 2023-04-19 (×5): 5 mg via ORAL
  Filled 2023-04-16 (×5): qty 1

## 2023-04-16 MED ORDER — LACTATED RINGERS IV BOLUS
500.0000 mL | Freq: Once | INTRAVENOUS | Status: AC
  Administered 2023-04-16: 500 mL via INTRAVENOUS

## 2023-04-16 MED ORDER — LACTATED RINGERS IV BOLUS
1000.0000 mL | Freq: Once | INTRAVENOUS | Status: AC
  Administered 2023-04-16: 1000 mL via INTRAVENOUS

## 2023-04-16 MED ORDER — ASPIRIN 81 MG PO TBEC
81.0000 mg | DELAYED_RELEASE_TABLET | Freq: Once | ORAL | Status: AC
  Administered 2023-04-16: 81 mg via ORAL
  Filled 2023-04-16: qty 1

## 2023-04-16 MED ORDER — ACETAMINOPHEN 325 MG PO TABS: 650.0000 mg | ORAL_TABLET | Freq: Four times a day (QID) | ORAL | Status: DC | PRN

## 2023-04-16 MED ORDER — FINASTERIDE 5 MG PO TABS
5.0000 mg | ORAL_TABLET | Freq: Every day | ORAL | Status: DC
  Administered 2023-04-16 – 2023-04-19 (×4): 5 mg via ORAL
  Filled 2023-04-16 (×4): qty 1

## 2023-04-16 MED ORDER — ONDANSETRON HCL 4 MG PO TABS: 4.0000 mg | ORAL_TABLET | Freq: Four times a day (QID) | ORAL | Status: DC | PRN

## 2023-04-16 MED ORDER — PIPERACILLIN-TAZOBACTAM 3.375 G IVPB 30 MIN
3.3750 g | Freq: Once | INTRAVENOUS | Status: AC
  Administered 2023-04-16: 3.375 g via INTRAVENOUS
  Filled 2023-04-16: qty 50

## 2023-04-16 MED ORDER — INSULIN ASPART 100 UNIT/ML IJ SOLN
0.0000 [IU] | Freq: Three times a day (TID) | INTRAMUSCULAR | Status: DC
  Administered 2023-04-17: 1 [IU] via SUBCUTANEOUS
  Administered 2023-04-17 – 2023-04-18 (×2): 2 [IU] via SUBCUTANEOUS
  Administered 2023-04-18: 3 [IU] via SUBCUTANEOUS

## 2023-04-16 MED ORDER — ACETAMINOPHEN 650 MG RE SUPP: 650.0000 mg | Freq: Four times a day (QID) | RECTAL | Status: DC | PRN

## 2023-04-16 MED ORDER — ONDANSETRON HCL 4 MG/2ML IJ SOLN: 4.0000 mg | Freq: Four times a day (QID) | INTRAMUSCULAR | Status: DC | PRN

## 2023-04-16 MED ORDER — ENOXAPARIN SODIUM 30 MG/0.3ML IJ SOSY
30.0000 mg | PREFILLED_SYRINGE | INTRAMUSCULAR | Status: DC
  Administered 2023-04-16 – 2023-04-18 (×3): 30 mg via SUBCUTANEOUS
  Filled 2023-04-16 (×3): qty 0.3

## 2023-04-16 MED ORDER — PANTOPRAZOLE SODIUM 40 MG IV SOLR
40.0000 mg | INTRAVENOUS | Status: DC
  Administered 2023-04-16 – 2023-04-18 (×3): 40 mg via INTRAVENOUS
  Filled 2023-04-16 (×3): qty 10

## 2023-04-16 MED ORDER — TAMSULOSIN HCL 0.4 MG PO CAPS
0.4000 mg | ORAL_CAPSULE | Freq: Every day | ORAL | Status: DC
  Administered 2023-04-17 – 2023-04-19 (×3): 0.4 mg via ORAL
  Filled 2023-04-16 (×3): qty 1

## 2023-04-16 MED ORDER — PIPERACILLIN-TAZOBACTAM 3.375 G IVPB 30 MIN: 3.3750 g | Freq: Three times a day (TID) | INTRAVENOUS | Status: DC

## 2023-04-16 MED ORDER — SODIUM CHLORIDE 0.9 % IV SOLN
INTRAVENOUS | Status: DC
  Administered 2023-04-18 – 2023-04-19 (×2): 100 mL/h via INTRAVENOUS

## 2023-04-16 NOTE — ED Triage Notes (Signed)
Pt states he has not been able to sleep in a few days and unsure why. Also reports his last BM is Monday. Hx of diabetes and normally well controlled. Denies pain. Family states he was tender when UC doc pressed on lower abd.

## 2023-04-16 NOTE — ED Notes (Signed)
ED TO INPATIENT HANDOFF REPORT  Name/Age/Gender Jorge Butler 89 y.o. male  Code Status   Home/SNF/Other Home  Chief Complaint AKI (acute kidney injury) (HCC) [N17.9]  Level of Care/Admitting Diagnosis ED Disposition     ED Disposition  Admit   Condition  --   Comment  Hospital Area: [facility] [[id number]]  Level of Care: Progressive [102]  Admit to Progressive based on following criteria: NEPHROLOGY stable condition requiring close monitoring for AKI, requiring Hemodialysis or Peritoneal Dialysis either from expected electrolyte imbalance, acidosis, or fluid overload that can be managed by NIPPV or high flow oxygen.  Admit to Progressive based on following criteria: GI, ENDOCRINE disease patients with GI bleeding, acute liver failure or pancreatitis, stable with diabetic ketoacidosis or thyrotoxicosis (hypothyroid) state.  May admit patient to [facility] or [facility] if equivalent level of care is available:: No  Covid Evaluation: Asymptomatic - no recent exposure (last 10 days) testing not required  Diagnosis: AKI (acute kidney injury) [facility]) [[id number]]  Admitting Physician: Bobette Mo [[id number]]  Attending Physician: Bobette Mo [[id number]]  Certification:: I certify this patient will need inpatient services for at least 2 midnights  Estimated Length of Stay: 2          Medical History Past Medical History:  Diagnosis Date   Diabetes mellitus without complication (HCC)    Enlarged prostate    High cholesterol    Thyroid disease     Allergies Allergies  Allergen Reactions   Sulfa Antibiotics Nausea And Vomiting   Sulfamethoxazole-Trimethoprim Nausea And Vomiting    IV Location/Drains/Wounds Patient Lines/Drains/Airways Status     Active Line/Drains/Airways     Name Placement date Placement time Site Days   Peripheral IV 04/16/23 20 G 1" Left;Posterior Forearm 04/16/23  1328  Forearm  less than 1             Labs/Imaging Results for orders placed or performed during the hospital encounter of 04/16/23 (from the past 48 hour(s))  CBG monitoring, ED     Status: Abnormal   Collection Time: 04/16/23 12:29 PM  Result Value Ref Range   Glucose-Capillary 478 (H) 70 - 99 mg/dL    Comment: Glucose reference range applies only to samples taken after fasting for at least 8 hours.   Comment 1 Notify RN    Comment 2 Document in Chart   Comprehensive metabolic panel     Status: Abnormal   Collection Time: 04/16/23  1:15 PM  Result Value Ref Range   Sodium 126 (L) 135 - 145 mmol/L   Potassium 5.3 (H) 3.5 - 5.1 mmol/L    Comment: HEMOLYSIS AT THIS LEVEL MAY AFFECT RESULT   Chloride 94 (L) 98 - 111 mmol/L   CO2 19 (L) 22 - 32 mmol/L   Glucose, Bld 438 (H) 70 - 99 mg/dL    Comment: Glucose reference range applies only to samples taken after fasting for at least 8 hours.   BUN 45 (H) 8 - 23 mg/dL   Creatinine, Ser 2.35 (H) 0.61 - 1.24 mg/dL   Calcium 8.7 (L) 8.9 - 10.3 mg/dL   Total Protein 6.9 6.5 - 8.1 g/dL   Albumin 3.4 (L) 3.5 - 5.0 g/dL   AST 29 15 - 41 U/L    Comment: HEMOLYSIS AT THIS LEVEL MAY AFFECT RESULT   ALT 19 0 - 44 U/L    Comment: HEMOLYSIS AT THIS LEVEL MAY AFFECT RESULT   Alkaline Phosphatase 66 38 - 126  U/L   Total Bilirubin 1.6 (H) 0.3 - 1.2 mg/dL    Comment: HEMOLYSIS AT THIS LEVEL MAY AFFECT RESULT   GFR, Estimated 26 (L) >60 mL/min    Comment: (NOTE) Calculated using the CKD-EPI Creatinine Equation (2021)    Anion gap 13 5 - 15    Comment: Performed at [facility], [address], [place], [place] [address]  Lipase, blood     Status: None   Collection Time: 04/16/23  1:15 PM  Result Value Ref Range   Lipase 28 11 - 51 U/L    Comment: Performed at [facility], [address] [place], [place] [address]  Blood gas, venous (at [facility] and [facility])     Status: Abnormal   Collection Time: 04/16/23  1:15 PM  Result Value Ref Range   pH, Ven  7.37 7.25 - 7.43   pCO2, Ven 38 (L) 44 - 60 mmHg   pO2, Ven 68 (H) 32 - 45 mmHg   Bicarbonate 22.0 20.0 - 28.0 mmol/L   Acid-base deficit 2.9 (H) 0.0 - 2.0 mmol/L   O2 Saturation 93.3 %   Patient temperature 37.0    Drawn by [id number]     Comment: Performed at [facility], [address] [place], [place] [address]  I-Stat Lactic Acid     Status: Abnormal   Collection Time: 04/16/23  1:25 PM  Result Value Ref Range   Lactic Acid, Venous 2.5 (HH) 0.5 - 1.9 mmol/L   Comment NOTIFIED PHYSICIAN   I-stat chem 8, ED     Status: Abnormal   Collection Time: 04/16/23  1:43 PM  Result Value Ref Range   Sodium 128 (L) 135 - 145 mmol/L   Potassium 4.8 3.5 - 5.1 mmol/L   Chloride 96 (L) 98 - 111 mmol/L   BUN 48 (H) 8 - 23 mg/dL   Creatinine, Ser 9.52 (H) 0.61 - 1.24 mg/dL   Glucose, Bld 841 (H) 70 - 99 mg/dL    Comment: Glucose reference range applies only to samples taken after fasting for at least 8 hours.   Calcium, Ion 1.13 (L) 1.15 - 1.40 mmol/L   TCO2 23 22 - 32 mmol/L   Hemoglobin 15.0 13.0 - 17.0 g/dL   HCT 32.4 40.1 - 02.7 %  Urinalysis, Routine w reflex microscopic -Urine, Clean Catch     Status: Abnormal   Collection Time: 04/16/23  1:57 PM  Result Value Ref Range   Color, Urine YELLOW YELLOW   APPearance CLEAR CLEAR   Specific Gravity, Urine 1.018 1.005 - 1.030   pH 6.0 5.0 - 8.0   Glucose, UA >=500 (A) NEGATIVE mg/dL   Hgb urine dipstick NEGATIVE NEGATIVE   Bilirubin Urine NEGATIVE NEGATIVE   Ketones, ur 5 (A) NEGATIVE mg/dL   Protein, ur 30 (A) NEGATIVE mg/dL   Nitrite NEGATIVE NEGATIVE   Leukocytes,Ua NEGATIVE NEGATIVE   RBC / HPF 0-5 0 - 5 RBC/hpf   WBC, UA 0-5 0 - 5 WBC/hpf   Bacteria, UA NONE SEEN NONE SEEN   Squamous Epithelial / HPF 0-5 0 - 5 /HPF    Comment: Performed at [facility], [address], [place], [place] [address]  CBC with Differential/Platelet     Status: Abnormal   Collection Time: 04/16/23  2:13 PM  Result  Value Ref Range   WBC 12.4 (H) 4.0 - 10.5 K/uL   RBC 4.38 4.22 - 5.81 MIL/uL   Hemoglobin 13.2 13.0 -  17.0 g/dL   HCT 19.1 47.8 - 29.5 %   MCV 92.7 80.0 - 100.0 fL   MCH 30.1 26.0 - 34.0 pg   MCHC 32.5 30.0 - 36.0 g/dL   RDW 62.1 30.8 - 65.7 %   Platelets 211 150 - 400 K/uL   nRBC 0.0 0.0 - 0.2 %   Neutrophils Relative % 84 %   Neutro Abs 10.4 (H) 1.7 - 7.7 K/uL   Lymphocytes Relative 5 %   Lymphs Abs 0.7 0.7 - 4.0 K/uL   Monocytes Relative 10 %   Monocytes Absolute 1.2 (H) 0.1 - 1.0 K/uL   Eosinophils Relative 0 %   Eosinophils Absolute 0.0 0.0 - 0.5 K/uL   Basophils Relative 0 %   Basophils Absolute 0.0 0.0 - 0.1 K/uL   Immature Granulocytes 1 %   Abs Immature Granulocytes 0.06 0.00 - 0.07 K/uL    Comment: Performed at [facility], [address] [place], [place] [address]  I-Stat Lactic Acid     Status: None   Collection Time: 04/16/23  3:38 PM  Result Value Ref Range   Lactic Acid, Venous 1.9 0.5 - 1.9 mmol/L  POC CBG, ED     Status: Abnormal   Collection Time: 04/16/23  4:14 PM  Result Value Ref Range   Glucose-Capillary 325 (H) 70 - 99 mg/dL    Comment: Glucose reference range applies only to samples taken after fasting for at least 8 hours.   CT ABDOMEN PELVIS WO CONTRAST  Result Date: 04/16/2023 CLINICAL DATA:  Abdominal distention.  Constipation EXAM: CT ABDOMEN AND PELVIS WITHOUT CONTRAST TECHNIQUE: Multidetector CT imaging of the abdomen and pelvis was performed following the standard protocol without IV contrast. RADIATION DOSE REDUCTION: This exam was performed according to the departmental dose-optimization program which includes automated exposure control, adjustment of the mA and/or kV according to patient size and/or use of iterative reconstruction technique. COMPARISON:  None Available. FINDINGS: Lower chest: There is some linear opacity lung bases likely scar or atelectasis. Breathing motion. Trace right-sided pleural fluid. Coronary artery  calcifications are seen. Hepatobiliary: On this non IV contrast exam, grossly the liver is preserved. Gallbladder is nondilated. Pancreas: Global atrophy of the pancreas.  No obvious mass. Spleen: Normal in size without focal abnormality. Adrenals/Urinary Tract: Adrenal glands are preserved. Mild bilateral renal atrophy. No abnormal calcification seen within either kidney nor along the course of either ureter. Preserved contours of the urinary bladder. Bladder wall slightly thickened and trabeculated with some small bladder diverticula. Stomach/Bowel: No oral contrast. Large bowel has scattered colonic stool with diverticula. Large bowel is nondilated. Stomach is distended with fluid and air. Small bowel has several loops which are dilated with air-fluid levels measuring up to 3.3 cm. Several diverticula seen. There is some fluid and stranding in the right hemi abdominal mesentery. There is also some areas of pneumatosis along the small bowel and scattered areas such as left hemiabdomen anteriorly on series 4, image 34 and right hemiabdomen on series 2, image 39. Possibilities would include areas of bowel ischemia. Please correlate clinical presentation. No free air. No clear portal venous gas at this time. Vascular/Lymphatic: Scattered vascular calcifications along the aorta and branch vessels. Normal caliber aorta and IVC. Reproductive: Enlarged prostate. Please correlate with the patient's history and PSA. Mass effect along the bladder. Other: Small fat containing right inguinal hernia.  Mild ascites. Musculoskeletal: Mild degenerative changes along the spine. Critical Value/emergent results were called by telephone at the time of  interpretation on 04/16/2023 at 1:39 pm to provider [facility] DAVIS , who verbally acknowledged these results. IMPRESSION: Diffusely dilated small bowel without transition point. There is some areas of pneumatosis, mesenteric stranding and trace ascites. Please correlate for etiology  including evidence of bowel ischemia. A postcontrast study may be of some benefit due to further delineate as clinically appropriate. Scattered small and large bowel diverticula. Tiny pleural effusion. Motion Enlarged prostate with mass effect along the bladder. Bladder wall is thickened and trabeculated with some small diverticula Electronically Signed   By: Karen Kays M.D.   On: 04/16/2023 16:40   DG Chest 2 View  Result Date: 04/16/2023 CLINICAL DATA:  Hyperglycemia. EXAM: CHEST - 2 VIEW COMPARISON:  None Available. FINDINGS: No consolidation, pneumothorax or effusion. Normal cardiopericardial silhouette without edema. Degenerative changes of the spine. Overlapping cardiac leads. Air-fluid level along the stomach beneath the left hemidiaphragm. IMPRESSION: Hyperinflation.  No acute cardiopulmonary disease. Electronically Signed   By: Karen Kays M.D.   On: 04/16/2023 13:52   DG Abd 1 View  Result Date: 04/16/2023 CLINICAL DATA:  Acute generalized abdominal pain. EXAM: ABDOMEN - 1 VIEW COMPARISON:  None Available. FINDINGS: Dilated small bowel loops are noted with air-fluid levels concerning for distal small bowel obstruction. Air-filled colon is noted as well. No definite free air is noted. IMPRESSION: Small bowel dilatation with air-fluid levels is noted concerning for distal small bowel obstruction. CT scan is recommended for further evaluation. Electronically Signed   By: Lupita Raider M.D.   On: 04/16/2023 12:37    Pending Labs Unresulted Labs (From admission, onward)     Start     Ordered   04/16/23 1658  Blood culture (routine x 2)  BLOOD CULTURE X 2,   R (with STAT occurrences)      04/16/23 1657   04/16/23 1304  CBC with Differential  Once,   STAT        04/16/23 1304            Vitals/Pain Today's Vitals   04/16/23 1300 04/16/23 1400 04/16/23 1500 04/16/23 1636  BP: 122/62 134/65 (!) 149/64   Pulse: (!) 102 94 98   Resp: 20 18 (!) 22   Temp:    98.3 F (36.8 C)   TempSrc:    Oral  SpO2: 94% 97% 96%   Weight:      Height:      PainSc:        Isolation Precautions No active isolations  Medications Medications  lactated ringers bolus 500 mL (has no administration in time range)  piperacillin-tazobactam (ZOSYN) IVPB 3.375 g (has no administration in time range)  lactated ringers bolus 1,000 mL (1,000 mLs Intravenous New Bag/Given 04/16/23 1328)    Mobility walks   A&Ox4, VSS, NAD, RA

## 2023-04-16 NOTE — Discharge Instructions (Addendum)
Please go directly to the ER for further evaluation of the high blood sugar

## 2023-04-16 NOTE — ED Triage Notes (Signed)
Pt reports starting Monday afternoon had runny nose and having to urinate more at night the past two nights. Also reports hasn't had BM since Tuesday morning. Reports "stomach doesn't hurt but doesn't feel quite right".

## 2023-04-16 NOTE — ED Notes (Signed)
Patient is being discharged from the Urgent Care and sent to the Emergency Department via POV with family. Per Cathlean Marseilles, NP, patient is in need of higher level of care due to hyperglycemia. Patient is aware and verbalizes understanding of plan of care.  Vitals:   04/16/23 1105  BP: 105/68  Pulse: (!) 113  Resp: 20  Temp: 97.9 F (36.6 C)  SpO2: 92%

## 2023-04-16 NOTE — ED Provider Notes (Signed)
[facility]    CSN: [id number] Arrival date & time: 04/16/23  1038      History   Chief Complaint Chief Complaint  Patient presents with   Nasal Congestion   Nocturia    HPI ZAEEM KANDEL is a 89 y.o. male.   Patient presents today witth Vassie Loll for 2-day history of dry cough, runny nose, stuffy nose, slightly hoarse throat, and fatigue.  Reports he has not slept well in the past 2 nights.  Reports he went grocery shopping 2 days ago and thinks he may have picked up a bug from the grocery store.  No fever, body aches or chills, chest congestion, shortness of breath or chest pain, headache, ear pain, nausea/vomiting, diarrhea, lack of appetite, loss of taste or smell.  Reports his abdomen feels "a little off", has also been having increased urination and excessive thirst.  Reports history of type 2 diabetes, takes pioglitazone.  Checks blood sugar weekly-is typically in the 140s.  Last A1c last week 7.9%.    Past Medical History:  Diagnosis Date   Diabetes mellitus without complication (HCC)    Enlarged prostate    High cholesterol    Thyroid disease     There are no problems to display for this patient.   Past Surgical History:  Procedure Laterality Date   pituitory          Home Medications    Prior to Admission medications   Medication Sig Start Date End Date Taking? Authorizing Provider  aspirin EC 81 MG tablet Take 81 mg by mouth once. 08/26/13  Yes [provider]  atorvastatin (LIPITOR) 20 MG tablet Take 20 mg by mouth daily. 12/04/22  Yes [provider]  finasteride (PROSCAR) 5 MG tablet Take 5 mg by mouth daily. 08/02/09  Yes [provider]  glipiZIDE (GLUCOTROL) 5 MG tablet Take 5 mg by mouth 2 (two) times daily before a meal. 12/04/22  Yes [provider]  levothyroxine (SYNTHROID) 88 MCG tablet Take 88 mcg by mouth daily before breakfast. 12/04/22  Yes [provider]  pioglitazone  (ACTOS) 45 MG tablet Take 45 mg by mouth daily. 06/04/22  Yes [provider]  tamsulosin (FLOMAX) 0.4 MG CAPS capsule Take 0.4 mg by mouth daily. 03/22/20  Yes [provider]  neomycin-bacitracin-polymyxin (NEOSPORIN) ointment Apply 1 application. topically every 12 (twelve) hours. 01/04/22   Lamptey, Britta Mccreedy, MD    Family History No family history on file.  Social History Social History   Tobacco Use   Smoking status: Never   Smokeless tobacco: Never     Allergies   Sulfa antibiotics and Sulfamethoxazole-trimethoprim   Review of Systems Review of Systems Per HPI  Physical Exam Triage Vital Signs ED Triage Vitals  Encounter Vitals Group     BP 04/16/23 1105 105/68     Systolic BP Percentile --      Diastolic BP Percentile --      Pulse Rate 04/16/23 1105 (!) 113     Resp 04/16/23 1105 20     Temp 04/16/23 1105 97.9 F (36.6 C)     Temp Source 04/16/23 1105 Oral     SpO2 04/16/23 1105 92 %     Weight --      Height --      Head Circumference --      Peak Flow --      Pain Score 04/16/23 1100 0     Pain Loc --  Pain Education --      Exclude from Growth Chart --    No data found.  Updated Vital Signs BP 105/68 (BP Location: Right Arm)   Pulse (!) 113   Temp 97.9 F (36.6 C) (Oral)   Resp 20   SpO2 92%   Visual Acuity Right Eye Distance:   Left Eye Distance:   Bilateral Distance:    Right Eye Near:   Left Eye Near:    Bilateral Near:     Physical Exam Vitals and nursing note reviewed.  Constitutional:      General: He is not in acute distress.    Appearance: Normal appearance. He is not toxic-appearing.  HENT:     Head: Normocephalic and atraumatic.     Right Ear: External ear normal.     Left Ear: External ear normal.     Nose: Nose normal. No congestion or rhinorrhea.     Mouth/Throat:     Mouth: Mucous membranes are moist.     Pharynx: Oropharynx is clear.  Cardiovascular:     Rate and Rhythm: Normal rate and  regular rhythm.  Pulmonary:     Effort: Pulmonary effort is normal. No respiratory distress.     Breath sounds: No wheezing, rhonchi or rales.  Abdominal:     General: Abdomen is flat. Bowel sounds are decreased.     Palpations: Abdomen is soft.     Tenderness: There is generalized abdominal tenderness.  Musculoskeletal:     Cervical back: Normal range of motion.  Lymphadenopathy:     Cervical: No cervical adenopathy.  Skin:    General: Skin is warm and dry.     Capillary Refill: Capillary refill takes less than 2 seconds.     Coloration: Skin is pale. Skin is not jaundiced.  Neurological:     Mental Status: He is alert and oriented to person, place, and time.  Psychiatric:        Behavior: Behavior is cooperative.      UC Treatments / Results  Labs (all labs ordered are listed, but only abnormal results are displayed) Labs Reviewed  POCT URINALYSIS DIP (MANUAL ENTRY) - Abnormal; Notable for the following components:      Result Value   Glucose, UA >=1,000 (*)    Ketones, POC UA trace (5) (*)    Protein Ur, POC =100 (*)    All other components within normal limits  POCT FASTING CBG [facility] MANUAL ENTRY - Abnormal; Notable for the following components:   POCT Glucose ([facility]) 454 (*)    All other components within normal limits    EKG   Radiology No results found.  Procedures Procedures (including critical care time)  Medications Ordered in UC Medications - No data to display  Initial Impression / Assessment and Plan / UC Course  I have reviewed the triage vital signs and the nursing notes.  Pertinent labs & imaging results that were available during my care of the patient were reviewed by me and considered in my medical decision making (see chart for details).   Patient is well-appearing, normotensive, afebrile, not tachypneic, oxygenating well on room air.  Patient is mildly tachycardic in triage today.    1. Hyperglycemia 2. Nocturia 3. Insomnia, unspecified  type 4. Excessive thirst Urinalysis today positive glucose, ketones, protein Patient reports he ate Wheaties, [place] yogurt, fruit for breakfast, so therefore should not have ketones Blood sugar greater than 400 I am concerned for HHS Recommended further evaluation and management in  emergency room-patient and RN Agreeable Patient's godson tells me he take him straight to the ER  The patient was given the opportunity to ask questions.  All questions answered to their satisfaction.  The patient is in agreement to this plan.    Final Clinical Impressions(s) / UC Diagnoses   Final diagnoses:  Hyperglycemia  Nocturia  Insomnia, unspecified type  Excessive thirst     Discharge Instructions      Please go directly to the ER for further evaluation of the high blood sugar     ED Prescriptions   None    PDMP not reviewed this encounter.   Valentino Nose, NP 04/16/23 [phone]

## 2023-04-16 NOTE — ED Provider Notes (Signed)
[place] [facility] AT [facility] Provider Note   CSN: [id number] Arrival date & time: 04/16/23  1225     History  Chief Complaint  Patient presents with   Hyperglycemia    TYROME DONATELLI is a 89 y.o. male.   Hyperglycemia 89 year old male history of hyperlipidemia, type 2 diabetes not on insulin presenting for needed.  Patient is here with his son.  Patient states he has not been asleep for the last few days for unclear reason so went to urgent care.  He was noted to be severely hyperglycemic at that time.  He was sent here for evaluation.  He reports he feels well.  Denies headache, neck pain, chest pain, abdominal pain, back pain.  Does note abdomen may be a little bit distended.  He has been taking all his medications without recent change.  He has no fevers or chills or cough.  No shortness of breath.  Normal bowel movements.  No vomiting.     Home Medications Prior to Admission medications   Medication Sig Start Date End Date Taking? Authorizing Provider  aspirin EC 81 MG tablet Take 81 mg by mouth once. 08/26/13   [provider]  atorvastatin (LIPITOR) 20 MG tablet Take 20 mg by mouth daily. 12/04/22   [provider]  finasteride (PROSCAR) 5 MG tablet Take 5 mg by mouth daily. 08/02/09   [provider]  glipiZIDE (GLUCOTROL) 5 MG tablet Take 5 mg by mouth 2 (two) times daily before a meal. 12/04/22   [provider]  levothyroxine (SYNTHROID) 88 MCG tablet Take 88 mcg by mouth daily before breakfast. 12/04/22   [provider]  neomycin-bacitracin-polymyxin (NEOSPORIN) ointment Apply 1 application. topically every 12 (twelve) hours. 01/04/22   Lamptey, Britta Mccreedy, MD  pioglitazone (ACTOS) 45 MG tablet Take 45 mg by mouth daily. 06/04/22   [provider]  tamsulosin (FLOMAX) 0.4 MG CAPS capsule Take 0.4 mg by mouth daily. 03/22/20   [provider]      Allergies    Sulfa antibiotics and  Sulfamethoxazole-trimethoprim    Review of Systems   Review of Systems Review of systems completed and notable as per HPI.  ROS otherwise negative.   Physical Exam Updated Vital Signs BP (!) 149/64   Pulse 98   Temp 98.3 F (36.8 C) (Oral)   Resp (!) 22   Ht 5\' 9"  (1.753 m)   Wt 77 kg   SpO2 96%   BMI 25.07 kg/m  Physical Exam Vitals and nursing note reviewed.  Constitutional:      General: He is not in acute distress.    Appearance: He is well-developed.  HENT:     Head: Normocephalic and atraumatic.     Nose: Nose normal.     Mouth/Throat:     Mouth: Mucous membranes are moist.     Pharynx: Oropharynx is clear. No oropharyngeal exudate or posterior oropharyngeal erythema.  Eyes:     Extraocular Movements: Extraocular movements intact.     Conjunctiva/sclera: Conjunctivae normal.     Pupils: Pupils are equal, round, and reactive to light.  Cardiovascular:     Rate and Rhythm: Regular rhythm. Tachycardia present.     Heart sounds: No murmur heard. Pulmonary:     Effort: Pulmonary effort is normal. No respiratory distress.     Breath sounds: Normal breath sounds.  Abdominal:     General: There is distension.     Palpations: Abdomen is soft.  Tenderness: There is abdominal tenderness. There is no guarding or rebound.     Comments: Mild lower tenderness.  Musculoskeletal:        General: No swelling.     Cervical back: Neck supple.     Right lower leg: No edema.     Left lower leg: No edema.  Skin:    General: Skin is warm and dry.     Capillary Refill: Capillary refill takes less than 2 seconds.  Neurological:     General: No focal deficit present.     Mental Status: He is alert and oriented to person, place, and time. Mental status is at baseline.     Cranial Nerves: No cranial nerve deficit.     Sensory: No sensory deficit.     Motor: No weakness.  Psychiatric:        Mood and Affect: Mood normal.     ED Results / Procedures / Treatments    Labs (all labs ordered are listed, but only abnormal results are displayed) Labs Reviewed  COMPREHENSIVE METABOLIC PANEL - Abnormal; Notable for the following components:      Result Value   Sodium 126 (*)    Potassium 5.3 (*)    Chloride 94 (*)    CO2 19 (*)    Glucose, Bld 438 (*)    BUN 45 (*)    Creatinine, Ser 2.30 (*)    Calcium 8.7 (*)    Albumin 3.4 (*)    Total Bilirubin 1.6 (*)    GFR, Estimated 26 (*)    All other components within normal limits  URINALYSIS, ROUTINE W REFLEX MICROSCOPIC - Abnormal; Notable for the following components:   Glucose, UA >=500 (*)    Ketones, ur 5 (*)    Protein, ur 30 (*)    All other components within normal limits  BLOOD GAS, VENOUS - Abnormal; Notable for the following components:   pCO2, Ven 38 (*)    pO2, Ven 68 (*)    Acid-base deficit 2.9 (*)    All other components within normal limits  CBC WITH DIFFERENTIAL/PLATELET - Abnormal; Notable for the following components:   WBC 12.4 (*)    Neutro Abs 10.4 (*)    Monocytes Absolute 1.2 (*)    All other components within normal limits  CBG MONITORING, ED - Abnormal; Notable for the following components:   Glucose-Capillary 478 (*)    All other components within normal limits  I-STAT CG4 LACTIC ACID, ED - Abnormal; Notable for the following components:   Lactic Acid, Venous 2.5 (*)    All other components within normal limits  I-STAT CHEM 8, ED - Abnormal; Notable for the following components:   Sodium 128 (*)    Chloride 96 (*)    BUN 48 (*)    Creatinine, Ser 2.30 (*)    Glucose, Bld 435 (*)    Calcium, Ion 1.13 (*)    All other components within normal limits  CBG MONITORING, ED - Abnormal; Notable for the following components:   Glucose-Capillary 325 (*)    All other components within normal limits  CULTURE, BLOOD (ROUTINE X 2)  CULTURE, BLOOD (ROUTINE X 2)  LIPASE, BLOOD  CBC WITH DIFFERENTIAL/PLATELET  CBC  COMPREHENSIVE METABOLIC PANEL  HEMOGLOBIN A1C  I-STAT CG4  LACTIC ACID, ED    EKG EKG Interpretation Date/Time:  Wednesday April 16 2023 12:33:21 EDT Ventricular Rate:  106 PR Interval:  192 QRS Duration:  84 QT Interval:  298 QTC  Calculation: 396 R Axis:   109  Text Interpretation: Sinus tachycardia Right axis deviation Low voltage, precordial leads Confirmed by Fulton Reek [phone]) on 04/16/2023 1:09:37 PM  Radiology CT ABDOMEN PELVIS WO CONTRAST  Result Date: 04/16/2023 CLINICAL DATA:  Abdominal distention.  Constipation EXAM: CT ABDOMEN AND PELVIS WITHOUT CONTRAST TECHNIQUE: Multidetector CT imaging of the abdomen and pelvis was performed following the standard protocol without IV contrast. RADIATION DOSE REDUCTION: This exam was performed according to the departmental dose-optimization program which includes automated exposure control, adjustment of the mA and/or kV according to patient size and/or use of iterative reconstruction technique. COMPARISON:  None Available. FINDINGS: Lower chest: There is some linear opacity lung bases likely scar or atelectasis. Breathing motion. Trace right-sided pleural fluid. Coronary artery calcifications are seen. Hepatobiliary: On this non IV contrast exam, grossly the liver is preserved. Gallbladder is nondilated. Pancreas: Global atrophy of the pancreas.  No obvious mass. Spleen: Normal in size without focal abnormality. Adrenals/Urinary Tract: Adrenal glands are preserved. Mild bilateral renal atrophy. No abnormal calcification seen within either kidney nor along the course of either ureter. Preserved contours of the urinary bladder. Bladder wall slightly thickened and trabeculated with some small bladder diverticula. Stomach/Bowel: No oral contrast. Large bowel has scattered colonic stool with diverticula. Large bowel is nondilated. Stomach is distended with fluid and air. Small bowel has several loops which are dilated with air-fluid levels measuring up to 3.3 cm. Several diverticula seen. There is some fluid  and stranding in the right hemi abdominal mesentery. There is also some areas of pneumatosis along the small bowel and scattered areas such as left hemiabdomen anteriorly on series 4, image 34 and right hemiabdomen on series 2, image 39. Possibilities would include areas of bowel ischemia. Please correlate clinical presentation. No free air. No clear portal venous gas at this time. Vascular/Lymphatic: Scattered vascular calcifications along the aorta and branch vessels. Normal caliber aorta and IVC. Reproductive: Enlarged prostate. Please correlate with the patient's history and PSA. Mass effect along the bladder. Other: Small fat containing right inguinal hernia.  Mild ascites. Musculoskeletal: Mild degenerative changes along the spine. Critical Value/emergent results were called by telephone at the time of interpretation on 04/16/2023 at 1:39 pm to provider [facility]  , who verbally acknowledged these results. IMPRESSION: Diffusely dilated small bowel without transition point. There is some areas of pneumatosis, mesenteric stranding and trace ascites. Please correlate for etiology including evidence of bowel ischemia. A postcontrast study may be of some benefit due to further delineate as clinically appropriate. Scattered small and large bowel diverticula. Tiny pleural effusion. Motion Enlarged prostate with mass effect along the bladder. Bladder wall is thickened and trabeculated with some small diverticula Electronically Signed   By: Karen Kays M.D.   On: 04/16/2023 16:40   DG Chest 2 View  Result Date: 04/16/2023 CLINICAL DATA:  Hyperglycemia. EXAM: CHEST - 2 VIEW COMPARISON:  None Available. FINDINGS: No consolidation, pneumothorax or effusion. Normal cardiopericardial silhouette without edema. Degenerative changes of the spine. Overlapping cardiac leads. Air-fluid level along the stomach beneath the left hemidiaphragm. IMPRESSION: Hyperinflation.  No acute cardiopulmonary disease. Electronically  Signed   By: Karen Kays M.D.   On: 04/16/2023 13:52   DG Abd 1 View  Result Date: 04/16/2023 CLINICAL DATA:  Acute generalized abdominal pain. EXAM: ABDOMEN - 1 VIEW COMPARISON:  None Available. FINDINGS: Dilated small bowel loops are noted with air-fluid levels concerning for distal small bowel obstruction. Air-filled colon is noted as well. No definite free  air is noted. IMPRESSION: Small bowel dilatation with air-fluid levels is noted concerning for distal small bowel obstruction. CT scan is recommended for further evaluation. Electronically Signed   By: Lupita Raider M.D.   On: 04/16/2023 12:37    Procedures Procedures    Medications Ordered in ED Medications  lactated ringers bolus 500 mL (has no administration in time range)  piperacillin-tazobactam (ZOSYN) IVPB 3.375 g (has no administration in time range)  enoxaparin (LOVENOX) injection 30 mg (has no administration in time range)  0.9 %  sodium chloride infusion (has no administration in time range)  acetaminophen (TYLENOL) tablet 650 mg (has no administration in time range)    Or  acetaminophen (TYLENOL) suppository 650 mg (has no administration in time range)  ondansetron (ZOFRAN) tablet 4 mg (has no administration in time range)    Or  ondansetron (ZOFRAN) injection 4 mg (has no administration in time range)  pantoprazole (PROTONIX) injection 40 mg (has no administration in time range)  piperacillin-tazobactam (ZOSYN) IVPB 3.375 g (has no administration in time range)  tamsulosin (FLOMAX) capsule 0.4 mg (has no administration in time range)  levothyroxine (SYNTHROID) tablet 88 mcg (has no administration in time range)  glipiZIDE (GLUCOTROL) tablet 5 mg (has no administration in time range)  finasteride (PROSCAR) tablet 5 mg (has no administration in time range)  aspirin EC tablet 81 mg (has no administration in time range)  insulin aspart (novoLOG) injection 0-9 Units (has no administration in time range)  lactated  ringers bolus 1,000 mL (0 mLs Intravenous Stopped 04/16/23 1813)    ED Course/ Medical Decision Making/ A&P Clinical Course as of 04/16/23 1848  Wed Apr 16, 2023  [address] [facility] Radiology: C/f mesenteric ischemia [JD]  1657 [facility] Gen surg: abx, no CTA, resus, medicine admit [JD]    Clinical Course User Index [JD] Laurence Spates, MD                             Medical Decision Making Amount and/or Complexity of Data Reviewed Labs: ordered. Radiology: ordered.  Risk Prescription drug management. Decision regarding hospitalization.   Medical Decision Making:   WILIAN KWONG is a 89 y.o. male who presented to the ED today with a 89-year-old male with history of diabetes presenting for hyperglycemia.  On arrival he is intermittently tachypneic, mildly tachycardic.  He is endorsing some decreased sleep but otherwise is asymptomatic.  EKG is reassuring, no signs of acute ischemia and no chest pain low concern for ACS.  I am concerned for possible DKA, HHS, dehydration.  He also has some abdominal distention and tenderness on exam, obtain CT scan to evaluate for possible appendicitis, bowel obstruction, cholecystitis although no abdominal pain.  Patient placed on continuous vitals and telemetry monitoring while in ED which was reviewed periodically.  Reviewed and confirmed nursing documentation for past medical history, family history, social history.  Initial Study Results:   Laboratory  All laboratory results reviewed.  Labs notable for initial lactic acid mildly elevated.  Hyperglycemia.  CMP notable for hyponatremia, hyperglycemia, mild non-anion gap LOC acidosis, possible AKI with unclear baseline renal function.  Blood gas with normal pH.  No signs of urinary tract infection.  EKG EKG was reviewed independently.  Radiology:  All images reviewed independently.  Concern for possible mesenteric ischemia.  Agree with radiology report at this time.      Consults: Case discussed  with general surgery.   Reassessment and  Plan:   Patient's workup is concerning for AKI, hyperglycemia without evidence of DKA.  His CT scan is concerning for possible mesenteric ischemia.  On repeat exam he remained stable, lactic acid is downtrending.  I discussed the patient with Dr. Donell Beers with general surgery.  She will see the patient.  She does not think CT is indicated at this time especially with his renal dysfunction.  She recommends resuscitation, IV antibiotics, admission to medicine.  I discussed the patient with the medicine team and he was admitted for further management.  Patient's presentation is most consistent with acute presentation with potential threat to life or bodily function.           Final Clinical Impression(s) / ED Diagnoses Final diagnoses:  Mesenteric ischemia [facility])    Rx / DC Orders ED Discharge Orders     None         Laurence Spates, MD 04/16/23 [phone]

## 2023-04-16 NOTE — H&P (Signed)
History and Physical    Patient: Jorge Butler ZOX:[id number] DOB: 09-13-1932 DOA: 04/16/2023 DOS: the patient was seen and examined on 04/16/2023 PCP: Creola Corn, MD  Patient coming from: Home  Chief Complaint:  Chief Complaint  Patient presents with   Hyperglycemia   HPI: Jorge Butler is a 89 y.o. male with medical history significant of type 2 diabetes, BPH, hyperlipidemia, hypothyroidism who was sent from urgent care to the emergency department due to hyperglycemia.  He initially presented to the urgent care center with complaints of not sleeping well for the past 2 nights, and some GI discomfort which made the patient think that he may have "caught a bug" while he was doing his shopping at the grocery store a couple of days ago.  He was subsequently referred to the emergency department due to hyperglycemia and AKI. He denied fever, chills, rhinorrhea, sore throat, wheezing or hemoptysis.  No chest pain, palpitations, diaphoresis, PND, orthopnea or pitting edema of the lower extremities.  No abdominal pain, nausea, emesis, diarrhea, constipation, melena or hematochezia.  No flank pain, dysuria, frequency or hematuria.  No polyuria, polydipsia, polyphagia or blurred vision.   Lab work: Urinalysis showed glucose greater than 500, ketones of 5 and protein of 30 mg/dL.  Venous blood gas showed a normal pH, pCO2 of 38 and pO2 of 68 mmHg with a acid-base deficit of 2.9 mmol/L.  Bicarbonate was normal.  Lactic acid is 2.5 and 1.9 mmol/L.  CBC showed a white count of 12.4 with 84% neutrophils, hemoglobin 13.2 g/dL and platelets 811.  Lipase was 28.  CMP showed a sodium 126 (corrected 134), potassium 5.3, chloride 94 and CO2 19 mmol/L.  Total bilirubin 1.6, glucose 438, BUN 45 and creatinine 2.30 mg/dL.  Albumin was 3.4 g/dL.  Calcium and the rest of the LFTs were normal after correction.  Imaging: 2 view chest radiograph with hyperinflation, but no acute cardiopulmonary disease.  1 view  abdominal x-ray with small bowel dilatation with air-fluid levels concerning for small bowel obstruction.  CT scan recommended.  CT abdomen/pelvis without contrast showed diffusely dilated small bowel without transition point.  There are some areas of pneumatosis, mesenteric stranding and trace ascites.  Etiology could include bowel ischemia.  Postcontrast study may be of some benefit.  There are scattered small and large bowel diverticula.  Tiny pleural effusion.  Enlarged prostate with mass effect along the bladder.  Bladder wall is thickened and trabeculated with some small diverticuli.  Please see images and full radiology report for further details.  ED course: Initial vital signs were temperature 97.7 F, pulse 104, respirations 33, BP 129/59 mmHg O2 sat 96% on room air.  The patient received LR 1000 mL bolus and Zosyn in the emergency department.   Review of Systems: As mentioned in the history of present illness. All other systems reviewed and are negative. Past Medical History:  Diagnosis Date   Diabetes mellitus without complication (HCC)    Enlarged prostate    High cholesterol    Thyroid disease    Past Surgical History:  Procedure Laterality Date   pituitory      Social History:  reports that he has never smoked. He has never used smokeless tobacco. No history on file for alcohol use and drug use.  Allergies  Allergen Reactions   Sulfa Antibiotics Other (See Comments)   Sulfamethoxazole-Trimethoprim     History reviewed. No pertinent family history.  Prior to Admission medications   Medication Sig Start Date End  Date Taking? Authorizing Provider  aspirin EC 81 MG tablet Take 81 mg by mouth once. 08/26/13   [provider]  atorvastatin (LIPITOR) 20 MG tablet Take 20 mg by mouth daily. 12/04/22   [provider]  finasteride (PROSCAR) 5 MG tablet Take 5 mg by mouth daily. 08/02/09   [provider]  glipiZIDE (GLUCOTROL) 5 MG tablet Take 5 mg by  mouth 2 (two) times daily before a meal. 12/04/22   [provider]  levothyroxine (SYNTHROID) 88 MCG tablet Take 88 mcg by mouth daily before breakfast. 12/04/22   [provider]  neomycin-bacitracin-polymyxin (NEOSPORIN) ointment Apply 1 application. topically every 12 (twelve) hours. 01/04/22   Lamptey, Britta Mccreedy, MD  pioglitazone (ACTOS) 45 MG tablet Take 45 mg by mouth daily. 06/04/22   [provider]  tamsulosin (FLOMAX) 0.4 MG CAPS capsule Take 0.4 mg by mouth daily. 03/22/20   [provider]    Physical Exam: Vitals:   04/16/23 1300 04/16/23 1400 04/16/23 1500 04/16/23 1636  BP: 122/62 134/65 (!) 149/64   Pulse: (!) 102 94 98   Resp: 20 18 (!) 22   Temp:    98.3 F (36.8 C)  TempSrc:    Oral  SpO2: 94% 97% 96%   Weight:      Height:       Physical Exam Vitals and nursing note reviewed.  Constitutional:      General: He is awake. He is not in acute distress.    Appearance: He is ill-appearing.  HENT:     Head: Normocephalic.     Nose: No rhinorrhea.     Mouth/Throat:     Mouth: Mucous membranes are dry.  Eyes:     General: No scleral icterus.    Pupils: Pupils are equal, round, and reactive to light.  Neck:     Vascular: No JVD.  Cardiovascular:     Rate and Rhythm: Normal rate and regular rhythm.     Heart sounds: S1 normal and S2 normal.  Pulmonary:     Effort: Pulmonary effort is normal.     Breath sounds: Normal breath sounds.  Abdominal:     General: Abdomen is protuberant. Bowel sounds are decreased. There is distension.     Palpations: Abdomen is soft.     Tenderness: There is no abdominal tenderness. There is no right CVA tenderness, left CVA tenderness, guarding or rebound.  Musculoskeletal:     Cervical back: Neck supple.     Right lower leg: No edema.     Left lower leg: No edema.  Skin:    General: Skin is warm and dry.  Neurological:     General: No focal deficit present.     Mental Status: He is alert and  oriented to person, place, and time.  Psychiatric:        Mood and Affect: Mood normal.        Behavior: Behavior normal. Behavior is cooperative.   Data Reviewed:  Results are pending, will review when available.  Assessment and Plan: Principal Problem:   AKI (acute kidney injury) (HCC) Observation/telemetry. Continue IV fluids. Avoid hypotension. Avoid nephrotoxins. Monitor intake and output. Monitor renal function and electrolytes.  Active Problems:   Pneumatosis intestinalis  Keep n.p.o. for now. Continue IV hydration. Keep electrolytes optimized. Dr. Donell Beers consult appreciated. -Will follow her recommendations.    Type 2 diabetes mellitus with hyperglycemia (HCC) Improving with IV fluids. The patient is currently n.p.o. Will continue IV fluids and  monitor CBG. Hold glipizide 5 mg p.o. daily until back on diet. Hold Sitagliptin due to renal function. Resume Actos 15 mg p.o. daily once clear oral intake.    Hyperkalemia Continue IV fluids. Follow-up potassium level.    Hyperbilirubinemia In the setting of dehydration. Recheck once hydrated.    Pseudohyponatremia Continue IV fluids. Follow sodium and glucose level in AM.    Mild protein-calorie malnutrition (HCC) Protein supplementation once cleared for diet. Consider nutritional services evaluation.    Hypertension Parenteral antihypertensives as needed.    Hypothyroidism Continue levothyroxine 88 mcg p.o. daily.    Hyperlipidemia Will check total CK. Resume atorvastatin in AM.    Elevated PSA Continue tamsulosin 0.4 mg p.o. daily. Follow-up with urology as an outpatient.    Glaucoma Currently not on eyedrops. Follow-up with ophthalmology as an outpatient.     Advance Care Planning:   Code Status: Full Code   Consults: [facility] (Dr. Donell Beers).  Family Communication: His godson was at bedside.  Severity of Illness: The appropriate patient status for this patient is INPATIENT. Inpatient status  is judged to be reasonable and necessary in order to provide the required intensity of service to ensure the patient's safety. The patient's presenting symptoms, physical exam findings, and initial radiographic and laboratory data in the context of their chronic comorbidities is felt to place them at high risk for further clinical deterioration. Furthermore, it is not anticipated that the patient will be medically stable for discharge from the hospital within 2 midnights of admission.   * I certify that at the point of admission it is my clinical judgment that the patient will require inpatient hospital care spanning beyond 2 midnights from the point of admission due to high intensity of service, high risk for further deterioration and high frequency of surveillance required.*  Author: Bobette Mo, MD 04/16/2023 5:09 PM  For on call review [email or web address].   This document was prepared using Dragon voice recognition software and may contain some unintended transcription errors.

## 2023-04-16 NOTE — Progress Notes (Signed)
A consult was received from an ED physician for piperacillin/tazobactam per pharmacy dosing.  The patient's profile has been reviewed for ht/wt/allergies/indication/available labs.    Allergies  Allergen Reactions   Sulfa Antibiotics Other (See Comments)   Sulfamethoxazole-Trimethoprim    A one time order has been placed for piperacillin/tazobactam 3.375 g IV once to be infused over 30 minutes.    Further antibiotics/pharmacy consults should be ordered by admitting physician if indicated.                       Thank you, Cindi Carbon, PharmD 04/16/2023  5:02 PM

## 2023-04-16 NOTE — Consult Note (Signed)
Reason for Consult:pneumatosis Referring Physician: Fulton Reek, MD  Jorge Butler is an 89 y.o. male.  HPI:  Patient presented to urgent care today with concerns of continued insomnia.  He was noted to be significantly hyperglycemic and was sent to the emergency department.  Upon further evaluation he was felt to be distended and noted to have had an episode of small-volume nausea and vomiting.  He subsequently underwent a CT scan and had some scattered areas of pneumatosis.  We are asked to consult.  Upon further questioning, the patient was doing well until Monday.  He started having significant rhinorrhea that day.  That evening he had to get up every hour to wipe his nose and to urinate.  This continued for the next day.  He says his rhinorrhea has resolved, but he has been unable to sleep and continues to have to urinate often.  He has been eating a normal diet during this time.  His family notes that he does eat healthy foods at a normal amount.  The patient is also extremely sharp and has not had any change in his mental status according to his family.  He also is still getting around well.  He has some bloating, but he and his family say it is mild and that he normally has a protuberant abdomen.    Of note, he was found to have a glucose of 325 and significant glycosuria.  Past Medical History:  Diagnosis Date   Diabetes mellitus without complication (HCC)    Enlarged prostate    High cholesterol    Thyroid disease     Past Surgical History:  Procedure Laterality Date   pituitory       Family History  Problem Relation Age of Onset   Heart disease Mother    Eczema Father    COPD Sister    Colon cancer Brother    Alcohol abuse Brother    Alcohol abuse Brother     Social History:  reports that he has never smoked. He has never used smokeless tobacco. No history on file for alcohol use and drug use.  Allergies:  Allergies  Allergen Reactions   Sulfa Antibiotics  Nausea And Vomiting   Sulfamethoxazole-Trimethoprim Nausea And Vomiting    Medications:  aspirin EC 81 MG tablet atorvastatin (LIPITOR) 20 MG tablet finasteride (PROSCAR) 5 MG tablet glipiZIDE (GLUCOTROL) 5 MG tablet levothyroxine (SYNTHROID) 88 MCG tablet neomycin-bacitracin-polymyxin (NEOSPORIN) ointment pioglitazone (ACTOS) 45 MG tablet tamsulosin (FLOMAX) 0.4 MG CAPS capsule  Results for orders placed or performed during the hospital encounter of 04/16/23 (from the past 48 hour(s))  CBG monitoring, ED     Status: Abnormal   Collection Time: 04/16/23 12:29 PM  Result Value Ref Range   Glucose-Capillary 478 (H) 70 - 99 mg/dL    Comment: Glucose reference range applies only to samples taken after fasting for at least 8 hours.   Comment 1 Notify RN    Comment 2 Document in Chart   Comprehensive metabolic panel     Status: Abnormal   Collection Time: 04/16/23  1:15 PM  Result Value Ref Range   Sodium 126 (L) 135 - 145 mmol/L   Potassium 5.3 (H) 3.5 - 5.1 mmol/L    Comment: HEMOLYSIS AT THIS LEVEL MAY AFFECT RESULT   Chloride 94 (L) 98 - 111 mmol/L   CO2 19 (L) 22 - 32 mmol/L   Glucose, Bld 438 (H) 70 - 99 mg/dL    Comment: Glucose reference range applies  only to samples taken after fasting for at least 8 hours.   BUN 45 (H) 8 - 23 mg/dL   Creatinine, Ser 1.61 (H) 0.61 - 1.24 mg/dL   Calcium 8.7 (L) 8.9 - 10.3 mg/dL   Total Protein 6.9 6.5 - 8.1 g/dL   Albumin 3.4 (L) 3.5 - 5.0 g/dL   AST 29 15 - 41 U/L    Comment: HEMOLYSIS AT THIS LEVEL MAY AFFECT RESULT   ALT 19 0 - 44 U/L    Comment: HEMOLYSIS AT THIS LEVEL MAY AFFECT RESULT   Alkaline Phosphatase 66 38 - 126 U/L   Total Bilirubin 1.6 (H) 0.3 - 1.2 mg/dL    Comment: HEMOLYSIS AT THIS LEVEL MAY AFFECT RESULT   GFR, Estimated 26 (L) >60 mL/min    Comment: (NOTE) Calculated using the CKD-EPI Creatinine Equation (2021)    Anion gap 13 5 - 15    Comment: Performed at [facility], [address],  [place], [place] [address]  Lipase, blood     Status: None   Collection Time: 04/16/23  1:15 PM  Result Value Ref Range   Lipase 28 11 - 51 U/L    Comment: Performed at [facility], [address], [place], [place] [address]  Blood gas, venous (at [facility])     Status: Abnormal   Collection Time: 04/16/23  1:15 PM  Result Value Ref Range   pH, Ven 7.37 7.25 - 7.43   pCO2, Ven 38 (L) 44 - 60 mmHg   pO2, Ven 68 (H) 32 - 45 mmHg   Bicarbonate 22.0 20.0 - 28.0 mmol/L   Acid-base deficit 2.9 (H) 0.0 - 2.0 mmol/L   O2 Saturation 93.3 %   Patient temperature 37.0    Drawn by [id number]     Comment: Performed at [facility], [address], [place], [place] [address]  I-Stat Lactic Acid     Status: Abnormal   Collection Time: 04/16/23  1:25 PM  Result Value Ref Range   Lactic Acid, Venous 2.5 (HH) 0.5 - 1.9 mmol/L   Comment NOTIFIED PHYSICIAN   I-stat chem 8, ED     Status: Abnormal   Collection Time: 04/16/23  1:43 PM  Result Value Ref Range   Sodium 128 (L) 135 - 145 mmol/L   Potassium 4.8 3.5 - 5.1 mmol/L   Chloride 96 (L) 98 - 111 mmol/L   BUN 48 (H) 8 - 23 mg/dL   Creatinine, Ser 6.21 (H) 0.61 - 1.24 mg/dL   Glucose, Bld 308 (H) 70 - 99 mg/dL    Comment: Glucose reference range applies only to samples taken after fasting for at least 8 hours.   Calcium, Ion 1.13 (L) 1.15 - 1.40 mmol/L   TCO2 23 22 - 32 mmol/L   Hemoglobin 15.0 13.0 - 17.0 g/dL   HCT 65.7 84.6 - 96.2 %  Urinalysis, Routine w reflex microscopic -Urine, Clean Catch     Status: Abnormal   Collection Time: 04/16/23  1:57 PM  Result Value Ref Range   Color, Urine YELLOW YELLOW   APPearance CLEAR CLEAR   Specific Gravity, Urine 1.018 1.005 - 1.030   pH 6.0 5.0 - 8.0   Glucose, UA >=500 (A) NEGATIVE mg/dL   Hgb urine dipstick NEGATIVE NEGATIVE   Bilirubin Urine NEGATIVE NEGATIVE   Ketones, ur 5 (A) NEGATIVE mg/dL   Protein, ur 30 (A) NEGATIVE mg/dL   Nitrite NEGATIVE NEGATIVE    Leukocytes,Ua  NEGATIVE NEGATIVE   RBC / HPF 0-5 0 - 5 RBC/hpf   WBC, UA 0-5 0 - 5 WBC/hpf   Bacteria, UA NONE SEEN NONE SEEN   Squamous Epithelial / HPF 0-5 0 - 5 /HPF    Comment: Performed at [facility], [address] [place], [place] [address]  CBC with Differential/Platelet     Status: Abnormal   Collection Time: 04/16/23  2:13 PM  Result Value Ref Range   WBC 12.4 (H) 4.0 - 10.5 K/uL   RBC 4.38 4.22 - 5.81 MIL/uL   Hemoglobin 13.2 13.0 - 17.0 g/dL   HCT 78.4 69.6 - 29.5 %   MCV 92.7 80.0 - 100.0 fL   MCH 30.1 26.0 - 34.0 pg   MCHC 32.5 30.0 - 36.0 g/dL   RDW 28.4 13.2 - 44.0 %   Platelets 211 150 - 400 K/uL   nRBC 0.0 0.0 - 0.2 %   Neutrophils Relative % 84 %   Neutro Abs 10.4 (H) 1.7 - 7.7 K/uL   Lymphocytes Relative 5 %   Lymphs Abs 0.7 0.7 - 4.0 K/uL   Monocytes Relative 10 %   Monocytes Absolute 1.2 (H) 0.1 - 1.0 K/uL   Eosinophils Relative 0 %   Eosinophils Absolute 0.0 0.0 - 0.5 K/uL   Basophils Relative 0 %   Basophils Absolute 0.0 0.0 - 0.1 K/uL   Immature Granulocytes 1 %   Abs Immature Granulocytes 0.06 0.00 - 0.07 K/uL    Comment: Performed at [facility], [address], [place], [place] [address]  I-Stat Lactic Acid     Status: None   Collection Time: 04/16/23  3:38 PM  Result Value Ref Range   Lactic Acid, Venous 1.9 0.5 - 1.9 mmol/L  POC CBG, ED     Status: Abnormal   Collection Time: 04/16/23  4:14 PM  Result Value Ref Range   Glucose-Capillary 325 (H) 70 - 99 mg/dL    Comment: Glucose reference range applies only to samples taken after fasting for at least 8 hours.    CT ABDOMEN PELVIS WO CONTRAST  Result Date: 04/16/2023 CLINICAL DATA:  Abdominal distention.  Constipation EXAM: CT ABDOMEN AND PELVIS WITHOUT CONTRAST TECHNIQUE: Multidetector CT imaging of the abdomen and pelvis was performed following the standard protocol without IV contrast. RADIATION DOSE REDUCTION: This exam was performed according to the  departmental dose-optimization program which includes automated exposure control, adjustment of the mA and/or kV according to patient size and/or use of iterative reconstruction technique. COMPARISON:  None Available. FINDINGS: Lower chest: There is some linear opacity lung bases likely scar or atelectasis. Breathing motion. Trace right-sided pleural fluid. Coronary artery calcifications are seen. Hepatobiliary: On this non IV contrast exam, grossly the liver is preserved. Gallbladder is nondilated. Pancreas: Global atrophy of the pancreas.  No obvious mass. Spleen: Normal in size without focal abnormality. Adrenals/Urinary Tract: Adrenal glands are preserved. Mild bilateral renal atrophy. No abnormal calcification seen within either kidney nor along the course of either ureter. Preserved contours of the urinary bladder. Bladder wall slightly thickened and trabeculated with some small bladder diverticula. Stomach/Bowel: No oral contrast. Large bowel has scattered colonic stool with diverticula. Large bowel is nondilated. Stomach is distended with fluid and air. Small bowel has several loops which are dilated with air-fluid levels measuring up to 3.3 cm. Several diverticula seen. There is some fluid and stranding in the right hemi abdominal mesentery. There is also some areas of pneumatosis along the  small bowel and scattered areas such as left hemiabdomen anteriorly on series 4, image 34 and right hemiabdomen on series 2, image 39. Possibilities would include areas of bowel ischemia. Please correlate clinical presentation. No free air. No clear portal venous gas at this time. Vascular/Lymphatic: Scattered vascular calcifications along the aorta and branch vessels. Normal caliber aorta and IVC. Reproductive: Enlarged prostate. Please correlate with the patient's history and PSA. Mass effect along the bladder. Other: Small fat containing right inguinal hernia.  Mild ascites. Musculoskeletal: Mild degenerative changes  along the spine. Critical Value/emergent results were called by telephone at the time of interpretation on 04/16/2023 at 1:39 pm to provider [facility] DAVIS , who verbally acknowledged these results. IMPRESSION: Diffusely dilated small bowel without transition point. There is some areas of pneumatosis, mesenteric stranding and trace ascites. Please correlate for etiology including evidence of bowel ischemia. A postcontrast study may be of some benefit due to further delineate as clinically appropriate. Scattered small and large bowel diverticula. Tiny pleural effusion. Motion Enlarged prostate with mass effect along the bladder. Bladder wall is thickened and trabeculated with some small diverticula Electronically Signed   By: Karen Kays M.D.   On: 04/16/2023 16:40   DG Chest 2 View  Result Date: 04/16/2023 CLINICAL DATA:  Hyperglycemia. EXAM: CHEST - 2 VIEW COMPARISON:  None Available. FINDINGS: No consolidation, pneumothorax or effusion. Normal cardiopericardial silhouette without edema. Degenerative changes of the spine. Overlapping cardiac leads. Air-fluid level along the stomach beneath the left hemidiaphragm. IMPRESSION: Hyperinflation.  No acute cardiopulmonary disease. Electronically Signed   By: Karen Kays M.D.   On: 04/16/2023 13:52   DG Abd 1 View  Result Date: 04/16/2023 CLINICAL DATA:  Acute generalized abdominal pain. EXAM: ABDOMEN - 1 VIEW COMPARISON:  None Available. FINDINGS: Dilated small bowel loops are noted with air-fluid levels concerning for distal small bowel obstruction. Air-filled colon is noted as well. No definite free air is noted. IMPRESSION: Small bowel dilatation with air-fluid levels is noted concerning for distal small bowel obstruction. CT scan is recommended for further evaluation. Electronically Signed   By: Lupita Raider M.D.   On: 04/16/2023 12:37    Review of Systems  Constitutional:  Negative for appetite change, chills, diaphoresis and fatigue.       Insomnia   HENT:  Positive for rhinorrhea.   Eyes: Negative.   Respiratory: Negative.    Cardiovascular: Negative.   Gastrointestinal:  Positive for abdominal distention, constipation, nausea and vomiting. Negative for abdominal pain, blood in stool, diarrhea and rectal pain.  Endocrine: Negative.   Genitourinary:  Positive for frequency.  Musculoskeletal: Negative.   Skin: Negative.   Allergic/Immunologic: Negative.   Neurological: Negative.   Hematological: Negative.   Psychiatric/Behavioral:  Positive for sleep disturbance.   All other systems reviewed and are negative.  Blood pressure (!) 149/64, pulse 98, temperature 98.3 F (36.8 C), temperature source Oral, resp. rate (!) 22, height 5\' 9"  (1.753 m), weight 77 kg, SpO2 96%. Physical Exam Vitals reviewed.  Constitutional:      General: He is not in acute distress.    Appearance: Normal appearance. He is not ill-appearing, toxic-appearing or diaphoretic.  HENT:     Head: Normocephalic and atraumatic.     Right Ear: External ear normal.     Left Ear: External ear normal.     Nose: No congestion or rhinorrhea.     Mouth/Throat:     Mouth: Mucous membranes are dry.     Pharynx: No oropharyngeal  exudate or posterior oropharyngeal erythema.  Eyes:     General: No scleral icterus.       Right eye: No discharge.        Left eye: No discharge.     Extraocular Movements: Extraocular movements intact.     Conjunctiva/sclera: Conjunctivae normal.     Pupils: Pupils are equal, round, and reactive to light.  Cardiovascular:     Rate and Rhythm: Normal rate and regular rhythm.     Pulses: Normal pulses.  Pulmonary:     Effort: Pulmonary effort is normal. No respiratory distress.     Breath sounds: No stridor.  Chest:     Chest wall: No tenderness.  Abdominal:     Comments: protuberant  Musculoskeletal:     Cervical back: Neck supple.  Neurological:     Mental Status: He is alert.      Assessment/Plan:  Pneumatosis Hyperglycemia Glycosuria Hyponatremia Hyperkalemia Acute kidney injury Dehydration  The patient does have scattered pneumatosis on his CT scan, but he has no abdominal pain or tenderness.  He also continues to have mild appetite.  He does not appear to have clinical evidence of bowel ischemia.  I suspect the pneumatosis is related to dehydration.  I do think he has had some viral infection given the rhinorrhea that he suddenly had for 1 to 2 days earlier.  He also has had significant hyperglycemia and has been urinating elevated amounts.  I think that this will resolve with bowel rest, rehydration, and IV antibiotics.  He does not currently appear to have an infection related this, but there is a high risk of translocation and sepsis with pneumatosis if antibiotics are not on board.  Surgery will follow.  No acute need for surgical intervention.  Almond Lint 04/16/2023, 6:43 PM

## 2023-04-16 NOTE — ED Notes (Addendum)
Pt aware urine sample is needed 

## 2023-04-17 DIAGNOSIS — N179 Acute kidney failure, unspecified: Principal | ICD-10-CM

## 2023-04-17 LAB — COMPREHENSIVE METABOLIC PANEL
ALT: 17 U/L (ref 0–44)
AST: 17 U/L (ref 15–41)
Alkaline Phosphatase: 45 U/L (ref 38–126)
Anion gap: 9 (ref 5–15)
BUN: 41 mg/dL — ABNORMAL HIGH (ref 8–23)
CO2: 22 mmol/L (ref 22–32)
Calcium: 8.3 mg/dL — ABNORMAL LOW (ref 8.9–10.3)
Chloride: 101 mmol/L (ref 98–111)
Creatinine, Ser: 1.92 mg/dL — ABNORMAL HIGH (ref 0.61–1.24)
GFR, Estimated: 33 mL/min — ABNORMAL LOW (ref >60)
Glucose, Bld: 168 mg/dL — ABNORMAL HIGH (ref 70–99)
Potassium: 4.6 mmol/L (ref 3.5–5.1)
Sodium: 132 mmol/L — ABNORMAL LOW (ref 135–145)
Total Bilirubin: 0.9 mg/dL (ref 0.3–1.2)
Total Protein: 5.9 g/dL — ABNORMAL LOW (ref 6.5–8.1)

## 2023-04-17 LAB — CBC
HCT: 40 % (ref 39.0–52.0)
MCH: 30.4 pg (ref 26.0–34.0)
MCHC: 32 g/dL (ref 30.0–36.0)
MCV: 95 fL (ref 80.0–100.0)
Platelets: 240 10*3/uL (ref 150–400)
RBC: 4.21 MIL/uL — ABNORMAL LOW (ref 4.22–5.81)
RDW: 13.9 % (ref 11.5–15.5)
WBC: 12.1 10*3/uL — ABNORMAL HIGH (ref 4.0–10.5)
nRBC: 0 % (ref 0.0–0.2)

## 2023-04-17 LAB — CULTURE, BLOOD (ROUTINE X 2)
Culture: NO GROWTH
Culture: NO GROWTH
Special Requests: ADEQUATE
Special Requests: ADEQUATE

## 2023-04-17 LAB — GLUCOSE, CAPILLARY
Glucose-Capillary: 170 mg/dL — ABNORMAL HIGH (ref 70–99)
Glucose-Capillary: 131 mg/dL — ABNORMAL HIGH (ref 70–99)
Glucose-Capillary: 100 mg/dL — ABNORMAL HIGH (ref 70–99)
Glucose-Capillary: 114 mg/dL — ABNORMAL HIGH (ref 70–99)

## 2023-04-17 MED ORDER — MELATONIN 5 MG PO TABS
5.0000 mg | ORAL_TABLET | Freq: Every evening | ORAL | Status: DC | PRN
  Administered 2023-04-17 – 2023-04-18 (×2): 5 mg via ORAL
  Filled 2023-04-17 (×2): qty 1

## 2023-04-17 MED ORDER — CHLORHEXIDINE GLUCONATE CLOTH 2 % EX PADS
6.0000 | MEDICATED_PAD | Freq: Every day | CUTANEOUS | Status: DC
  Administered 2023-04-17 – 2023-04-19 (×3): 6 via TOPICAL

## 2023-04-17 MED ORDER — POLYETHYLENE GLYCOL 3350 17 G PO PACK
17.0000 g | PACK | Freq: Every day | ORAL | Status: DC
  Administered 2023-04-17 – 2023-04-18 (×2): 17 g via ORAL
  Filled 2023-04-17 (×2): qty 1

## 2023-04-17 MED ORDER — ATORVASTATIN CALCIUM 20 MG PO TABS
20.0000 mg | ORAL_TABLET | Freq: Every evening | ORAL | Status: DC
  Administered 2023-04-17 – 2023-04-18 (×2): 20 mg via ORAL
  Filled 2023-04-17 (×2): qty 1

## 2023-04-17 NOTE — TOC Initial Note (Signed)
Transition of Care [facility]) - Initial/Assessment Note    Patient Details  Name: Jorge Butler MRN: [id number] Date of Birth: 16-May-1932  Transition of [facility]) CM/SW Contact:    Lanier Clam, RN Phone Number: 04/17/2023, 12:23 PM  Clinical Narrative: d/c plan home.                  Expected Discharge Plan: Home/Self Care Barriers to Discharge: Continued Medical Work up   Patient Goals and CMS Choice Patient states their goals for this hospitalization and ongoing recovery are:: Home CMS Medicare.gov Compare Post Acute Care list provided to:: Patient Choice offered to / list presented to : Patient [place] ownership interest in [facility].provided to:: Patient    Expected Discharge Plan and Services   Discharge Planning Services: CM Consult   Living arrangements for the past 2 months: Single Family Home                                      Prior Living Arrangements/Services Living arrangements for the past 2 months: Single Family Home Lives with:: Self Patient language and need for interpreter reviewed:: Yes Do you feel safe going back to the place where you live?: Yes      Need for Family Participation in Patient Care: Yes (Comment) Care giver support system in place?: Yes (comment)   Criminal Activity/Legal Involvement Pertinent to Current Situation/Hospitalization: No - Comment as needed  Activities of Daily Living Home Assistive Devices/Equipment: None ADL Screening (condition at time of admission) Patient's cognitive ability adequate to safely complete daily activities?: Yes Is the patient deaf or have difficulty hearing?: No Does the patient have difficulty seeing, even when wearing glasses/contacts?: No Does the patient have difficulty concentrating, remembering, or making decisions?: No Patient able to express need for assistance with ADLs?: Yes Does the patient have difficulty dressing or bathing?: No Independently performs ADLs?:  Yes (appropriate for developmental age) Does the patient have difficulty walking or climbing stairs?: No Weakness of Legs: None Weakness of Arms/Hands: None  Permission Sought/Granted Permission sought to share information with : Case Manager Permission granted to share information with : Yes, Verbal Permission Granted  Share Information with NAME: Case Manager           Emotional Assessment Appearance:: Appears stated age Attitude/Demeanor/Rapport: Gracious Affect (typically observed): Accepting Orientation: : Oriented to Self, Oriented to Place, Oriented to  Time, Oriented to Situation Alcohol / Substance Use: Not Applicable Psych Involvement: No (comment)  Admission diagnosis:  AKI (acute kidney injury) (HCC) [N17.9] Patient Active Problem List   Diagnosis Date Noted   AKI (acute kidney injury) (HCC) 04/16/2023   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   Glaucoma 04/16/2023   Shuffling gait 04/16/2023   Hypertension 04/16/2023   Hypothyroidism 04/16/2023   Hyperlipidemia 04/16/2023   Elevated PSA 04/16/2023   Type 2 diabetes mellitus with hyperglycemia (HCC) 04/16/2023   Cerebrovascular accident (CVA) (HCC) 04/16/2023   Hyperbilirubinemia 04/16/2023   Pseudohyponatremia 04/16/2023   Mild protein-calorie malnutrition (HCC) 04/16/2023   Hyperkalemia 04/16/2023   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/16/2023   PCP:  Creola Corn, MD Pharmacy:   [id number] - [place], [place] - [address] AT [facility] OF [address] & [address] [place] [address] Phone: [phone] Fax: [phone]     Social Determinants of Health (SDOH) Social History: SDOH Screenings   Food Insecurity: No Food  Insecurity (04/16/2023)  Housing: Low Risk  (04/16/2023)  Transportation Needs: No Transportation Needs (04/16/2023)  Utilities: Not At Risk (04/16/2023)  Tobacco Use: Low Risk  (04/16/2023)   SDOH Interventions:     Readmission Risk Interventions      No data to display

## 2023-04-17 NOTE — Plan of Care (Signed)
  Problem: Education: Goal: Ability to describe self-care measures that may prevent or decrease complications (Diabetes Survival Skills Education) will improve Outcome: Progressing   Problem: Coping: Goal: Ability to adjust to condition or change in health will improve Outcome: Progressing   Problem: Fluid Volume: Goal: Ability to maintain a balanced intake and output will improve Outcome: Progressing   Problem: Health Behavior/Discharge Planning: Goal: Ability to identify and utilize available resources and services will improve Outcome: Progressing Goal: Ability to manage health-related needs will improve Outcome: Progressing   Problem: Metabolic: Goal: Ability to maintain appropriate glucose levels will improve Outcome: Progressing   Problem: Nutritional: Goal: Progress toward achieving an optimal weight will improve Outcome: Progressing   Problem: Skin Integrity: Goal: Risk for impaired skin integrity will decrease Outcome: Progressing   Problem: Tissue Perfusion: Goal: Adequacy of tissue perfusion will improve Outcome: Progressing   Problem: Education: Goal: Knowledge of General Education information will improve Description: Including pain rating scale, medication(s)/side effects and non-pharmacologic comfort measures Outcome: Progressing   Problem: Health Behavior/Discharge Planning: Goal: Ability to manage health-related needs will improve Outcome: Progressing   Problem: Clinical Measurements: Goal: Ability to maintain clinical measurements within normal limits will improve Outcome: Progressing Goal: Will remain free from infection Outcome: Progressing Goal: Diagnostic test results will improve Outcome: Progressing Goal: Respiratory complications will improve Outcome: Progressing Goal: Cardiovascular complication will be avoided Outcome: Progressing   Problem: Activity: Goal: Risk for activity intolerance will decrease Outcome: Progressing   Problem:  Nutrition: Goal: Adequate nutrition will be maintained Outcome: Progressing   Problem: Coping: Goal: Level of anxiety will decrease Outcome: Progressing   Problem: Elimination: Goal: Will not experience complications related to bowel motility Outcome: Progressing Goal: Will not experience complications related to urinary retention Outcome: Progressing   Problem: Pain Managment: Goal: General experience of comfort will improve Outcome: Progressing   Problem: Safety: Goal: Ability to remain free from injury will improve Outcome: Progressing   Problem: Skin Integrity: Goal: Risk for impaired skin integrity will decrease Outcome: Progressing

## 2023-04-17 NOTE — Progress Notes (Signed)
Mobility Specialist - Progress Note   04/17/23 1552  Mobility  Activity Ambulated independently in hallway  Level of Assistance Contact guard assist, steadying assist  Assistive Device None  Distance Ambulated (ft) 500 ft  Range of Motion/Exercises Active Assistive  Activity Response Tolerated well  Mobility Referral Yes  $Mobility charge 1 Mobility  Mobility Specialist Start Time (ACUTE ONLY) 1540  Mobility Specialist Stop Time (ACUTE ONLY) 1552  Mobility Specialist Time Calculation (min) (ACUTE ONLY) 12 min   Pt was found on recliner chair and agreeable to ambulate. Is a little unsteady going from STS but able to steady himself after a couple seconds. Had x1 LOB during session that he was able to self correct. At EOS returned to recliner chair with all needs met and call bell in reach.   Billey Chang Mobility Specialist

## 2023-04-17 NOTE — Progress Notes (Signed)
Progress Note   Patient: Jorge Butler MVH:[id number] DOB: 10-06-1931 DOA: 04/16/2023     1 DOS: the patient was seen and examined on 04/17/2023    Subjective:  Patient seen and examined at bedside this morning Admits to improvement in abdominal pain Denies nausea and vomiting Admits to constipation for which MiraLAX has been ordered Denies chest pain or urinary complaints   Brief hospital course: Jorge Butler is a 89 y.o. male with medical history significant of type 2 diabetes, BPH, hyperlipidemia, hypothyroidism who was sent from urgent care to the emergency department due to hyperglycemia.  He initially presented to the urgent care center with complaints of not sleeping well for the past 2 nights, and some GI discomfort which made the patient think that he may have "caught a bug" while he was doing his shopping at the grocery store a couple of days ago.  He was subsequently referred to the emergency department due to hyperglycemia and AKI.  Patient now comes in with abdominal discomfort. CT scan of the abdomen obtained on admission showed findings of pneumatosis intestinalis and subsequently patient seen by general surgeon and given high risks of translocation in this condition antibiotics have been initiated.  Hospitalist service consulted to assist with patient's management    Assessment and Plan:  AKI (acute kidney injury) (HCC) Continue telemetry monitoring Continue supplemental oxygen Monitor renal function closely Monitor input and output Avoid nephrotoxic medications      Pneumatosis intestinalis  Surgeon on board and case discussed Currently n.p.o. we will defer initiation of feeding to surgeon Continue supplemental IV hydration. Monitor electrolytes closely  CT scan of the abdomen obtained on admission showed findings of pneumatosis intestinalis and subsequently patient seen by general surgeon and given high risks of translocation in this condition  antibiotics have been initiated.      Type 2 diabetes mellitus with hyperglycemia (HCC) Improving with IV fluids. The patient is currently n.p.o. Will continue IV fluids and monitor CBG. Continue to hold glipizide 5 mg p.o. daily until back on diet. Continue to hold Sitagliptin due to renal function. Continue current insulin regimen     Hyperkalemia Continue repletion and monitoring     Hyperbilirubinemia In the setting of dehydration. Recheck once hydrated.     Pseudohyponatremia Continue supplemental IV fluid Monitor sodium level closely     Mild protein-calorie malnutrition (HCC) Protein supplementation once cleared for diet. Consider nutritional services evaluation.     Hypertension Parenteral antihypertensives as needed. Currently relatively hypotensive     Hypothyroidism Continue levothyroxine levothyroxine 88 mcg p.o. daily.     Hyperlipidemia Continue atorvastatin     Elevated PSA Continue tamsulosin Follow-up with outpatient urologist     Glaucoma Currently not on eyedrops. Follow-up with ophthalmologist as an outpatient   CODE STATUS: Full code   Family Communication: His godson was at bedside.     Physical Exam: General: Laying in bed acute ill looking HENT: Normocephalic atraumatic    Vascular: No JVD.  Cardiovascular: Normal S1 and S2  Pulmonary: Clear to auscultation bilaterally Abdominal:     General: Abdomen is protuberant.  Nontender Musculoskeletal:     Cervical back: Neck supple.     Right lower leg: No edema.     Left lower leg: No edema.  Skin:    General: Skin is warm and dry.  Neurological:     General: No focal deficit present.     Mental Status: He is alert and oriented to person, place, and time.  Psychiatric:        Mood and Affect: Mood normal.        Behavior: Behavior normal. Behavior is cooperative.   Vitals:   04/16/23 2233 04/17/23 0252 04/17/23 0655 04/17/23 1209  BP: (!) 125/52 131/67 138/71 (!) 117/59  Pulse:  87 94 94 83  Resp:    16  Temp: 98.4 F (36.9 C)  98.3 F (36.8 C) 98.1 F (36.7 C)  TempSrc: Oral  Oral Oral  SpO2: 96% 97% 95% 94%  Weight:      Height:        Data Reviewed: I have reviewed patient's CT scan of the abdomen, lab results, including CBC, CMP, previous medical records as well as surgeon documentation nursing documentation and vitals   Author: Loyce Dys, MD 04/17/2023 6:01 PM  For on call review [email or web address].

## 2023-04-17 NOTE — Progress Notes (Signed)
Subjective/Chief Complaint: Pt feeling quite a bit better.  Denies abdominal pain and nausea/vomiting.  Hasn't taken any antiemetics or pain meds including no tylenol.     Objective: Vital signs in last 24 hours: Temp:  [97.7 F (36.5 C)-98.4 F (36.9 C)] 98.3 F (36.8 C) (07/25 0655) Pulse Rate:  [87-113] 94 (07/25 0655) Resp:  [18-33] 20 (07/24 1855) BP: (105-149)/(52-71) 138/71 (07/25 0655) SpO2:  [92 %-98 %] 95 % (07/25 0655) Weight:  [77 kg] 77 kg (07/24 1233) Last BM Date : 04/14/23  Intake/Output from previous day: 07/24 0701 - 07/25 0700 In: 1000 [I.V.:950; IV Piggyback:50] Out: 800 [Urine:800] Intake/Output this shift: No intake/output data recorded.  General:  alert and oriented x 3< NAD Resp: breathing comfortably. Abd: soft, less distended, hypoactive BS Ext: warm, well perfused  Lab Results:  Recent Labs    04/16/23 1413 04/17/23 0447  WBC 12.4* 12.1*  HGB 13.2 12.8*  HCT 40.6 40.0  PLT 211 240   BMET Recent Labs    04/16/23 1906 04/17/23 0447  NA 132* 132*  K 4.5 4.6  CL 99 101  CO2 23 22  GLUCOSE 227* 168*  BUN 43* 41*  CREATININE 2.07* 1.92*  CALCIUM 8.7* 8.3*   PT/INR No results for input(s): "LABPROT", "INR" in the last 72 hours. ABG Recent Labs    04/16/23 1315  HCO3 22.0    Studies/Results: CT ABDOMEN PELVIS WO CONTRAST  Result Date: 04/16/2023 CLINICAL DATA:  Abdominal distention.  Constipation EXAM: CT ABDOMEN AND PELVIS WITHOUT CONTRAST TECHNIQUE: Multidetector CT imaging of the abdomen and pelvis was performed following the standard protocol without IV contrast. RADIATION DOSE REDUCTION: This exam was performed according to the departmental dose-optimization program which includes automated exposure control, adjustment of the mA and/or kV according to patient size and/or use of iterative reconstruction technique. COMPARISON:  None Available. FINDINGS: Lower chest: There is some linear opacity lung bases likely scar or  atelectasis. Breathing motion. Trace right-sided pleural fluid. Coronary artery calcifications are seen. Hepatobiliary: On this non IV contrast exam, grossly the liver is preserved. Gallbladder is nondilated. Pancreas: Global atrophy of the pancreas.  No obvious mass. Spleen: Normal in size without focal abnormality. Adrenals/Urinary Tract: Adrenal glands are preserved. Mild bilateral renal atrophy. No abnormal calcification seen within either kidney nor along the course of either ureter. Preserved contours of the urinary bladder. Bladder wall slightly thickened and trabeculated with some small bladder diverticula. Stomach/Bowel: No oral contrast. Large bowel has scattered colonic stool with diverticula. Large bowel is nondilated. Stomach is distended with fluid and air. Small bowel has several loops which are dilated with air-fluid levels measuring up to 3.3 cm. Several diverticula seen. There is some fluid and stranding in the right hemi abdominal mesentery. There is also some areas of pneumatosis along the small bowel and scattered areas such as left hemiabdomen anteriorly on series 4, image 34 and right hemiabdomen on series 2, image 39. Possibilities would include areas of bowel ischemia. Please correlate clinical presentation. No free air. No clear portal venous gas at this time. Vascular/Lymphatic: Scattered vascular calcifications along the aorta and branch vessels. Normal caliber aorta and IVC. Reproductive: Enlarged prostate. Please correlate with the patient's history and PSA. Mass effect along the bladder. Other: Small fat containing right inguinal hernia.  Mild ascites. Musculoskeletal: Mild degenerative changes along the spine. Critical Value/emergent results were called by telephone at the time of interpretation on 04/16/2023 at 1:39 pm to provider [facility] DAVIS , who verbally acknowledged these  results. IMPRESSION: Diffusely dilated small bowel without transition point. There is some areas of  pneumatosis, mesenteric stranding and trace ascites. Please correlate for etiology including evidence of bowel ischemia. A postcontrast study may be of some benefit due to further delineate as clinically appropriate. Scattered small and large bowel diverticula. Tiny pleural effusion. Motion Enlarged prostate with mass effect along the bladder. Bladder wall is thickened and trabeculated with some small diverticula Electronically Signed   By: Karen Kays M.D.   On: 04/16/2023 16:40   DG Chest 2 View  Result Date: 04/16/2023 CLINICAL DATA:  Hyperglycemia. EXAM: CHEST - 2 VIEW COMPARISON:  None Available. FINDINGS: No consolidation, pneumothorax or effusion. Normal cardiopericardial silhouette without edema. Degenerative changes of the spine. Overlapping cardiac leads. Air-fluid level along the stomach beneath the left hemidiaphragm. IMPRESSION: Hyperinflation.  No acute cardiopulmonary disease. Electronically Signed   By: Karen Kays M.D.   On: 04/16/2023 13:52   DG Abd 1 View  Result Date: 04/16/2023 CLINICAL DATA:  Acute generalized abdominal pain. EXAM: ABDOMEN - 1 VIEW COMPARISON:  None Available. FINDINGS: Dilated small bowel loops are noted with air-fluid levels concerning for distal small bowel obstruction. Air-filled colon is noted as well. No definite free air is noted. IMPRESSION: Small bowel dilatation with air-fluid levels is noted concerning for distal small bowel obstruction. CT scan is recommended for further evaluation. Electronically Signed   By: Lupita Raider M.D.   On: 04/16/2023 12:37    Anti-infectives: Anti-infectives (From admission, onward)    Start     Dose/Rate Route Frequency Ordered Stop   04/17/23 0200  piperacillin-tazobactam (ZOSYN) IVPB 3.375 g  Status:  Discontinued        3.375 g 100 mL/hr over 30 Minutes Intravenous Every 8 hours 04/16/23 1833 04/16/23 2040   04/17/23 0200  piperacillin-tazobactam (ZOSYN) IVPB 3.375 g        3.375 g 12.5 mL/hr over 240 Minutes  Intravenous Every 8 hours 04/16/23 2042     04/16/23 1715  piperacillin-tazobactam (ZOSYN) IVPB 3.375 g        3.375 g 100 mL/hr over 30 Minutes Intravenous  Once 04/16/23 1702 04/16/23 2039       Assessment/Plan: Pneumatosis Dehydration Hyperglycemia Hyponatremia Hyperkalemia AKI  Abdominal exam was good yesterday, but even better today without distention. Considering the level of pneumatosis present, would continue NPO today other than sips of clears.   As long as stable, restart diet tomorrow.  Continue IV antibiotics.     LOS: 1 day    Almond Lint 04/17/2023

## 2023-04-17 NOTE — Progress Notes (Signed)
Mobility Specialist - Progress Note   04/17/23 1223  Mobility  Activity Ambulated independently in hallway  Level of Assistance Standby assist, set-up cues, supervision of patient - no hands on  Assistive Device None  Distance Ambulated (ft) 200 ft  Range of Motion/Exercises Active Assistive  Activity Response Tolerated well  Mobility Referral Yes  $Mobility charge 1 Mobility  Mobility Specialist Start Time (ACUTE ONLY) 1210  Mobility Specialist Stop Time (ACUTE ONLY) 1223  Mobility Specialist Time Calculation (min) (ACUTE ONLY) 13 min   Pt was found in bed and agreeable to ambulate. Pt got assistance from family member in room to go from STS and took a couple of seconds get stable. Pt able to ambulate in hallway without complaints. At EOS returned to recliner chair with call bell in reach. RN in room.  Billey Chang Mobility Specialist

## 2023-04-18 DIAGNOSIS — N179 Acute kidney failure, unspecified: Secondary | ICD-10-CM | POA: Diagnosis not present

## 2023-04-18 LAB — GLUCOSE, CAPILLARY
Glucose-Capillary: 71 mg/dL (ref 70–99)
Glucose-Capillary: 235 mg/dL — ABNORMAL HIGH (ref 70–99)
Glucose-Capillary: 165 mg/dL — ABNORMAL HIGH (ref 70–99)
Glucose-Capillary: 214 mg/dL — ABNORMAL HIGH (ref 70–99)

## 2023-04-18 NOTE — Progress Notes (Signed)
Subjective/Chief Complaint: Doing better.  No n/v. No abd pain.     Objective: Vital signs in last 24 hours: Temp:  [97.6 F (36.4 C)-98.2 F (36.8 C)] 97.6 F (36.4 C) (07/26 0634) Pulse Rate:  [82-88] 88 (07/26 0634) Resp:  [16-19] 19 (07/26 0634) BP: (117-145)/(57-64) 145/64 (07/26 0634) SpO2:  [94 %-100 %] 95 % (07/26 0634) Last BM Date : 04/14/23  Intake/Output from previous day: 07/25 0701 - 07/26 0700 In: 2130.5 [P.O.:360; I.V.:1607.3; IV Piggyback:163.2] Out: 1550 [Urine:1550] Intake/Output this shift: No intake/output data recorded.  General:  alert and oriented x 3< NAD Resp: breathing comfortably. Abd: soft, minimally distended, hypoactive BS Ext: warm, well perfused  Lab Results:  Recent Labs    04/17/23 0447 04/18/23 0441  WBC 12.1* 10.2  HGB 12.8* 11.9*  HCT 40.0 37.8*  PLT 240 210   BMET Recent Labs    04/17/23 0447 04/18/23 0441  NA 132* 134*  K 4.6 3.8  CL 101 104  CO2 22 21*  GLUCOSE 168* 72  BUN 41* 35*  CREATININE 1.92* 1.78*  CALCIUM 8.3* 7.9*   PT/INR No results for input(s): "LABPROT", "INR" in the last 72 hours. ABG Recent Labs    04/16/23 1315  HCO3 22.0    Studies/Results: CT ABDOMEN PELVIS WO CONTRAST  Result Date: 04/16/2023 CLINICAL DATA:  Abdominal distention.  Constipation EXAM: CT ABDOMEN AND PELVIS WITHOUT CONTRAST TECHNIQUE: Multidetector CT imaging of the abdomen and pelvis was performed following the standard protocol without IV contrast. RADIATION DOSE REDUCTION: This exam was performed according to the departmental dose-optimization program which includes automated exposure control, adjustment of the mA and/or kV according to patient size and/or use of iterative reconstruction technique. COMPARISON:  None Available. FINDINGS: Lower chest: There is some linear opacity lung bases likely scar or atelectasis. Breathing motion. Trace right-sided pleural fluid. Coronary artery calcifications are seen. Hepatobiliary: On  this non IV contrast exam, grossly the liver is preserved. Gallbladder is nondilated. Pancreas: Global atrophy of the pancreas.  No obvious mass. Spleen: Normal in size without focal abnormality. Adrenals/Urinary Tract: Adrenal glands are preserved. Mild bilateral renal atrophy. No abnormal calcification seen within either kidney nor along the course of either ureter. Preserved contours of the urinary bladder. Bladder wall slightly thickened and trabeculated with some small bladder diverticula. Stomach/Bowel: No oral contrast. Large bowel has scattered colonic stool with diverticula. Large bowel is nondilated. Stomach is distended with fluid and air. Small bowel has several loops which are dilated with air-fluid levels measuring up to 3.3 cm. Several diverticula seen. There is some fluid and stranding in the right hemi abdominal mesentery. There is also some areas of pneumatosis along the small bowel and scattered areas such as left hemiabdomen anteriorly on series 4, image 34 and right hemiabdomen on series 2, image 39. Possibilities would include areas of bowel ischemia. Please correlate clinical presentation. No free air. No clear portal venous gas at this time. Vascular/Lymphatic: Scattered vascular calcifications along the aorta and branch vessels. Normal caliber aorta and IVC. Reproductive: Enlarged prostate. Please correlate with the patient's history and PSA. Mass effect along the bladder. Other: Small fat containing right inguinal hernia.  Mild ascites. Musculoskeletal: Mild degenerative changes along the spine. Critical Value/emergent results were called by telephone at the time of interpretation on 04/16/2023 at 1:39 pm to provider [facility] DAVIS , who verbally acknowledged these results. IMPRESSION: Diffusely dilated small bowel without transition point. There is some areas of pneumatosis, mesenteric stranding and trace ascites. Please correlate  for etiology including evidence of bowel ischemia. A  postcontrast study may be of some benefit due to further delineate as clinically appropriate. Scattered small and large bowel diverticula. Tiny pleural effusion. Motion Enlarged prostate with mass effect along the bladder. Bladder wall is thickened and trabeculated with some small diverticula Electronically Signed   By: Karen Kays M.D.   On: 04/16/2023 16:40   DG Chest 2 View  Result Date: 04/16/2023 CLINICAL DATA:  Hyperglycemia. EXAM: CHEST - 2 VIEW COMPARISON:  None Available. FINDINGS: No consolidation, pneumothorax or effusion. Normal cardiopericardial silhouette without edema. Degenerative changes of the spine. Overlapping cardiac leads. Air-fluid level along the stomach beneath the left hemidiaphragm. IMPRESSION: Hyperinflation.  No acute cardiopulmonary disease. Electronically Signed   By: Karen Kays M.D.   On: 04/16/2023 13:52   DG Abd 1 View  Result Date: 04/16/2023 CLINICAL DATA:  Acute generalized abdominal pain. EXAM: ABDOMEN - 1 VIEW COMPARISON:  None Available. FINDINGS: Dilated small bowel loops are noted with air-fluid levels concerning for distal small bowel obstruction. Air-filled colon is noted as well. No definite free air is noted. IMPRESSION: Small bowel dilatation with air-fluid levels is noted concerning for distal small bowel obstruction. CT scan is recommended for further evaluation. Electronically Signed   By: Lupita Raider M.D.   On: 04/16/2023 12:37    Anti-infectives: Anti-infectives (From admission, onward)    Start     Dose/Rate Route Frequency Ordered Stop   04/17/23 0200  piperacillin-tazobactam (ZOSYN) IVPB 3.375 g  Status:  Discontinued        3.375 g 100 mL/hr over 30 Minutes Intravenous Every 8 hours 04/16/23 1833 04/16/23 2040   04/17/23 0200  piperacillin-tazobactam (ZOSYN) IVPB 3.375 g        3.375 g 12.5 mL/hr over 240 Minutes Intravenous Every 8 hours 04/16/23 2042     04/16/23 1715  piperacillin-tazobactam (ZOSYN) IVPB 3.375 g        3.375  g 100 mL/hr over 30 Minutes Intravenous  Once 04/16/23 1702 04/16/23 2039       Assessment/Plan: Pneumatosis intestinalis Dehydration Hyperglycemia Hyponatremia Hyperkalemia AKI  Abdominal exam continues to improve.  Considering the level of pneumatosis present, would advance slowly.  Clears this am, fulls for dinner.   Continue IV antibiotics.    If continues to do well, anticipate soft diet tomorrow and d/c antibiotics tomorrow.    LOS: 2 days    Almond Lint 04/18/2023

## 2023-04-18 NOTE — Progress Notes (Signed)
Progress Note   Patient: Jorge Butler NWG:[id number] DOB: May 24, 1932 DOA: 04/16/2023     2 DOS: the patient was seen and examined on 04/18/2023     Subjective:  Patient seen and examined at bedside this morning Admits to improvement in abdominal pain Nausea vomiting much better today Denies chest pain or urinary complaints Plan of care discussed with family present at bedside      Brief hospital course: Jorge Butler is a 89 y.o. male with medical history significant of type 2 diabetes, BPH, hyperlipidemia, hypothyroidism who was sent from urgent care to the emergency department due to hyperglycemia.  He initially presented to the urgent care center with complaints of not sleeping well for the past 2 nights, and some GI discomfort which made the patient think that he may have "caught a bug" while he was doing his shopping at the grocery store a couple of days ago.  He was subsequently referred to the emergency department due to hyperglycemia and AKI.  Patient now comes in with abdominal discomfort. CT scan of the abdomen obtained on admission showed findings of pneumatosis intestinalis and subsequently patient seen by general surgeon and given high risks of translocation in this condition antibiotics have been initiated.  Hospitalist service consulted to assist with patient's management       Assessment and Plan:  AKI (acute kidney injury) (HCC) Continue telemetry monitoring Monitor renal function closely Continue IV fluid Avoid nephrotoxic medications       Pneumatosis intestinalis  Surgeon on board and case discussed Patient initiated on full liquid diet Abdominal pain much better Monitor electrolytes closely Continue antibiotic therapy     Type 2 diabetes mellitus with hyperglycemia (HCC) Improving with IV fluids. The patient is currently n.p.o. Will continue IV fluids and monitor CBG. Continue to hold glipizide 5 mg p.o. daily until back on diet. Continue to  hold Sitagliptin due to renal function. Continue current insulin regimen     Hyperkalemia Continue repletion and monitoring     Hyperbilirubinemia In the setting of dehydration. Recheck once hydrated.     Pseudohyponatremia Continue supplemental IV fluids Monitor sodium level closely     Mild protein-calorie malnutrition (HCC) Protein supplementation once cleared for diet. Consider nutritional services evaluation.     Hypertension Parenteral antihypertensives as needed. Currently relatively hypotensive     Hypothyroidism Continue levothyroxine levothyroxine 88 mcg p.o. daily.     Hyperlipidemia Continue atorvastatin     Elevated PSA Continue tamsulosin Outpatient follow-up with urologist     Glaucoma Currently not on eyedrops. Follow-up with ophthalmologist as an outpatient   CODE STATUS: Full code   Family Communication: His godson was at bedside.       Physical Exam: General: Elderly male laying in bed in no acute distress HENT: Normocephalic atraumatic    Vascular: No JVD.  Cardiovascular: Normal S1 and S2  Pulmonary: Clear to auscultation bilaterally Abdominal:     General: Abdomen is protuberant.  Nontender Musculoskeletal:     Cervical back: Neck supple.     Right lower leg: No edema.     Left lower leg: No edema.  Skin:    General: Skin is warm and dry.  Neurological:     General: No focal deficit present.     Mental Status: He is alert and oriented to person, place, and time.  Psychiatric:        Mood and Affect: Mood normal.        Behavior: Behavior normal. Behavior is  cooperative.     Data reviewed: I have reviewed patient's labs, surgeon documentation, nursing documentation, vitals  Vitals:   04/17/23 1209 04/17/23 2046 04/18/23 0634 04/18/23 1253  BP: (!) 117/59 (!) 134/57 (!) 145/64 (!) 134/53  Pulse: 83 82 88 72  Resp: 16 18 19 20   Temp: 98.1 F (36.7 C) 98.2 F (36.8 C) 97.6 F (36.4 C) 98 F (36.7 C)  TempSrc: Oral Oral Oral  Oral  SpO2: 94% 100% 95% 100%  Weight:      Height:         Author: Loyce Dys, MD 04/18/2023 3:47 PM  For on call review [email or web address].

## 2023-04-18 NOTE — Progress Notes (Signed)
Mobility Specialist - Progress Note   04/18/23 0906  Mobility  Activity Ambulated independently in hallway  Level of Assistance Contact guard assist, steadying assist  Assistive Device None  Distance Ambulated (ft) 500 ft  Range of Motion/Exercises Active  Activity Response Tolerated well  Mobility Referral Yes  $Mobility charge 1 Mobility  Mobility Specialist Start Time (ACUTE ONLY) 0845  Mobility Specialist Stop Time (ACUTE ONLY) 0906  Mobility Specialist Time Calculation (min) (ACUTE ONLY) 21 min   Pt was found in bed and agreeable to ambulate. Took ~32min to gain his balance when going from STS. Afterwards ambulated in hallway with no complaints. Returned to use bathroom and at EOS was left sitting EOB. Call bell in reach and bed alarm on.  Billey Chang Mobility Specialist

## 2023-04-19 DIAGNOSIS — N179 Acute kidney failure, unspecified: Secondary | ICD-10-CM | POA: Diagnosis not present

## 2023-04-19 LAB — GLUCOSE, CAPILLARY: Glucose-Capillary: 89 mg/dL (ref 70–99)

## 2023-04-19 MED ORDER — ONDANSETRON HCL 4 MG PO TABS: 4.0000 mg | ORAL_TABLET | Freq: Four times a day (QID) | ORAL | 0 refills | Status: DC | PRN

## 2023-04-19 MED ORDER — CIPROFLOXACIN HCL 250 MG PO TABS: 250.0000 mg | ORAL_TABLET | Freq: Two times a day (BID) | ORAL | 0 refills | Status: AC

## 2023-04-19 MED ORDER — METRONIDAZOLE 500 MG PO TABS: 500.0000 mg | ORAL_TABLET | Freq: Three times a day (TID) | ORAL | 0 refills | Status: AC

## 2023-04-19 MED ORDER — POLYETHYLENE GLYCOL 3350 17 G PO PACK: 17.0000 g | PACK | Freq: Every day | ORAL | 0 refills | Status: AC

## 2023-04-19 NOTE — Progress Notes (Signed)
   Subjective/Chief Complaint: No complaints. Tolerated clears yesterday   Objective: Vital signs in last 24 hours: Temp:  [97.8 F (36.6 C)-98.2 F (36.8 C)] 98.2 F (36.8 C) (07/27 0455) Pulse Rate:  [72-79] 76 (07/27 0455) Resp:  [20] 20 (07/27 0455) BP: (133-140)/(53-72) 133/72 (07/27 0455) SpO2:  [99 %-100 %] 99 % (07/27 0455) Last BM Date : 04/18/23  Intake/Output from previous day: 07/26 0701 - 07/27 0700 In: 1636.9 [P.O.:720; I.V.:790.9; IV Piggyback:126.1] Out: 800 [Urine:800] Intake/Output this shift: No intake/output data recorded.  General appearance: alert and cooperative Resp: clear to auscultation bilaterally Cardio: regular rate and rhythm GI: soft, nontender  Lab Results:  Recent Labs    04/18/23 0441 04/19/23 0632  WBC 10.2 8.0  HGB 11.9* 12.1*  HCT 37.8* 37.8*  PLT 210 247   BMET Recent Labs    04/17/23 0447 04/18/23 0441  NA 132* 134*  K 4.6 3.8  CL 101 104  CO2 22 21*  GLUCOSE 168* 72  BUN 41* 35*  CREATININE 1.92* 1.78*  CALCIUM 8.3* 7.9*   PT/INR No results for input(s): "LABPROT", "INR" in the last 72 hours. ABG Recent Labs    04/16/23 1315  HCO3 22.0    Studies/Results: No results found.  Anti-infectives: Anti-infectives (From admission, onward)    Start     Dose/Rate Route Frequency Ordered Stop   04/17/23 0200  piperacillin-tazobactam (ZOSYN) IVPB 3.375 g  Status:  Discontinued        3.375 g 100 mL/hr over 30 Minutes Intravenous Every 8 hours 04/16/23 1833 04/16/23 2040   04/17/23 0200  piperacillin-tazobactam (ZOSYN) IVPB 3.375 g        3.375 g 12.5 mL/hr over 240 Minutes Intravenous Every 8 hours 04/16/23 2042     04/16/23 1715  piperacillin-tazobactam (ZOSYN) IVPB 3.375 g        3.375 g 100 mL/hr over 30 Minutes Intravenous  Once 04/16/23 1702 04/16/23 2039       Assessment/Plan: s/p * No surgery found * Advance diet Wbc normal Ambulate Will follow  LOS: 3 days    Jorge Butler 04/19/2023

## 2023-04-19 NOTE — Plan of Care (Signed)

## 2023-04-19 NOTE — Progress Notes (Signed)
Mobility Specialist - Progress Note   04/19/23 1038  Mobility  Activity Ambulated with assistance in hallway  Level of Assistance Modified independent, requires aide device or extra time  Assistive Device None  Distance Ambulated (ft) 500 ft  Range of Motion/Exercises Active  Activity Response Tolerated well  Mobility Referral Yes  $Mobility charge 1 Mobility  Mobility Specialist Start Time (ACUTE ONLY) 1015  Mobility Specialist Stop Time (ACUTE ONLY) 1025  Mobility Specialist Time Calculation (min) (ACUTE ONLY) 10 min   Pt received in bed and agreed to mobility. Had no issues throughout session, pt was IND, only assisted in gown and pushing IV.   Returned to chair with all needs met, alarm on.  Marilynne Halsted Mobility Specialist

## 2023-04-19 NOTE — Discharge Summary (Signed)
Physician Discharge Summary   Patient: Jorge Butler MRN: [id number] DOB: 07-30-1932  Admit date:     04/16/2023  Discharge date: 04/19/23  Discharge Physician: Loyce Dys   PCP: Creola Corn, MD     Discharge Diagnoses: AKI (acute kidney injury) [facility]) on CKD stage IIIb   Pneumatosis intestinalis    Type 2 diabetes mellitus with hyperglycemia (HCC)   Hyperkalemia   Hyperbilirubinemia   Pseudohyponatremia   Mild protein-calorie malnutrition (HCC)   Hypertension   Hypothyroidism   Hyperlipidemia   Elevated PSA   Glaucoma   Hospital Course: Jorge Butler is a 89 y.o. male with medical history significant of type 2 diabetes, BPH, hyperlipidemia, hypothyroidism who was sent from urgent care to the emergency department due to hyperglycemia.  He initially presented to the urgent care center with complaints of not sleeping well for the past 2 nights, and some GI discomfort which made the patient think that he may have "caught a bug" while he was doing his shopping at the grocery store a couple of days ago.  He was subsequently referred to the emergency department due to hyperglycemia and AKI.  Patient now comes in with abdominal discomfort. CT scan of the abdomen obtained on admission showed findings of pneumatosis intestinalis and subsequently patient seen by general surgeon and given high risks of translocation in this condition antibiotics have been initiated.  Patient received a course of IV antibiotics with resolution of abdominal complaints patient now able to tolerate diet and walking about with no issues and have therefore been cleared to follow-up with primary care physician as well as complete a course of oral antibiotics.  Consultants: General surgery Procedures performed: None Disposition: Home Diet recommendation:  Cardiac diet DISCHARGE MEDICATION: Allergies as of 04/19/2023       Reactions   Sulfa Antibiotics Nausea And Vomiting    Sulfamethoxazole-trimethoprim Nausea And Vomiting        Medication List     STOP taking these medications    neomycin-bacitracin-polymyxin [id number] ointment Commonly known as: NEOSPORIN       TAKE these medications    aspirin EC 81 MG tablet Take 81 mg by mouth once.   atorvastatin 20 MG tablet Commonly known as: LIPITOR Take 20 mg by mouth every evening.   ciprofloxacin 250 MG tablet Commonly known as: CIPRO Take 1 tablet (250 mg total) by mouth 2 (two) times daily for 3 days.   finasteride 5 MG tablet Commonly known as: PROSCAR Take 5 mg by mouth daily.   glipiZIDE 5 MG tablet Commonly known as: GLUCOTROL Take 5 mg by mouth 2 (two) times daily before a meal.   metroNIDAZOLE 500 MG tablet Commonly known as: Flagyl Take 1 tablet (500 mg total) by mouth 3 (three) times daily for 3 days.   ondansetron 4 MG tablet Commonly known as: ZOFRAN Take 1 tablet (4 mg total) by mouth every 6 (six) hours as needed for nausea.   pioglitazone 45 MG tablet Commonly known as: ACTOS Take 45 mg by mouth daily.   polyethylene glycol 17 g packet Commonly known as: MIRALAX / GLYCOLAX Take 17 g by mouth daily. Start taking on: April 20, 2023   SITagliptin 25 MG Tabs Take 1 tablet by mouth daily.   Synthroid 88 MCG tablet Generic drug: levothyroxine Take 88 mcg by mouth daily before breakfast.   tamsulosin 0.4 MG Caps capsule Commonly known as: FLOMAX Take 0.4 mg by mouth daily.  Discharge Exam: Filed Weights   04/16/23 1233  Weight: 77 kg   General: Elderly male laying in bed in no acute distress HENT: Normocephalic atraumatic    Vascular: No JVD.  Cardiovascular: Normal S1 and S2  Pulmonary: Clear to auscultation bilaterally Abdominal:     General: Abdomen is protuberant.  Nontender Musculoskeletal:     Cervical back: Neck supple.     Right lower leg: No edema.     Left lower leg: No edema.  Skin:    General: Skin is warm and dry.   Neurological:     General: No focal deficit present.     Mental Status: He is alert and oriented to person, place, and time.  Psychiatric:        Mood and Affect: Mood normal.        Behavior: Behavior normal. Behavior is cooperative.   Condition at discharge: good     Discharge time spent:  35 minutes.  Signed: Loyce Dys, MD [facility] 04/19/2023

## 2023-04-22 DIAGNOSIS — E291 Testicular hypofunction: Secondary | ICD-10-CM | POA: Diagnosis not present

## 2023-04-25 DIAGNOSIS — K6389 Other specified diseases of intestine: Secondary | ICD-10-CM | POA: Diagnosis not present

## 2023-04-25 DIAGNOSIS — N184 Chronic kidney disease, stage 4 (severe): Secondary | ICD-10-CM | POA: Diagnosis not present

## 2023-05-08 DIAGNOSIS — R3914 Feeling of incomplete bladder emptying: Secondary | ICD-10-CM | POA: Diagnosis not present

## 2023-05-08 DIAGNOSIS — N401 Enlarged prostate with lower urinary tract symptoms: Secondary | ICD-10-CM | POA: Diagnosis not present

## 2023-05-08 DIAGNOSIS — N183 Chronic kidney disease, stage 3 unspecified: Secondary | ICD-10-CM | POA: Diagnosis not present

## 2023-05-13 DIAGNOSIS — E119 Type 2 diabetes mellitus without complications: Secondary | ICD-10-CM | POA: Diagnosis not present

## 2023-05-13 DIAGNOSIS — H353122 Nonexudative age-related macular degeneration, left eye, intermediate dry stage: Secondary | ICD-10-CM | POA: Diagnosis not present

## 2023-05-13 DIAGNOSIS — Z961 Presence of intraocular lens: Secondary | ICD-10-CM | POA: Diagnosis not present

## 2023-05-13 DIAGNOSIS — H353211 Exudative age-related macular degeneration, right eye, with active choroidal neovascularization: Secondary | ICD-10-CM | POA: Diagnosis not present

## 2023-05-14 DIAGNOSIS — E291 Testicular hypofunction: Secondary | ICD-10-CM | POA: Diagnosis not present

## 2023-06-03 DIAGNOSIS — E230 Hypopituitarism: Secondary | ICD-10-CM | POA: Diagnosis not present

## 2023-06-24 DIAGNOSIS — E291 Testicular hypofunction: Secondary | ICD-10-CM | POA: Diagnosis not present

## 2023-07-10 DIAGNOSIS — H353123 Nonexudative age-related macular degeneration, left eye, advanced atrophic without subfoveal involvement: Secondary | ICD-10-CM | POA: Diagnosis not present

## 2023-07-10 DIAGNOSIS — H43813 Vitreous degeneration, bilateral: Secondary | ICD-10-CM | POA: Diagnosis not present

## 2023-07-10 DIAGNOSIS — E113293 Type 2 diabetes mellitus with mild nonproliferative diabetic retinopathy without macular edema, bilateral: Secondary | ICD-10-CM | POA: Diagnosis not present

## 2023-07-10 DIAGNOSIS — H353211 Exudative age-related macular degeneration, right eye, with active choroidal neovascularization: Secondary | ICD-10-CM | POA: Diagnosis not present

## 2023-07-10 DIAGNOSIS — H43393 Other vitreous opacities, bilateral: Secondary | ICD-10-CM | POA: Diagnosis not present

## 2023-07-15 DIAGNOSIS — E230 Hypopituitarism: Secondary | ICD-10-CM | POA: Diagnosis not present

## 2023-07-31 DIAGNOSIS — H353123 Nonexudative age-related macular degeneration, left eye, advanced atrophic without subfoveal involvement: Secondary | ICD-10-CM | POA: Diagnosis not present

## 2023-08-05 DIAGNOSIS — E291 Testicular hypofunction: Secondary | ICD-10-CM | POA: Diagnosis not present

## 2023-08-26 DIAGNOSIS — E291 Testicular hypofunction: Secondary | ICD-10-CM | POA: Diagnosis not present

## 2023-09-11 DIAGNOSIS — H353123 Nonexudative age-related macular degeneration, left eye, advanced atrophic without subfoveal involvement: Secondary | ICD-10-CM | POA: Diagnosis not present

## 2023-09-23 DIAGNOSIS — E291 Testicular hypofunction: Secondary | ICD-10-CM | POA: Diagnosis not present

## 2023-09-26 DIAGNOSIS — R3915 Urgency of urination: Secondary | ICD-10-CM | POA: Diagnosis not present

## 2023-10-14 DIAGNOSIS — N184 Chronic kidney disease, stage 4 (severe): Secondary | ICD-10-CM | POA: Diagnosis not present

## 2023-10-14 DIAGNOSIS — E039 Hypothyroidism, unspecified: Secondary | ICD-10-CM | POA: Diagnosis not present

## 2023-10-14 DIAGNOSIS — E1122 Type 2 diabetes mellitus with diabetic chronic kidney disease: Secondary | ICD-10-CM | POA: Diagnosis not present

## 2023-10-14 DIAGNOSIS — E291 Testicular hypofunction: Secondary | ICD-10-CM | POA: Diagnosis not present

## 2023-11-04 DIAGNOSIS — E291 Testicular hypofunction: Secondary | ICD-10-CM | POA: Diagnosis not present

## 2023-11-25 DIAGNOSIS — E291 Testicular hypofunction: Secondary | ICD-10-CM | POA: Diagnosis not present

## 2023-12-10 DIAGNOSIS — H353211 Exudative age-related macular degeneration, right eye, with active choroidal neovascularization: Secondary | ICD-10-CM | POA: Diagnosis not present

## 2023-12-10 DIAGNOSIS — H353123 Nonexudative age-related macular degeneration, left eye, advanced atrophic without subfoveal involvement: Secondary | ICD-10-CM | POA: Diagnosis not present

## 2023-12-10 DIAGNOSIS — H43393 Other vitreous opacities, bilateral: Secondary | ICD-10-CM | POA: Diagnosis not present

## 2023-12-10 DIAGNOSIS — E113293 Type 2 diabetes mellitus with mild nonproliferative diabetic retinopathy without macular edema, bilateral: Secondary | ICD-10-CM | POA: Diagnosis not present

## 2023-12-10 DIAGNOSIS — H43813 Vitreous degeneration, bilateral: Secondary | ICD-10-CM | POA: Diagnosis not present

## 2023-12-16 DIAGNOSIS — E291 Testicular hypofunction: Secondary | ICD-10-CM | POA: Diagnosis not present

## 2023-12-25 DIAGNOSIS — H353123 Nonexudative age-related macular degeneration, left eye, advanced atrophic without subfoveal involvement: Secondary | ICD-10-CM | POA: Diagnosis not present

## 2024-01-06 DIAGNOSIS — E291 Testicular hypofunction: Secondary | ICD-10-CM | POA: Diagnosis not present

## 2024-01-27 ENCOUNTER — Other Ambulatory Visit: Payer: Self-pay

## 2024-01-27 ENCOUNTER — Inpatient Hospital Stay (HOSPITAL_COMMUNITY)
Admission: EM | Admit: 2024-01-27 | Discharge: 2024-01-30 | DRG: 374 | Disposition: A | Discharge status: Home / Self Care | Attending: Internal Medicine | Admitting: Internal Medicine

## 2024-01-27 ENCOUNTER — Emergency Department (HOSPITAL_COMMUNITY)

## 2024-01-27 ENCOUNTER — Encounter (HOSPITAL_COMMUNITY): Payer: Self-pay

## 2024-01-27 DIAGNOSIS — Z825 Family history of asthma and other chronic lower respiratory diseases: Secondary | ICD-10-CM

## 2024-01-27 DIAGNOSIS — N1832 Chronic kidney disease, stage 3b: Secondary | ICD-10-CM | POA: Diagnosis not present

## 2024-01-27 DIAGNOSIS — N179 Acute kidney failure, unspecified: Secondary | ICD-10-CM | POA: Diagnosis not present

## 2024-01-27 DIAGNOSIS — N401 Enlarged prostate with lower urinary tract symptoms: Secondary | ICD-10-CM | POA: Diagnosis present

## 2024-01-27 DIAGNOSIS — Z66 Do not resuscitate: Secondary | ICD-10-CM | POA: Diagnosis not present

## 2024-01-27 DIAGNOSIS — R339 Retention of urine, unspecified: Principal | ICD-10-CM

## 2024-01-27 DIAGNOSIS — Z800 Family history of malignant neoplasm of digestive organs: Secondary | ICD-10-CM

## 2024-01-27 DIAGNOSIS — Z79899 Other long term (current) drug therapy: Secondary | ICD-10-CM | POA: Diagnosis not present

## 2024-01-27 DIAGNOSIS — E860 Dehydration: Secondary | ICD-10-CM | POA: Diagnosis not present

## 2024-01-27 DIAGNOSIS — N134 Hydroureter: Secondary | ICD-10-CM | POA: Diagnosis not present

## 2024-01-27 DIAGNOSIS — E039 Hypothyroidism, unspecified: Secondary | ICD-10-CM | POA: Diagnosis not present

## 2024-01-27 DIAGNOSIS — K3580 Unspecified acute appendicitis: Secondary | ICD-10-CM | POA: Diagnosis not present

## 2024-01-27 DIAGNOSIS — E1165 Type 2 diabetes mellitus with hyperglycemia: Secondary | ICD-10-CM | POA: Diagnosis not present

## 2024-01-27 DIAGNOSIS — E785 Hyperlipidemia, unspecified: Secondary | ICD-10-CM | POA: Diagnosis present

## 2024-01-27 DIAGNOSIS — Z7984 Long term (current) use of oral hypoglycemic drugs: Secondary | ICD-10-CM | POA: Diagnosis not present

## 2024-01-27 DIAGNOSIS — R9431 Abnormal electrocardiogram [ECG] [EKG]: Secondary | ICD-10-CM | POA: Diagnosis not present

## 2024-01-27 DIAGNOSIS — Z882 Allergy status to sulfonamides status: Secondary | ICD-10-CM

## 2024-01-27 DIAGNOSIS — Z79890 Hormone replacement therapy (postmenopausal): Secondary | ICD-10-CM | POA: Diagnosis not present

## 2024-01-27 DIAGNOSIS — E119 Type 2 diabetes mellitus without complications: Secondary | ICD-10-CM | POA: Diagnosis not present

## 2024-01-27 DIAGNOSIS — R5383 Other fatigue: Secondary | ICD-10-CM | POA: Diagnosis not present

## 2024-01-27 DIAGNOSIS — Z8249 Family history of ischemic heart disease and other diseases of the circulatory system: Secondary | ICD-10-CM

## 2024-01-27 DIAGNOSIS — R109 Unspecified abdominal pain: Secondary | ICD-10-CM | POA: Diagnosis present

## 2024-01-27 DIAGNOSIS — Z1152 Encounter for screening for COVID-19: Secondary | ICD-10-CM

## 2024-01-27 DIAGNOSIS — A419 Sepsis, unspecified organism: Secondary | ICD-10-CM | POA: Diagnosis not present

## 2024-01-27 DIAGNOSIS — E7800 Pure hypercholesterolemia, unspecified: Secondary | ICD-10-CM | POA: Diagnosis not present

## 2024-01-27 DIAGNOSIS — E782 Mixed hyperlipidemia: Secondary | ICD-10-CM

## 2024-01-27 DIAGNOSIS — E1122 Type 2 diabetes mellitus with diabetic chronic kidney disease: Secondary | ICD-10-CM | POA: Diagnosis not present

## 2024-01-27 DIAGNOSIS — Z811 Family history of alcohol abuse and dependence: Secondary | ICD-10-CM

## 2024-01-27 DIAGNOSIS — K3589 Other acute appendicitis without perforation or gangrene: Secondary | ICD-10-CM

## 2024-01-27 DIAGNOSIS — C189 Malignant neoplasm of colon, unspecified: Principal | ICD-10-CM | POA: Diagnosis present

## 2024-01-27 DIAGNOSIS — K3532 Acute appendicitis with perforation and localized peritonitis, without abscess: Secondary | ICD-10-CM | POA: Diagnosis present

## 2024-01-27 DIAGNOSIS — R933 Abnormal findings on diagnostic imaging of other parts of digestive tract: Secondary | ICD-10-CM | POA: Diagnosis not present

## 2024-01-27 DIAGNOSIS — R1031 Right lower quadrant pain: Secondary | ICD-10-CM

## 2024-01-27 DIAGNOSIS — K8020 Calculus of gallbladder without cholecystitis without obstruction: Secondary | ICD-10-CM | POA: Diagnosis not present

## 2024-01-27 DIAGNOSIS — R338 Other retention of urine: Secondary | ICD-10-CM | POA: Diagnosis present

## 2024-01-27 LAB — URINALYSIS, ROUTINE W REFLEX MICROSCOPIC
Bacteria, UA: NONE SEEN
Bilirubin Urine: NEGATIVE
Glucose, UA: >=500 mg/dL — AB
Hgb urine dipstick: NEGATIVE
Ketones, ur: NEGATIVE mg/dL
Leukocytes,Ua: NEGATIVE
Nitrite: NEGATIVE
Protein, ur: 100 mg/dL — AB
Specific Gravity, Urine: 1.013 (ref 1.005–1.030)
pH: 6 (ref 5.0–8.0)

## 2024-01-27 LAB — COMPREHENSIVE METABOLIC PANEL WITH GFR
ALT: 24 U/L (ref 0–44)
AST: 20 U/L (ref 15–41)
Albumin: 3.1 g/dL — ABNORMAL LOW (ref 3.5–5.0)
Alkaline Phosphatase: 81 U/L (ref 38–126)
Anion gap: 12 (ref 5–15)
BUN: 49 mg/dL — ABNORMAL HIGH (ref 8–23)
CO2: 24 mmol/L (ref 22–32)
Calcium: 9.2 mg/dL (ref 8.9–10.3)
Chloride: 96 mmol/L — ABNORMAL LOW (ref 98–111)
Creatinine, Ser: 2.18 mg/dL — ABNORMAL HIGH (ref 0.61–1.24)
GFR, Estimated: 28 mL/min — ABNORMAL LOW (ref >60)
Glucose, Bld: 260 mg/dL — ABNORMAL HIGH (ref 70–99)
Potassium: 4.6 mmol/L (ref 3.5–5.1)
Sodium: 132 mmol/L — ABNORMAL LOW (ref 135–145)
Total Bilirubin: 0.8 mg/dL (ref 0.0–1.2)
Total Protein: 7.2 g/dL (ref 6.5–8.1)

## 2024-01-27 LAB — DIFFERENTIAL
Abs Immature Granulocytes: 0.15 10*3/uL — ABNORMAL HIGH (ref 0.00–0.07)
Basophils Absolute: 0.1 10*3/uL (ref 0.0–0.1)
Basophils Relative: 0 %
Eosinophils Absolute: 0 10*3/uL (ref 0.0–0.5)
Eosinophils Relative: 0 %
Immature Granulocytes: 1 %
Lymphocytes Relative: 7 %
Lymphs Abs: 1.5 10*3/uL (ref 0.7–4.0)
Monocytes Absolute: 1.7 10*3/uL — ABNORMAL HIGH (ref 0.1–1.0)
Monocytes Relative: 8 %
Neutro Abs: 18.5 10*3/uL — ABNORMAL HIGH (ref 1.7–7.7)
Neutrophils Relative %: 84 %

## 2024-01-27 LAB — CBC
HCT: 37.4 % — ABNORMAL LOW (ref 39.0–52.0)
Hemoglobin: 12.2 g/dL — ABNORMAL LOW (ref 13.0–17.0)
MCH: 30 pg (ref 26.0–34.0)
MCHC: 32.6 g/dL (ref 30.0–36.0)
MCV: 91.9 fL (ref 80.0–100.0)
Platelets: 309 10*3/uL (ref 150–400)
RBC: 4.07 MIL/uL — ABNORMAL LOW (ref 4.22–5.81)
RDW: 13.9 % (ref 11.5–15.5)
WBC: 22.5 10*3/uL — ABNORMAL HIGH (ref 4.0–10.5)
nRBC: 0 % (ref 0.0–0.2)

## 2024-01-27 LAB — RESP PANEL BY RT-PCR (RSV, FLU A&B, COVID)  RVPGX2
Influenza A by PCR: NEGATIVE
Influenza B by PCR: NEGATIVE
Resp Syncytial Virus by PCR: NEGATIVE
SARS Coronavirus 2 by RT PCR: NEGATIVE

## 2024-01-27 LAB — CBG MONITORING, ED: Glucose-Capillary: 263 mg/dL — ABNORMAL HIGH (ref 70–99)

## 2024-01-27 LAB — TSH: TSH: 0.834 u[IU]/mL (ref 0.350–4.500)

## 2024-01-27 LAB — I-STAT CG4 LACTIC ACID, ED
Lactic Acid, Venous: 1 mmol/L (ref 0.5–1.9)
Lactic Acid, Venous: 0.7 mmol/L (ref 0.5–1.9)

## 2024-01-27 LAB — TROPONIN I (HIGH SENSITIVITY)
Troponin I (High Sensitivity): 9 ng/L (ref <18)
Troponin I (High Sensitivity): 8 ng/L (ref <18)

## 2024-01-27 MED ORDER — FENTANYL CITRATE PF 50 MCG/ML IJ SOSY: 12.5000 ug | PREFILLED_SYRINGE | INTRAMUSCULAR | Status: DC | PRN

## 2024-01-27 MED ORDER — INSULIN ASPART 100 UNIT/ML IJ SOLN
0.0000 [IU] | Freq: Four times a day (QID) | INTRAMUSCULAR | Status: DC
  Administered 2024-01-28: 2 [IU] via SUBCUTANEOUS
  Administered 2024-01-28: 1 [IU] via SUBCUTANEOUS
  Administered 2024-01-29 – 2024-01-30 (×3): 3 [IU] via SUBCUTANEOUS
  Administered 2024-01-30: 4 [IU] via SUBCUTANEOUS
  Filled 2024-01-27: qty 0.06

## 2024-01-27 MED ORDER — ONDANSETRON HCL 4 MG/2ML IJ SOLN: 4.0000 mg | Freq: Four times a day (QID) | INTRAMUSCULAR | Status: DC | PRN

## 2024-01-27 MED ORDER — MELATONIN 3 MG PO TABS: 3.0000 mg | ORAL_TABLET | Freq: Every evening | ORAL | Status: DC | PRN

## 2024-01-27 MED ORDER — ACETAMINOPHEN 650 MG RE SUPP: 650.0000 mg | Freq: Four times a day (QID) | RECTAL | Status: DC | PRN

## 2024-01-27 MED ORDER — SODIUM CHLORIDE 0.9 % IV BOLUS: 1000.0000 mL | Freq: Once | INTRAVENOUS | Status: DC

## 2024-01-27 MED ORDER — ACETAMINOPHEN 325 MG PO TABS: 650.0000 mg | ORAL_TABLET | Freq: Four times a day (QID) | ORAL | Status: DC | PRN

## 2024-01-27 MED ORDER — LACTATED RINGERS IV SOLN: INTRAVENOUS | Status: AC

## 2024-01-27 MED ORDER — PIPERACILLIN-TAZOBACTAM 3.375 G IVPB 30 MIN: 3.3750 g | Freq: Once | INTRAVENOUS | Status: DC

## 2024-01-27 MED ORDER — DEXTROSE 50 % IV SOLN: 1.0000 | INTRAVENOUS | Status: DC | PRN

## 2024-01-27 MED ORDER — PIPERACILLIN-TAZOBACTAM 3.375 G IVPB
3.3750 g | Freq: Three times a day (TID) | INTRAVENOUS | Status: DC
  Administered 2024-01-28 – 2024-01-30 (×9): 3.375 g via INTRAVENOUS
  Filled 2024-01-27 (×9): qty 50

## 2024-01-27 MED ORDER — NALOXONE HCL 0.4 MG/ML IJ SOLN: 0.4000 mg | INTRAMUSCULAR | Status: DC | PRN

## 2024-01-27 NOTE — ED Triage Notes (Signed)
 Pt states he has not been feeling well for the last few days, decreased energy, no appetite. No pain

## 2024-01-27 NOTE — ED Notes (Signed)
 Patient transported to CT

## 2024-01-27 NOTE — ED Provider Notes (Signed)
 [place] EMERGENCY DEPARTMENT AT [facility] Provider Note   CSN: [id number] Arrival date & time: 01/27/24  1530     History  Chief Complaint  Patient presents with   Fatigue    Jorge Butler is a 89 y.o. male with history of pneumatosis, type 2 diabetes, hyperlipidemia, hypothyroidism, BPH who presents to the ED with feeling " off and rundown".  Denies significant cough, congestion.  No chest pain or shortness of breath.  No abdominal pain, vomiting diarrhea.  Although he does endorse decreased appetite.  Denies any fevers or chills.  Dysuria or increased frequency.  Denies any prior abdominal surgeries.  HPI    Past Medical History:  Diagnosis Date   Diabetes mellitus without complication (HCC)    Enlarged prostate    High cholesterol    Thyroid  disease    Past Surgical History:  Procedure Laterality Date   pituitory        Home Medications Prior to Admission medications   Medication Sig Start Date End Date Taking? Authorizing Provider  aspirin  EC 81 MG tablet Take 81 mg by mouth once. 08/26/13  Yes [provider]  atorvastatin  (LIPITOR) 20 MG tablet Take 20 mg by mouth every evening. 12/04/22  Yes [provider]  finasteride  (PROSCAR ) 5 MG tablet Take 5 mg by mouth daily. 08/02/09  Yes [provider]  glipiZIDE  (GLUCOTROL ) 5 MG tablet Take 5 mg by mouth 2 (two) times daily before a meal. 12/04/22  Yes [provider]  levothyroxine  (SYNTHROID ) 88 MCG tablet Take 88 mcg by mouth daily before breakfast. 12/04/22  Yes [provider]  pioglitazone (ACTOS) 45 MG tablet Take 45 mg by mouth daily. 06/04/22  Yes [provider]  SITagliptin 25 MG TABS Take 1 tablet by mouth daily.   Yes [provider]  tamsulosin  (FLOMAX ) 0.4 MG CAPS capsule Take 0.4 mg by mouth daily. 03/22/20  Yes [provider]  ondansetron  (ZOFRAN ) 4 MG tablet Take 1 tablet (4 mg total) by mouth every 6 (six) hours as  needed for nausea. Patient not taking: Reported on 01/27/2024 04/19/23   Ezzard Holms, MD  polyethylene glycol (MIRALAX  / GLYCOLAX ) 17 g packet Take 17 g by mouth daily. Patient not taking: Reported on 01/27/2024 04/20/23   Ezzard Holms, MD      Allergies    Sulfa antibiotics and Sulfamethoxazole-trimethoprim    Review of Systems   Review of Systems  Constitutional:  Positive for fatigue.    Physical Exam Updated Vital Signs BP 134/60 (BP Location: Left Arm)   Pulse 78   Temp 99 F (37.2 C) (Oral)   Resp 16   Ht 5\' 10"  (1.778 m)   Wt 77.1 kg   SpO2 100%   BMI 24.39 kg/m  Physical Exam Vitals and nursing note reviewed.  Constitutional:      General: He is not in acute distress.    Appearance: He is well-developed.  HENT:     Head: Normocephalic and atraumatic.  Eyes:     Conjunctiva/sclera: Conjunctivae normal.  Cardiovascular:     Rate and Rhythm: Normal rate and regular rhythm.     Heart sounds: No murmur heard. Pulmonary:     Effort: Pulmonary effort is normal. No respiratory distress.     Breath sounds: Normal breath sounds.  Abdominal:     Comments: There is some mild right flank tenderness distention without obvious hernia, no tenderness to McBurney's point, negative Murphy sign, no peritoneal  signs  Musculoskeletal:        General: No swelling.     Cervical back: Neck supple.  Skin:    General: Skin is warm and dry.     Capillary Refill: Capillary refill takes less than 2 seconds.  Neurological:     Mental Status: He is alert.  Psychiatric:        Mood and Affect: Mood normal.     ED Results / Procedures / Treatments   Labs (all labs ordered are listed, but only abnormal results are displayed) Labs Reviewed  COMPREHENSIVE METABOLIC PANEL WITH GFR - Abnormal; Notable for the following components:      Result Value   Sodium 132 (*)    Chloride 96 (*)    Glucose, Bld 260 (*)    BUN 49 (*)    Creatinine, Ser 2.18 (*)    Albumin 3.1 (*)    GFR,  Estimated 28 (*)    All other components within normal limits  CBC - Abnormal; Notable for the following components:   WBC 22.5 (*)    RBC 4.07 (*)    Hemoglobin 12.2 (*)    HCT 37.4 (*)    All other components within normal limits  URINALYSIS, ROUTINE W REFLEX MICROSCOPIC - Abnormal; Notable for the following components:   Glucose, UA >=500 (*)    Protein, ur 100 (*)    All other components within normal limits  DIFFERENTIAL - Abnormal; Notable for the following components:   Neutro Abs 18.5 (*)    Monocytes Absolute 1.7 (*)    Abs Immature Granulocytes 0.15 (*)    All other components within normal limits  CBG MONITORING, ED - Abnormal; Notable for the following components:   Glucose-Capillary 263 (*)    All other components within normal limits  RESP PANEL BY RT-PCR (RSV, FLU A&B, COVID)  [id number]  CULTURE, BLOOD (ROUTINE X 2)  CULTURE, BLOOD (ROUTINE X 2)  TSH  CBC WITH DIFFERENTIAL/PLATELET  COMPREHENSIVE METABOLIC PANEL WITH GFR  MAGNESIUM  MAGNESIUM  PROTIME-INR  HEMOGLOBIN A1C  CBG MONITORING, ED  I-STAT CG4 LACTIC ACID, ED  I-STAT CG4 LACTIC ACID, ED  TROPONIN I (HIGH SENSITIVITY)  TROPONIN I (HIGH SENSITIVITY)    EKG EKG Interpretation Date/Time:  Tuesday Jan 27 2024 15:40:03 EDT Ventricular Rate:  92 PR Interval:  184 QRS Duration:  89 QT Interval:  336 QTC Calculation: 416 R Axis:   100  Text Interpretation: Sinus rhythm Consider left atrial enlargement Right axis deviation Baseline wander in lead(s) III aVL Confirmed by Hiawatha Lout ([id number]) on 01/27/2024 4:32:24 PM  Radiology CT ABDOMEN PELVIS WO CONTRAST Result Date: 01/27/2024 CLINICAL DATA:  History of pneumatosis with abdominal distension and right flank pain. EXAM: CT ABDOMEN AND PELVIS WITHOUT CONTRAST TECHNIQUE: Multidetector CT imaging of the abdomen and pelvis was performed following the standard protocol without IV contrast. RADIATION DOSE REDUCTION: This exam was performed according to the  departmental dose-optimization program which includes automated exposure control, adjustment of the mA and/or kV according to patient size and/or use of iterative reconstruction technique. COMPARISON:  04/16/2023 FINDINGS: Mild motion degradation. EKG wires and leads cause artifact. Lack of oral or IV contrast. Lower chest: Bibasilar scarring. Heart size accentuated by a pectus excavatum deformity. Right coronary artery calcification. Hepatobiliary: Normal noncontrast appearance of the liver. Dependent gallstone without acute cholecystitis or biliary duct dilatation. Pancreas: Normal, without mass or ductal dilatation. Spleen: Normal in size, without focal abnormality. Adrenals/Urinary Tract: Normal adrenal glands. Renal cortical  thinning is mild, within normal variation for age. Minimal left-sided caliectasis. Minimal bilateral ureteric prominence, likely due to bladder distension, which is moderate. Stomach/BOwel: Normal stomach, without wall thickening. Descending duodenal diverticulum or diverticula. Otherwise normal small bowel. Extensive colonic diverticulosis. Normal terminal ileum. About the cecum is inflammation, fluid and ill definition of fat planes. Example 55/2. Separate appendix not confidently identified. Vascular/Lymphatic: Aortic atherosclerosis. No abdominopelvic adenopathy. Reproductive: Mild prostatomegaly. Other: Small fat containing right inguinal hernia. No significant free fluid. No free intraperitoneal air. Musculoskeletal: Degenerative disc disease at the lumbosacral junction. IMPRESSION: 1. Decrease sensitivity exam secondary to motion, lack of oral/IV contrast, EKG wires and leads. 2. Edema, ill-defined fluid, centered about the cecum. If the patient has not had prior appendectomy, perforated appendicitis could have this appearance. Alternate etiologies include colitis, diverticulitis, or perforated colon cancer. Consider surgical consultation. Oral and IV contrast enhanced exam may be  helpful. 3. Cholelithiasis 4. Incidental findings, including: Coronary artery atherosclerosis. Aortic Atherosclerosis (ICD10-I70.0). 5. Prostatomegaly with bladder distension. Minimal left caliectasis and hydroureter are felt to be secondary. Electronically Signed   By: Lore Rode M.D.   On: 01/27/2024 18:27   DG Chest 2 View Result Date: 01/27/2024 CLINICAL DATA:  Fatigue EXAM: CHEST - 2 VIEW COMPARISON:  04/16/2023 FINDINGS: Heart and mediastinal contours are within normal limits. No focal opacities or effusions. No acute bony abnormality. Stable hyperinflation. IMPRESSION: Hyperinflation.  No active cardiopulmonary disease. Electronically Signed   By: Janeece Mechanic M.D.   On: 01/27/2024 17:31    Procedures Procedures    Medications Ordered in ED Medications  sodium chloride  0.9 % bolus 1,000 mL (has no administration in time range)  piperacillin -tazobactam (ZOSYN ) IVPB 3.375 g (has no administration in time range)  acetaminophen  (TYLENOL ) tablet 650 mg (has no administration in time range)    Or  acetaminophen  (TYLENOL ) suppository 650 mg (has no administration in time range)  melatonin tablet 3 mg (has no administration in time range)  ondansetron  (ZOFRAN ) injection 4 mg (has no administration in time range)  naloxone  (NARCAN ) injection 0.4 mg (has no administration in time range)  fentaNYL  (SUBLIMAZE ) injection 12.5 mcg (has no administration in time range)  lactated ringers  infusion (has no administration in time range)  piperacillin -tazobactam (ZOSYN ) IVPB 3.375 g (has no administration in time range)  insulin  aspart (novoLOG ) injection 0-6 Units (has no administration in time range)  dextrose  50 % solution 50 mL (has no administration in time range)    ED Course/ Medical Decision Making/ A&P                                 Medical Decision Making Amount and/or Complexity of Data Reviewed Labs: ordered. Radiology: ordered.  Risk Prescription drug management.   This  patient presents to the ED with chief complaint(s) of fatigue.  The complaint involves an extensive differential diagnosis and also carries with it a high risk of complications and morbidity.   Pertinent past medical history as listed in HPI  The differential diagnosis includes  URI, pneumonia, hypo-/hyperglycemia, hypo-/hyperthyroidism, cholecystitis, appendicitis, cystitis, pyelonephritis, anemia Additional history obtained: Additional history obtained from family Records reviewed Care Everywhere/External Records  Assessment and management:   Hemodynamically stable, afebrile, nontoxic-appearing patient presented with complaints of " feeling off and rundown".  Not associated with significant cough or congestion.  No chest pain, shortness of breath, abdominal pain vomiting or diarrhea.  Does report decreased appetite.  On exam his  lungs are clear without rales or wheezes.  He does have some very mild tenderness along the right flank with some mild distention, no obvious hernia.  Patient does have a history of pneumatosis.  His last bowel movement was approximately a day and a half ago.  Given these findings we will obtain CT abdomen pelvis.  Independent ECG interpretation:  Sinus rhythm, right axis deviation  Independent labs interpretation:  The following labs were independently interpreted:  Respiratory panel negative, troponin without elevation, CBC with leukocytosis of 22.5, hemoglobin stable at 12.2, CMP with mild hyponatremia of 132 and elevated creatinine to 2.18, baseline of 1.9, TSH within normal limits, respiratory panel negative, lactic within normal limits, CBG 263  Independent visualization and interpretation of imaging: I independently visualized the following imaging with scope of interpretation limited to determining acute life threatening conditions related to emergency care:  Chest x-ray no active cardiopulmonary disease CT abdomen pelvis   Consultations obtained:   General  Surgery Dr. Afton Horse recommended hospitalist admission, n.p.o., Zosyn , likely not surgical candidate CT surgery perforated appendix Hospitalist Dr. Camelia Cavalier agreed for admission  Disposition:   Patient will be admitted for what appears to be perforated appendicitis.  Social Determinants of Health:   none  This note was dictated with voice recognition software.  Despite best efforts at proofreading, errors may have occurred which can change the documentation meaning.          Final Clinical Impression(s) / ED Diagnoses Final diagnoses:  Urinary retention  Other acute appendicitis    Rx / DC Orders ED Discharge Orders     None         Felicie Horning, PA-C 01/27/24 [id number]    Mordecai Applebaum, MD 01/30/24 [phone]

## 2024-01-27 NOTE — Consult Note (Signed)
 Reason for Consult: Abdominal pain Referring Physician: Cecile Coder MD  Jorge Butler is an 89 y.o. male.  HPI: 89 year old male with 2-day history of right lower quad abdominal pain.  He was not feeling well.  Complains of pain in his right lower quadrant.  No nausea or vomiting.  Loss of appetite noted.  No fever or chills.  CT scan showed inflammatory process involving the cecum.  Unclear if is perforated appendicitis, colitis or diverticulitis.  History of previous pneumatosis at 1 year ago.  Has a creatinine of 2.18.  White count 22,000.  Past Medical History:  Diagnosis Date   Diabetes mellitus without complication (HCC)    Enlarged prostate    High cholesterol    Thyroid  disease     Past Surgical History:  Procedure Laterality Date   pituitory       Family History  Problem Relation Age of Onset   Heart disease Mother    Eczema Father    COPD Sister    Colon cancer Brother    Alcohol abuse Brother    Alcohol abuse Brother     Social History:  reports that he has never smoked. He has never used smokeless tobacco. No history on file for alcohol use and drug use.  Allergies:  Allergies  Allergen Reactions   Sulfa Antibiotics Nausea And Vomiting   Sulfamethoxazole-Trimethoprim Nausea And Vomiting    Medications: I have reviewed the patient's current medications.  Results for orders placed or performed during the hospital encounter of 01/27/24 (from the past 48 hours)  CBG monitoring, ED     Status: Abnormal   Collection Time: 01/27/24  3:36 PM  Result Value Ref Range   Glucose-Capillary 263 (H) 70 - 99 mg/dL    Comment: Glucose reference range applies only to samples taken after fasting for at least 8 hours.   Comment 1 Notify RN   Comprehensive metabolic panel     Status: Abnormal   Collection Time: 01/27/24  4:11 PM  Result Value Ref Range   Sodium 132 (L) 135 - 145 mmol/L   Potassium 4.6 3.5 - 5.1 mmol/L   Chloride 96 (L) 98 - 111 mmol/L   CO2 24 22 - 32  mmol/L   Glucose, Bld 260 (H) 70 - 99 mg/dL    Comment: Glucose reference range applies only to samples taken after fasting for at least 8 hours.   BUN 49 (H) 8 - 23 mg/dL   Creatinine, Ser 1.60 (H) 0.61 - 1.24 mg/dL   Calcium  9.2 8.9 - 10.3 mg/dL   Total Protein 7.2 6.5 - 8.1 g/dL   Albumin 3.1 (L) 3.5 - 5.0 g/dL   AST 20 15 - 41 U/L   ALT 24 0 - 44 U/L   Alkaline Phosphatase 81 38 - 126 U/L   Total Bilirubin 0.8 0.0 - 1.2 mg/dL   GFR, Estimated 28 (L) >60 mL/min    Comment: (NOTE) Calculated using the CKD-EPI Creatinine Equation (2021)    Anion gap 12 5 - 15    Comment: Performed at [facility], [address], [place], [place] [address]  CBC     Status: Abnormal   Collection Time: 01/27/24  4:11 PM  Result Value Ref Range   WBC 22.5 (H) 4.0 - 10.5 K/uL   RBC 4.07 (L) 4.22 - 5.81 MIL/uL   Hemoglobin 12.2 (L) 13.0 - 17.0 g/dL   HCT 62.6 (L) 94.8 - 54.6 %   MCV 91.9 80.0 - 100.0  fL   MCH 30.0 26.0 - 34.0 pg   MCHC 32.6 30.0 - 36.0 g/dL   RDW 56.2 13.0 - 86.5 %   Platelets 309 150 - 400 K/uL   nRBC 0.0 0.0 - 0.2 %    Comment: Performed at [facility], [address] [place], [place] [address]  Urinalysis, Routine w reflex microscopic -Urine, Clean Catch     Status: Abnormal   Collection Time: 01/27/24  4:11 PM  Result Value Ref Range   Color, Urine YELLOW YELLOW   APPearance CLEAR CLEAR   Specific Gravity, Urine 1.013 1.005 - 1.030   pH 6.0 5.0 - 8.0   Glucose, UA >=500 (A) NEGATIVE mg/dL   Hgb urine dipstick NEGATIVE NEGATIVE   Bilirubin Urine NEGATIVE NEGATIVE   Ketones, ur NEGATIVE NEGATIVE mg/dL   Protein, ur 629 (A) NEGATIVE mg/dL   Nitrite NEGATIVE NEGATIVE   Leukocytes,Ua NEGATIVE NEGATIVE   RBC / HPF 0-5 0 - 5 RBC/hpf   WBC, UA 0-5 0 - 5 WBC/hpf   Bacteria, UA NONE SEEN NONE SEEN   Squamous Epithelial / HPF 0-5 0 - 5 /HPF   Mucus PRESENT    Hyaline Casts, UA PRESENT     Comment: Performed at [facility], [address] [place], [place] [address]  TSH     Status: None   Collection Time: 01/27/24  4:11 PM  Result Value Ref Range   TSH 0.834 0.350 - 4.500 uIU/mL    Comment: Performed by a 3rd Generation assay with a functional sensitivity of <=0.01 uIU/mL. Performed at [facility], [address], [place], [place] [address]   Resp panel by RT-PCR (RSV, Flu A&B, Covid) Anterior Nasal Swab     Status: None   Collection Time: 01/27/24  4:11 PM   Specimen: Anterior Nasal Swab  Result Value Ref Range   SARS Coronavirus 2 by RT PCR NEGATIVE NEGATIVE    Comment: (NOTE) SARS-CoV-2 target nucleic acids are NOT DETECTED.  The SARS-CoV-2 RNA is generally detectable in upper respiratory specimens during the acute phase of infection. The lowest concentration of SARS-CoV-2 viral copies this assay can detect is 138 copies/mL. A negative result does not preclude SARS-Cov-2 infection and should not be used as the sole basis for treatment or other patient management decisions. A negative result may occur with  improper specimen collection/handling, submission of specimen other than nasopharyngeal swab, presence of viral mutation(s) within the areas targeted by this assay, and inadequate number of viral copies(<138 copies/mL). A negative result must be combined with clinical observations, patient history, and epidemiological information. The expected result is Negative.  Fact Sheet for Patients:  BloggerCourse.com  Fact Sheet for Healthcare Providers:  SeriousBroker.it  This test is no t yet approved or cleared by the [place]  FDA and  has been authorized for detection and/or diagnosis of SARS-CoV-2 by FDA under an Emergency Use Authorization (EUA). This EUA will remain  in effect (meaning this test can be used) for the duration of the COVID-19 declaration under Section 564(b)(1) of the Act, 21 U.S.C.section  360bbb-3(b)(1), unless the authorization is terminated  or revoked sooner.       Influenza A by PCR NEGATIVE NEGATIVE   Influenza B by PCR NEGATIVE NEGATIVE    Comment: (NOTE) The Xpert Xpress SARS-CoV-2/FLU/RSV plus assay is intended as an aid in the diagnosis of influenza from Nasopharyngeal swab specimens and should not be used as a sole basis for treatment. Nasal  washings and aspirates are unacceptable for Xpert Xpress SARS-CoV-2/FLU/RSV testing.  Fact Sheet for Patients: BloggerCourse.com  Fact Sheet for Healthcare Providers: SeriousBroker.it  This test is not yet approved or cleared by the [place]  FDA and has been authorized for detection and/or diagnosis of SARS-CoV-2 by FDA under an Emergency Use Authorization (EUA). This EUA will remain in effect (meaning this test can be used) for the duration of the COVID-19 declaration under Section 564(b)(1) of the Act, 21 U.S.C. section 360bbb-3(b)(1), unless the authorization is terminated or revoked.     Resp Syncytial Virus by PCR NEGATIVE NEGATIVE    Comment: (NOTE) Fact Sheet for Patients: BloggerCourse.com  Fact Sheet for Healthcare Providers: SeriousBroker.it  This test is not yet approved or cleared by the [place]  FDA and has been authorized for detection and/or diagnosis of SARS-CoV-2 by FDA under an Emergency Use Authorization (EUA). This EUA will remain in effect (meaning this test can be used) for the duration of the COVID-19 declaration under Section 564(b)(1) of the Act, 21 U.S.C. section 360bbb-3(b)(1), unless the authorization is terminated or revoked.  Performed at [facility], [address] [place], [place] [address]   Troponin I (High Sensitivity)     Status: None   Collection Time: 01/27/24  4:11 PM  Result Value Ref Range   Troponin I (High Sensitivity) 9 <18 ng/L     Comment: (NOTE) Elevated high sensitivity troponin I (hsTnI) values and significant  changes across serial measurements may suggest ACS but many other  chronic and acute conditions are known to elevate hsTnI results.  Refer to the "Links" section for chest pain algorithms and additional  guidance. Performed at [facility], [address] [place], [place] [address]   Differential     Status: Abnormal   Collection Time: 01/27/24  4:11 PM  Result Value Ref Range   Neutrophils Relative % 84 %   Neutro Abs 18.5 (H) 1.7 - 7.7 K/uL   Lymphocytes Relative 7 %   Lymphs Abs 1.5 0.7 - 4.0 K/uL   Monocytes Relative 8 %   Monocytes Absolute 1.7 (H) 0.1 - 1.0 K/uL   Eosinophils Relative 0 %   Eosinophils Absolute 0.0 0.0 - 0.5 K/uL   Basophils Relative 0 %   Basophils Absolute 0.1 0.0 - 0.1 K/uL   Immature Granulocytes 1 %   Abs Immature Granulocytes 0.15 (H) 0.00 - 0.07 K/uL    Comment: Performed at [facility], [address], [place], [place] [address]  I-Stat CG4 Lactic Acid     Status: None   Collection Time: 01/27/24  5:18 PM  Result Value Ref Range   Lactic Acid, Venous 1.0 0.5 - 1.9 mmol/L  Troponin I (High Sensitivity)     Status: None   Collection Time: 01/27/24  6:46 PM  Result Value Ref Range   Troponin I (High Sensitivity) 8 <18 ng/L    Comment: (NOTE) Elevated high sensitivity troponin I (hsTnI) values and significant  changes across serial measurements may suggest ACS but many other  chronic and acute conditions are known to elevate hsTnI results.  Refer to the "Links" section for chest pain algorithms and additional  guidance. Performed at [facility], [address], [place], [place] [address]   I-Stat CG4 Lactic Acid     Status: None   Collection Time: 01/27/24  6:55 PM  Result Value Ref Range   Lactic Acid, Venous 0.7 0.5 - 1.9 mmol/L  CT ABDOMEN PELVIS WO CONTRAST Result Date: 01/27/2024 CLINICAL  DATA:  History of pneumatosis with abdominal distension and right flank pain. EXAM: CT ABDOMEN AND PELVIS WITHOUT CONTRAST TECHNIQUE: Multidetector CT imaging of the abdomen and pelvis was performed following the standard protocol without IV contrast. RADIATION DOSE REDUCTION: This exam was performed according to the departmental dose-optimization program which includes automated exposure control, adjustment of the mA and/or kV according to patient size and/or use of iterative reconstruction technique. COMPARISON:  04/16/2023 FINDINGS: Mild motion degradation. EKG wires and leads cause artifact. Lack of oral or IV contrast. Lower chest: Bibasilar scarring. Heart size accentuated by a pectus excavatum deformity. Right coronary artery calcification. Hepatobiliary: Normal noncontrast appearance of the liver. Dependent gallstone without acute cholecystitis or biliary duct dilatation. Pancreas: Normal, without mass or ductal dilatation. Spleen: Normal in size, without focal abnormality. Adrenals/Urinary Tract: Normal adrenal glands. Renal cortical thinning is mild, within normal variation for age. Minimal left-sided caliectasis. Minimal bilateral ureteric prominence, likely due to bladder distension, which is moderate. Stomach/BOwel: Normal stomach, without wall thickening. Descending duodenal diverticulum or diverticula. Otherwise normal small bowel. Extensive colonic diverticulosis. Normal terminal ileum. About the cecum is inflammation, fluid and ill definition of fat planes. Example 55/2. Separate appendix not confidently identified. Vascular/Lymphatic: Aortic atherosclerosis. No abdominopelvic adenopathy. Reproductive: Mild prostatomegaly. Other: Small fat containing right inguinal hernia. No significant free fluid. No free intraperitoneal air. Musculoskeletal: Degenerative disc disease at the lumbosacral junction. IMPRESSION: 1. Decrease sensitivity exam secondary to motion, lack of oral/IV contrast, EKG wires and  leads. 2. Edema, ill-defined fluid, centered about the cecum. If the patient has not had prior appendectomy, perforated appendicitis could have this appearance. Alternate etiologies include colitis, diverticulitis, or perforated colon cancer. Consider surgical consultation. Oral and IV contrast enhanced exam may be helpful. 3. Cholelithiasis 4. Incidental findings, including: Coronary artery atherosclerosis. Aortic Atherosclerosis (ICD10-I70.0). 5. Prostatomegaly with bladder distension. Minimal left caliectasis and hydroureter are felt to be secondary. Electronically Signed   By: Lore Rode M.D.   On: 01/27/2024 18:27   DG Chest 2 View Result Date: 01/27/2024 CLINICAL DATA:  Fatigue EXAM: CHEST - 2 VIEW COMPARISON:  04/16/2023 FINDINGS: Heart and mediastinal contours are within normal limits. No focal opacities or effusions. No acute bony abnormality. Stable hyperinflation. IMPRESSION: Hyperinflation.  No active cardiopulmonary disease. Electronically Signed   By: Janeece Mechanic M.D.   On: 01/27/2024 17:31    Review of Systems  Constitutional:  Positive for appetite change and fatigue. Negative for fever.  HENT: Negative.    Eyes: Negative.   Respiratory:  Negative for choking and chest tightness.   Cardiovascular:  Negative for chest pain and leg swelling.  Gastrointestinal:  Positive for abdominal pain.  Genitourinary: Negative.   Musculoskeletal: Negative.   Neurological: Negative.   Hematological: Negative.   Psychiatric/Behavioral: Negative.     Blood pressure 134/60, pulse 78, temperature 99 F (37.2 C), temperature source Oral, resp. rate 16, height 5\' 10"  (1.778 m), weight 77.1 kg, SpO2 100%. Physical Exam Vitals reviewed.  Abdominal:     General: Abdomen is protuberant. There is no distension.     Palpations: Abdomen is soft. There is mass.     Tenderness: There is abdominal tenderness in the right lower quadrant. There is guarding. There is no rebound.       Comments: Tender  right upper quadrant with palpable mass no peritonitis  Neurological:     Mental Status: He is alert.     Assessment/Plan: Abdominal pain-differential diagnosis  includes perforated appendicitis, colon cancer, diverticulitis/colitis.  He does have a palpable mass in his right lower quadrant and his last colonoscopy was over 12 years ago.  This may represent a large colon cancer.  Less likely perforated appendicitis.  No acute surgical indication at this time.  Recommend IV antibiotics, IV fluids, and a clear liquid diet if the patient so desires.  General surgery service to follow-up in a.m.  May need colonoscopy at some point to sort this out depending on clinical course.  Jorge Butler 01/27/2024, 10:31 PM   High complexity

## 2024-01-27 NOTE — ED Notes (Signed)
 Patient attempted to use urinal unable to give sample at this time

## 2024-01-27 NOTE — H&P (Signed)
 History and Physical      Jorge Butler EAV:[id number] DOB: November 08, 1931 DOA: 01/27/2024; DOS: 01/27/2024  PCP: Margarete Sharps, MD *** Patient coming from: home ***  I have personally briefly reviewed patient's old medical records in [facility]  Chief Complaint: ***  HPI: IVERY Butler is a 89 y.o. male with medical history significant for *** who is admitted to [facility] on 01/27/2024 with *** after presenting from home*** to [facility] ED complaining of ***.   ***        ***  ED Course:  Vital signs in the ED were notable for the following: ***  Labs were notable for the following: ***  Per my interpretation, EKG in ED demonstrated the following:  ***  Imaging in the ED, per corresponding formal radiology read, was notable for the following: ***  EDP discussed patient's case with on-call general surgery, Dr. Afton Horse, who requests TRH admission, and conveys that general surgery will formally consult see the patient in the morning.  Dr.Cornett conveys that, preliminarily, the patient appears to be a poor surgical candidate, and does not recommend urgent/emergent overnight surgical intervention at this time.  Rather, he recommends n.p.o., broad-spectrum IV antibiotic's, with additional general surgery recommendations to occur in the morning.  While in the ED, the following were administered: ***  Subsequently, the patient was admitted  ***  ***red   Review of Systems: As per HPI otherwise 10 point review of systems negative.   Past Medical History:  Diagnosis Date   Diabetes mellitus without complication (HCC)    Enlarged prostate    High cholesterol    Thyroid  disease     Past Surgical History:  Procedure Laterality Date   pituitory       Social History:  reports that he has never smoked. He has never used smokeless tobacco. No history on file for alcohol use and drug use.   Allergies  Allergen Reactions   Sulfa Antibiotics Nausea And  Vomiting   Sulfamethoxazole-Trimethoprim Nausea And Vomiting    Family History  Problem Relation Age of Onset   Heart disease Mother    Eczema Father    COPD Sister    Colon cancer Brother    Alcohol abuse Brother    Alcohol abuse Brother     Family history reviewed and not pertinent ***   Prior to Admission medications   Medication Sig Start Date End Date Taking? Authorizing Provider  aspirin  EC 81 MG tablet Take 81 mg by mouth once. 08/26/13  Yes [provider]  atorvastatin  (LIPITOR) 20 MG tablet Take 20 mg by mouth every evening. 12/04/22  Yes [provider]  finasteride  (PROSCAR ) 5 MG tablet Take 5 mg by mouth daily. 08/02/09  Yes [provider]  glipiZIDE  (GLUCOTROL ) 5 MG tablet Take 5 mg by mouth 2 (two) times daily before a meal. 12/04/22  Yes [provider]  levothyroxine  (SYNTHROID ) 88 MCG tablet Take 88 mcg by mouth daily before breakfast. 12/04/22  Yes [provider]  pioglitazone (ACTOS) 45 MG tablet Take 45 mg by mouth daily. 06/04/22  Yes [provider]  SITagliptin 25 MG TABS Take 1 tablet by mouth daily.   Yes [provider]  tamsulosin  (FLOMAX ) 0.4 MG CAPS capsule Take 0.4 mg by mouth daily. 03/22/20  Yes [provider]  ondansetron  (ZOFRAN ) 4 MG tablet Take 1 tablet (4 mg total) by mouth every 6 (six) hours as needed for nausea. Patient not taking: Reported on  01/27/2024 04/19/23   Ezzard Holms, MD  polyethylene glycol (MIRALAX  / GLYCOLAX ) 17 g packet Take 17 g by mouth daily. Patient not taking: Reported on 01/27/2024 04/20/23   Ezzard Holms, MD     Objective    Physical Exam: Vitals:   01/27/24 1537 01/27/24 1538 01/27/24 1800 01/27/24 2011  BP:  (!) 134/53 (!) 141/53 134/60  Pulse: 96  81 78  Resp: 17  18 16   Temp: 98.3 F (36.8 C)   99 F (37.2 C)  TempSrc: Oral   Oral  SpO2: 100%  100% 100%  Weight:  77.1 kg    Height:  5\' 10"  (1.778 m)      General: appears to be stated  age; alert, oriented Skin: warm, dry, no rash Head:  AT/[place] Mouth:  Oral mucosa membranes appear moist, normal dentition Neck: supple; trachea midline Heart:  RRR; did not appreciate any M/R/G Lungs: CTAB, did not appreciate any wheezes, rales, or rhonchi Abdomen: + BS; soft, ND, NT Vascular: 2+ pedal pulses b/l; 2+ radial pulses b/l Extremities: no peripheral edema, no muscle wasting Neuro: strength and sensation intact in upper and lower extremities b/l    *** Neuro: 5/5 strength of the proximal and distal flexors and extensors of the upper and lower extremities bilaterally; sensation intact in upper and lower extremities b/l; cranial nerves II through XII grossly intact; no pronator drift; no evidence suggestive of slurred speech, dysarthria, or facial droop; Normal muscle tone. No tremors. *** Neuro: In the setting of the patient's current mental status and associated inability to follow instructions, unable to perform full neurologic exam at this time.  As such, assessment of strength, sensation, and cranial nerves is limited at this time. Patient noted to spontaneously move all 4 extremities. No tremors.  ***    Labs on Admission: I have personally reviewed following labs and imaging studies  CBC: Recent Labs  Lab 01/27/24 1611  WBC 22.5*  NEUTROABS 18.5*  HGB 12.2*  HCT 37.4*  MCV 91.9  PLT 309   Basic Metabolic Panel: Recent Labs  Lab 01/27/24 1611  NA 132*  K 4.6  CL 96*  CO2 24  GLUCOSE 260*  BUN 49*  CREATININE 2.18*  CALCIUM  9.2   GFR: Estimated Creatinine Clearance: 23.3 mL/min (A) (by C-G formula based on SCr of 2.18 mg/dL (H)). Liver Function Tests: Recent Labs  Lab 01/27/24 1611  AST 20  ALT 24  ALKPHOS 81  BILITOT 0.8  PROT 7.2  ALBUMIN 3.1*   No results for input(s): "LIPASE", "AMYLASE" in the last 168 hours. No results for input(s): "AMMONIA" in the last 168 hours. Coagulation Profile: No results for input(s): "INR", "PROTIME" in the  last 168 hours. Cardiac Enzymes: No results for input(s): "CKTOTAL", "CKMB", "CKMBINDEX", "TROPONINI" in the last 168 hours. BNP (last 3 results) No results for input(s): "PROBNP" in the last 8760 hours. HbA1C: No results for input(s): "HGBA1C" in the last 72 hours. CBG: Recent Labs  Lab 01/27/24 1536  GLUCAP 263*   Lipid Profile: No results for input(s): "CHOL", "HDL", "LDLCALC", "TRIG", "CHOLHDL", "LDLDIRECT" in the last 72 hours. Thyroid  Function Tests: Recent Labs    01/27/24 1611  TSH 0.834   Anemia Panel: No results for input(s): "VITAMINB12", "FOLATE", "FERRITIN", "TIBC", "IRON", "RETICCTPCT" in the last 72 hours. Urine analysis:    Component Value Date/Time   COLORURINE YELLOW 01/27/2024 1611   APPEARANCEUR CLEAR 01/27/2024 1611   LABSPEC 1.013 01/27/2024 1611   PHURINE 6.0 01/27/2024  1611   GLUCOSEU >=500 (A) 01/27/2024 1611   HGBUR NEGATIVE 01/27/2024 1611   BILIRUBINUR NEGATIVE 01/27/2024 1611   BILIRUBINUR negative 04/16/2023 1130   KETONESUR NEGATIVE 01/27/2024 1611   PROTEINUR 100 (A) 01/27/2024 1611   UROBILINOGEN 0.2 04/16/2023 1130   NITRITE NEGATIVE 01/27/2024 1611   LEUKOCYTESUR NEGATIVE 01/27/2024 1611    Radiological Exams on Admission: CT ABDOMEN PELVIS WO CONTRAST Result Date: 01/27/2024 CLINICAL DATA:  History of pneumatosis with abdominal distension and right flank pain. EXAM: CT ABDOMEN AND PELVIS WITHOUT CONTRAST TECHNIQUE: Multidetector CT imaging of the abdomen and pelvis was performed following the standard protocol without IV contrast. RADIATION DOSE REDUCTION: This exam was performed according to the departmental dose-optimization program which includes automated exposure control, adjustment of the mA and/or kV according to patient size and/or use of iterative reconstruction technique. COMPARISON:  04/16/2023 FINDINGS: Mild motion degradation. EKG wires and leads cause artifact. Lack of oral or IV contrast. Lower chest: Bibasilar scarring.  Heart size accentuated by a pectus excavatum deformity. Right coronary artery calcification. Hepatobiliary: Normal noncontrast appearance of the liver. Dependent gallstone without acute cholecystitis or biliary duct dilatation. Pancreas: Normal, without mass or ductal dilatation. Spleen: Normal in size, without focal abnormality. Adrenals/Urinary Tract: Normal adrenal glands. Renal cortical thinning is mild, within normal variation for age. Minimal left-sided caliectasis. Minimal bilateral ureteric prominence, likely due to bladder distension, which is moderate. Stomach/BOwel: Normal stomach, without wall thickening. Descending duodenal diverticulum or diverticula. Otherwise normal small bowel. Extensive colonic diverticulosis. Normal terminal ileum. About the cecum is inflammation, fluid and ill definition of fat planes. Example 55/2. Separate appendix not confidently identified. Vascular/Lymphatic: Aortic atherosclerosis. No abdominopelvic adenopathy. Reproductive: Mild prostatomegaly. Other: Small fat containing right inguinal hernia. No significant free fluid. No free intraperitoneal air. Musculoskeletal: Degenerative disc disease at the lumbosacral junction. IMPRESSION: 1. Decrease sensitivity exam secondary to motion, lack of oral/IV contrast, EKG wires and leads. 2. Edema, ill-defined fluid, centered about the cecum. If the patient has not had prior appendectomy, perforated appendicitis could have this appearance. Alternate etiologies include colitis, diverticulitis, or perforated colon cancer. Consider surgical consultation. Oral and IV contrast enhanced exam may be helpful. 3. Cholelithiasis 4. Incidental findings, including: Coronary artery atherosclerosis. Aortic Atherosclerosis (ICD10-I70.0). 5. Prostatomegaly with bladder distension. Minimal left caliectasis and hydroureter are felt to be secondary. Electronically Signed   By: Lore Rode M.D.   On: 01/27/2024 18:27   DG Chest 2 View Result Date:  01/27/2024 CLINICAL DATA:  Fatigue EXAM: CHEST - 2 VIEW COMPARISON:  04/16/2023 FINDINGS: Heart and mediastinal contours are within normal limits. No focal opacities or effusions. No acute bony abnormality. Stable hyperinflation. IMPRESSION: Hyperinflation.  No active cardiopulmonary disease. Electronically Signed   By: Janeece Mechanic M.D.   On: 01/27/2024 17:31      Assessment/Plan    Principal Problem:   Perforated appendicitis  ***        ***                  ***                  ***                  ***                  ***                 ***                  ***                  ***                  ***                  ***                  ***                  ***                 ***  DVT prophylaxis: SCD's ***  Code Status: Full code*** Family Communication: none*** Disposition Plan: Per Rounding Team Consults called: EDP discussed patient's case with on-call general surgery, Dr. Afton Horse, who requests TRH admission, and conveys that general surgery will formally consult see the patient in the morning.  ;  Admission status: ***    I SPENT GREATER THAN 75 *** MINUTES IN CLINICAL CARE TIME/MEDICAL DECISION-MAKING IN COMPLETING THIS ADMISSION.     Gattis Kass Elhadj Girton DO [facility] From 7PM - 7AM   01/27/2024, 10:42 PM   ***

## 2024-01-28 ENCOUNTER — Encounter (HOSPITAL_COMMUNITY): Payer: Self-pay | Admitting: Internal Medicine

## 2024-01-28 DIAGNOSIS — K3532 Acute appendicitis with perforation and localized peritonitis, without abscess: Secondary | ICD-10-CM | POA: Diagnosis not present

## 2024-01-28 DIAGNOSIS — R109 Unspecified abdominal pain: Secondary | ICD-10-CM | POA: Diagnosis present

## 2024-01-28 DIAGNOSIS — N1832 Chronic kidney disease, stage 3b: Secondary | ICD-10-CM | POA: Diagnosis present

## 2024-01-28 DIAGNOSIS — E119 Type 2 diabetes mellitus without complications: Secondary | ICD-10-CM

## 2024-01-28 DIAGNOSIS — A419 Sepsis, unspecified organism: Secondary | ICD-10-CM | POA: Diagnosis present

## 2024-01-28 LAB — CBC WITH DIFFERENTIAL/PLATELET
Abs Immature Granulocytes: 0.18 10*3/uL — ABNORMAL HIGH (ref 0.00–0.07)
Basophils Absolute: 0.1 10*3/uL (ref 0.0–0.1)
Basophils Relative: 0 %
Eosinophils Absolute: 0 10*3/uL (ref 0.0–0.5)
Eosinophils Relative: 0 %
HCT: 39.7 % (ref 39.0–52.0)
Hemoglobin: 12.7 g/dL — ABNORMAL LOW (ref 13.0–17.0)
Immature Granulocytes: 1 %
Lymphocytes Relative: 7 %
Lymphs Abs: 1.5 10*3/uL (ref 0.7–4.0)
MCH: 29.8 pg (ref 26.0–34.0)
MCHC: 32 g/dL (ref 30.0–36.0)
MCV: 93.2 fL (ref 80.0–100.0)
Monocytes Absolute: 1.6 10*3/uL — ABNORMAL HIGH (ref 0.1–1.0)
Monocytes Relative: 7 %
Neutro Abs: 19.7 10*3/uL — ABNORMAL HIGH (ref 1.7–7.7)
Neutrophils Relative %: 85 %
Platelets: 298 10*3/uL (ref 150–400)
RBC: 4.26 MIL/uL (ref 4.22–5.81)
RDW: 13.8 % (ref 11.5–15.5)
WBC: 23 10*3/uL — ABNORMAL HIGH (ref 4.0–10.5)
nRBC: 0 % (ref 0.0–0.2)

## 2024-01-28 LAB — TYPE AND SCREEN
ABO/RH(D): O NEG
Antibody Screen: NEGATIVE

## 2024-01-28 LAB — COMPREHENSIVE METABOLIC PANEL WITH GFR
ALT: 23 U/L (ref 0–44)
AST: 21 U/L (ref 15–41)
Albumin: 3.1 g/dL — ABNORMAL LOW (ref 3.5–5.0)
Alkaline Phosphatase: 79 U/L (ref 38–126)
Anion gap: 11 (ref 5–15)
BUN: 45 mg/dL — ABNORMAL HIGH (ref 8–23)
CO2: 22 mmol/L (ref 22–32)
Calcium: 8.9 mg/dL (ref 8.9–10.3)
Chloride: 96 mmol/L — ABNORMAL LOW (ref 98–111)
Creatinine, Ser: 2.01 mg/dL — ABNORMAL HIGH (ref 0.61–1.24)
GFR, Estimated: 31 mL/min — ABNORMAL LOW (ref >60)
Glucose, Bld: 178 mg/dL — ABNORMAL HIGH (ref 70–99)
Potassium: 3.9 mmol/L (ref 3.5–5.1)
Sodium: 129 mmol/L — ABNORMAL LOW (ref 135–145)
Total Bilirubin: 1.2 mg/dL (ref 0.0–1.2)
Total Protein: 7.2 g/dL (ref 6.5–8.1)

## 2024-01-28 LAB — HEMOGLOBIN A1C
Hgb A1c MFr Bld: 7 % — ABNORMAL HIGH (ref 4.8–5.6)
Mean Plasma Glucose: 154.2 mg/dL

## 2024-01-28 LAB — GLUCOSE, CAPILLARY
Glucose-Capillary: 100 mg/dL — ABNORMAL HIGH (ref 70–99)
Glucose-Capillary: 126 mg/dL — ABNORMAL HIGH (ref 70–99)
Glucose-Capillary: 175 mg/dL — ABNORMAL HIGH (ref 70–99)
Glucose-Capillary: 243 mg/dL — ABNORMAL HIGH (ref 70–99)

## 2024-01-28 LAB — PROTIME-INR
INR: 1.2 (ref 0.8–1.2)
Prothrombin Time: 14.9 s (ref 11.4–15.2)

## 2024-01-28 LAB — ABO/RH: ABO/RH(D): O NEG

## 2024-01-28 LAB — MAGNESIUM: Magnesium: 2.3 mg/dL (ref 1.7–2.4)

## 2024-01-28 MED ORDER — CHLORHEXIDINE GLUCONATE CLOTH 2 % EX PADS
6.0000 | MEDICATED_PAD | Freq: Every day | CUTANEOUS | Status: DC
  Administered 2024-01-29: 6 via TOPICAL

## 2024-01-28 MED ORDER — LACTATED RINGERS IV SOLN: INTRAVENOUS | Status: AC

## 2024-01-28 NOTE — Progress Notes (Signed)
 Mobility Specialist - Progress Note   01/28/24 1320  Mobility  Activity Ambulated with assistance in hallway;Ambulated with assistance to bathroom  Level of Assistance Contact guard assist, steadying assist  Assistive Device Other (Comment) (IV Pole)  Distance Ambulated (ft) 300 ft  Activity Response Tolerated well  Mobility Referral Yes  Mobility visit 1 Mobility  Mobility Specialist Start Time (ACUTE ONLY) 1254  Mobility Specialist Stop Time (ACUTE ONLY) 1319  Mobility Specialist Time Calculation (min) (ACUTE ONLY) 25 min   Pt received in recliner and agreeable to mobility. Prior to ambulating, pt requested assistance to the bathroom for BM. No complaints during session. Pt to recliner after session with all needs met. Family in room. RN aware of session.   [facility]

## 2024-01-28 NOTE — Hospital Course (Signed)
 89yo with h/o DM, HLD, hypothyroidism, and stage 3B CKD (Cr 1.8-2.3 baseline) who presented on 5/6 with fever and RLQ pain.  CT with LLQ mass; surgery is recommending conservative management with CLD, IVF, and IV antibiotics.

## 2024-01-28 NOTE — Progress Notes (Signed)
 Subjective: CC: Reports resolution of pain at rest. No n/v. BM yesterday. He has been sipping on water.   Afebrile. No tachycardia or systolic hypotension. WBC 23 (22.5). LA wnl on last check.   He reports his last colonoscopy was in the 70's. Last colonoscopy on file 2012 w/ no evidence of colon polyps and no significant diverticular disease.  Family hx of colon ca in his brother pre chart review.   No prior abdominal surgeries. Was here in July 2024 for pneumatosis that was tx non-op.   Objective: Vital signs in last 24 hours: Temp:  [98.3 F (36.8 C)-99 F (37.2 C)] 98.5 F (36.9 C) (05/07 0422) Pulse Rate:  [78-96] 83 (05/07 0422) Resp:  [16-18] 16 (05/07 0422) BP: (113-141)/(46-60) 113/46 (05/07 0422) SpO2:  [97 %-100 %] 97 % (05/07 0422) Weight:  [77.1 kg-78.2 kg] 78.2 kg (05/07 0427) Last BM Date : 01/25/24  Intake/Output from previous day: 05/06 0701 - 05/07 0700 In: 0  Out: 670 [Urine:670] Intake/Output this shift: No intake/output data recorded.  PE: Gen:  Alert, NAD, pleasant Abd: Soft, mild distension, RLQ ttp with question of palpable mass - no rigidity or guarding. +BS Psych: A&Ox3   Lab Results:  Recent Labs    01/27/24 1611 01/28/24 0011  WBC 22.5* 23.0*  HGB 12.2* 12.7*  HCT 37.4* 39.7  PLT 309 298   BMET Recent Labs    01/27/24 1611 01/28/24 0011  NA 132* 129*  K 4.6 3.9  CL 96* 96*  CO2 24 22  GLUCOSE 260* 178*  BUN 49* 45*  CREATININE 2.18* 2.01*  CALCIUM  9.2 8.9   PT/INR Recent Labs    01/28/24 0011  LABPROT 14.9  INR 1.2   CMP     Component Value Date/Time   NA 129 (L) 01/28/2024 0011   K 3.9 01/28/2024 0011   CL 96 (L) 01/28/2024 0011   CO2 22 01/28/2024 0011   GLUCOSE 178 (H) 01/28/2024 0011   BUN 45 (H) 01/28/2024 0011   CREATININE 2.01 (H) 01/28/2024 0011   CALCIUM  8.9 01/28/2024 0011   PROT 7.2 01/28/2024 0011   ALBUMIN 3.1 (L) 01/28/2024 0011   AST 21 01/28/2024 0011   ALT 23 01/28/2024 0011    ALKPHOS 79 01/28/2024 0011   BILITOT 1.2 01/28/2024 0011   GFRNONAA 31 (L) 01/28/2024 0011   Lipase     Component Value Date/Time   LIPASE 28 04/16/2023 1315    Studies/Results: CT ABDOMEN PELVIS WO CONTRAST Result Date: 01/27/2024 CLINICAL DATA:  History of pneumatosis with abdominal distension and right flank pain. EXAM: CT ABDOMEN AND PELVIS WITHOUT CONTRAST TECHNIQUE: Multidetector CT imaging of the abdomen and pelvis was performed following the standard protocol without IV contrast. RADIATION DOSE REDUCTION: This exam was performed according to the departmental dose-optimization program which includes automated exposure control, adjustment of the mA and/or kV according to patient size and/or use of iterative reconstruction technique. COMPARISON:  04/16/2023 FINDINGS: Mild motion degradation. EKG wires and leads cause artifact. Lack of oral or IV contrast. Lower chest: Bibasilar scarring. Heart size accentuated by a pectus excavatum deformity. Right coronary artery calcification. Hepatobiliary: Normal noncontrast appearance of the liver. Dependent gallstone without acute cholecystitis or biliary duct dilatation. Pancreas: Normal, without mass or ductal dilatation. Spleen: Normal in size, without focal abnormality. Adrenals/Urinary Tract: Normal adrenal glands. Renal cortical thinning is mild, within normal variation for age. Minimal left-sided caliectasis. Minimal bilateral ureteric prominence, likely due to bladder distension, which  is moderate. Stomach/BOwel: Normal stomach, without wall thickening. Descending duodenal diverticulum or diverticula. Otherwise normal small bowel. Extensive colonic diverticulosis. Normal terminal ileum. About the cecum is inflammation, fluid and ill definition of fat planes. Example 55/2. Separate appendix not confidently identified. Vascular/Lymphatic: Aortic atherosclerosis. No abdominopelvic adenopathy. Reproductive: Mild prostatomegaly. Other: Small fat containing  right inguinal hernia. No significant free fluid. No free intraperitoneal air. Musculoskeletal: Degenerative disc disease at the lumbosacral junction. IMPRESSION: 1. Decrease sensitivity exam secondary to motion, lack of oral/IV contrast, EKG wires and leads. 2. Edema, ill-defined fluid, centered about the cecum. If the patient has not had prior appendectomy, perforated appendicitis could have this appearance. Alternate etiologies include colitis, diverticulitis, or perforated colon cancer. Consider surgical consultation. Oral and IV contrast enhanced exam may be helpful. 3. Cholelithiasis 4. Incidental findings, including: Coronary artery atherosclerosis. Aortic Atherosclerosis (ICD10-I70.0). 5. Prostatomegaly with bladder distension. Minimal left caliectasis and hydroureter are felt to be secondary. Electronically Signed   By: Lore Rode M.D.   On: 01/27/2024 18:27   DG Chest 2 View Result Date: 01/27/2024 CLINICAL DATA:  Fatigue EXAM: CHEST - 2 VIEW COMPARISON:  04/16/2023 FINDINGS: Heart and mediastinal contours are within normal limits. No focal opacities or effusions. No acute bony abnormality. Stable hyperinflation. IMPRESSION: Hyperinflation.  No active cardiopulmonary disease. Electronically Signed   By: Janeece Mechanic M.D.   On: 01/27/2024 17:31    Anti-infectives: Anti-infectives (From admission, onward)    Start     Dose/Rate Route Frequency Ordered Stop   01/28/24 0000  piperacillin -tazobactam (ZOSYN ) IVPB 3.375 g        3.375 g 12.5 mL/hr over 240 Minutes Intravenous Every 8 hours 01/27/24 2240     01/27/24 2130  piperacillin -tazobactam (ZOSYN ) IVPB 3.375 g  Status:  Discontinued        3.375 g 100 mL/hr over 30 Minutes Intravenous  Once 01/27/24 2122 01/27/24 2342        Assessment/Plan RLQ abdominal pain - CT findings noted.  Unclear if this is from colitis, diverticulitis, perforated appendicitis or even potentially a colon cancer. -  Last colonoscopy on file 2012 w/ no  evidence of colon polyps and no significant diverticular disease. Family hx of colon ca in his brother pre chart review. CEA pending.  - No current indication for emergency surgery.  He is hemodynamically stable without fever, tachycardia or systolic hypotension.  No peritonitis on exam. - Continue IV antibiotics - Would not advance past CLD - Daily labs - May need colonoscopy at some point to help further evaluate  - We will follow closely during admission.  Patient may require surgery during admission but would like to give some time to see how he responds to IV antibiotics at this time. Further recs to follow.   FEN - CLD, IVF per primary  VTE - SCDs, okay for chem ppx from a general surgery standpoint ID - Zosyn . Afebrile. WBC 23 (22.5)  I reviewed nursing notes, hospitalist notes, last 24 h vitals and pain scores, last 48 h intake and output, last 24 h labs and trends, and last 24 h imaging results.   LOS: 1 day    Delton Filbert, [facility] Surgery 01/28/2024, 12:35 PM Please see Amion for pager number during day hours 7:00am-4:30pm

## 2024-01-28 NOTE — TOC Initial Note (Signed)
 Transition of Care [facility]) - Initial/Assessment Note    Patient Details  Name: Jorge Butler MRN: [id number] Date of Birth: 07-30-1932  Transition of Care [facility]) CM/SW Contact:    Ruben Corolla, RN Phone Number: 01/28/2024, 2:02 PM  Clinical Narrative:  d/c plan home.                 Expected Discharge Plan: Home/Self Care Barriers to Discharge: Continued Medical Work up   Patient Goals and CMS Choice Patient states their goals for this hospitalization and ongoing recovery are:: Home CMS Medicare.gov Compare Post Acute Care list provided to:: Patient Choice offered to / list presented to : Patient [place] ownership interest in [facility].provided to:: Patient    Expected Discharge Plan and Services   Discharge Planning Services: CM Consult Post Acute Care Choice: Resumption of Svcs/PTA Provider Living arrangements for the past 2 months: Single Family Home                                      Prior Living Arrangements/Services Living arrangements for the past 2 months: Single Family Home Lives with:: Self              Current home services: DME (cane)    Activities of Daily Living   ADL Screening (condition at time of admission) Independently performs ADLs?: Yes (appropriate for developmental age) Is the patient deaf or have difficulty hearing?: No Does the patient have difficulty seeing, even when wearing glasses/contacts?: No Does the patient have difficulty concentrating, remembering, or making decisions?: No  Permission Sought/Granted                  Emotional Assessment              Admission diagnosis:  Urinary retention [R33.9] Perforated appendicitis [K35.32] Other acute appendicitis [K35.890] Patient Active Problem List   Diagnosis Date Noted   Sepsis (HCC) 01/28/2024   Abdominal pain 01/28/2024   DM2 (diabetes mellitus, type 2) (HCC) 01/28/2024   CKD stage 3b, GFR 30-44 ml/min (HCC) 01/28/2024   Perforated  appendicitis 01/27/2024   AKI (acute kidney injury) (HCC) 04/16/2023   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   Glaucoma 04/16/2023   Shuffling gait 04/16/2023   Hypertension 04/16/2023   Acquired hypothyroidism 04/16/2023   HLD (hyperlipidemia) 04/16/2023   Elevated PSA 04/16/2023   Type 2 diabetes mellitus with hyperglycemia (HCC) 04/16/2023   Cerebrovascular accident (CVA) (HCC) 04/16/2023   Hyperbilirubinemia 04/16/2023   Pseudohyponatremia 04/16/2023   Mild protein-calorie malnutrition (HCC) 04/16/2023   Hyperkalemia 04/16/2023   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 [place] [address] Phone: [phone] Fax: [phone]     Social Drivers of Health (SDOH) Social History: SDOH Screenings   Food Insecurity: No Food Insecurity (01/28/2024)  Housing: Low Risk  (01/28/2024)  Transportation Needs: No Transportation Needs (01/28/2024)  Utilities: Not At Risk (01/28/2024)  Social Connections: Moderately Integrated (01/28/2024)  Tobacco Use: Low Risk  (01/28/2024)   SDOH Interventions:     Readmission Risk Interventions     No data to display

## 2024-01-28 NOTE — Plan of Care (Signed)
  Problem: Education: Goal: Ability to describe self-care measures that may prevent or decrease complications (Diabetes Survival Skills Education) will improve Outcome: Progressing Goal: Individualized Educational Video(s) Outcome: Progressing   Problem: Coping: Goal: Ability to adjust to condition or change in health will improve Outcome: Progressing   Problem: Fluid Volume: Goal: Ability to maintain a balanced intake and output will improve Outcome: Progressing   Problem: Health Behavior/Discharge Planning: Goal: Ability to identify and utilize available resources and services will improve Outcome: Progressing Goal: Ability to manage health-related needs will improve Outcome: Progressing   Problem: Metabolic: Goal: Ability to maintain appropriate glucose levels will improve Outcome: Progressing   Problem: Nutritional: Goal: Maintenance of adequate nutrition will improve Outcome: Progressing Goal: Progress toward achieving an optimal weight will improve Outcome: Progressing   Problem: Skin Integrity: Goal: Risk for impaired skin integrity will decrease Outcome: Progressing   Problem: Tissue Perfusion: Goal: Adequacy of tissue perfusion will improve Outcome: Progressing   Problem: Education: Goal: Knowledge of General Education information will improve Description: Including pain rating scale, medication(s)/side effects and non-pharmacologic comfort measures Outcome: Progressing   Problem: Health Behavior/Discharge Planning: Goal: Ability to manage health-related needs will improve Outcome: Progressing   Problem: Clinical Measurements: Goal: Ability to maintain clinical measurements within normal limits will improve Outcome: Progressing Goal: Will remain free from infection Outcome: Progressing Goal: Diagnostic test results will improve Outcome: Progressing Goal: Respiratory complications will improve Outcome: Progressing Goal: Cardiovascular complication will  be avoided Outcome: Progressing   Problem: Activity: Goal: Risk for activity intolerance will decrease Outcome: Progressing   Problem: Nutrition: Goal: Adequate nutrition will be maintained Outcome: Progressing   Problem: Coping: Goal: Level of anxiety will decrease Outcome: Progressing   Problem: Elimination: Goal: Will not experience complications related to bowel motility Outcome: Progressing   Problem: Elimination: Goal: Will not experience complications related to bowel motility Outcome: Progressing Goal: Will not experience complications related to urinary retention Outcome: Progressing   Problem: Pain Managment: Goal: General experience of comfort will improve and/or be controlled Outcome: Progressing   Problem: Safety: Goal: Ability to remain free from injury will improve Outcome: Progressing   Problem: Skin Integrity: Goal: Risk for impaired skin integrity will decrease Outcome: Progressing

## 2024-01-28 NOTE — Progress Notes (Addendum)
 Progress Note   Patient: Jorge Butler VWU:[id number] DOB: Jan 24, 1932 DOA: 01/27/2024     1 DOS: the patient was seen and examined on 01/28/2024   Brief hospital course: 89yo with h/o DM, HLD, hypothyroidism, and stage 3B CKD (Cr 1.8-2.3 baseline) who presented on 5/6 with fever and RLQ pain.  CT with LLQ mass; surgery is recommending conservative management with CLD, IVF, and IV antibiotics.  Assessment and Plan:  RLQ mass Possible perforated appendicitis, although surgery is less convinced of this Could also be colon cancer, diverticulitis, other etiology Sepsis ruled out Blood cultures pending Continue Zosyn   DM A1c 7.0, good control Hold Glucophage Cover with moderate-scale SSI Carb modified diet   HLD Atorvastatin   Hypothyroidism Synthroid   Stage 3b CKD Appears to be stable at this time Attempt to avoid nephrotoxic medications Recheck BMP in AM   DNR I have discussed code status with the patient/family and they are in agreement that the patient would not desire resuscitation and would prefer to die a natural death should that situation arise. Patient will need a gold out of facility DNR form at the time of discharge      Consultants: Surgery PT OT  Procedures: None  Antibiotics: Zosyn  5/7-  30 Day Unplanned Readmission Risk Score    Flowsheet Row ED to Hosp-Admission (Current) from 01/27/2024 in [facility] 4TH FLOOR PROGRESSIVE CARE AND UROLOGY  30 Day Unplanned Readmission Risk Score (%) 13.91 Filed at 01/28/2024 0801       This score is the patient's risk of an unplanned readmission within 30 days of being discharged (0 -100%). The score is based on dignosis, age, lab data, medications, orders, and past utilization.   Low:  0-14.9   Medium: 15-21.9   High: 22-29.9   Extreme: 30 and above           Subjective: Feeling better today.  Had a similar episode about a year ago.  Wants to advance diet.  Would be unlikely to proceed with surgery if  recommended.  Wants to go home tomorrow.   Objective: Vitals:   01/28/24 0422 01/28/24 1326  BP: (!) 113/46 (!) 109/45  Pulse: 83 87  Resp: 16 20  Temp: 98.5 F (36.9 C) (!) 97.5 F (36.4 C)  SpO2: 97% 100%    Intake/Output Summary (Last 24 hours) at 01/28/2024 1623 Last data filed at 01/28/2024 1326 Gross per 24 hour  Intake 220 ml  Output 1120 ml  Net -900 ml   Filed Weights   01/27/24 1538 01/28/24 0427  Weight: 77.1 kg 78.2 kg    Exam:  General:  Appears calm and comfortable and is in NAD Eyes:  EOMI, normal lids, iris ENT:  grossly normal hearing, lips & tongue, mmm Cardiovascular:  RRR. No LE edema.  Respiratory:   CTA bilaterally with no wheezes/rales/rhonchi.  Normal respiratory effort. Abdomen:  soft, fullness in RLQ concerning for mass Skin:  no rash or induration seen on limited exam Musculoskeletal:  grossly normal tone BUE/BLE, good ROM, no bony abnormality Psychiatric:  grossly normal mood and affect, speech fluent and appropriate, AOx3 Neurologic:  CN 2-12 grossly intact, moves all extremities in coordinated fashion    Data Reviewed: I have reviewed the patient's lab results since admission.  Pertinent labs for today include:   Na++ 129 Glucose 178 BUN 45/Creatinine 2.01/GFR 31 WBC 23 Blood cultures pending    Family Communication: Godson and his aunt were present throughout; they report that they are his decision-makers and he  concurs  Disposition: Status is: Inpatient Remains inpatient appropriate because: ongoing monitoring     Time spent: 50 minutes  Unresulted Labs (From admission, onward)     Start     Ordered   01/29/24 0500  CBC  Tomorrow morning,   R        01/28/24 1257   01/29/24 0500  Basic metabolic panel with GFR  Tomorrow morning,   R        01/28/24 1621   01/28/24 0732  CEA  Once,   R        01/28/24 5284             Author: Lorita Rosa, MD 01/28/2024 4:23 PM  For on call review [email or web address].

## 2024-01-29 DIAGNOSIS — K3532 Acute appendicitis with perforation and localized peritonitis, without abscess: Secondary | ICD-10-CM | POA: Diagnosis not present

## 2024-01-29 LAB — BASIC METABOLIC PANEL WITH GFR
Anion gap: 9 (ref 5–15)
BUN: 43 mg/dL — ABNORMAL HIGH (ref 8–23)
CO2: 24 mmol/L (ref 22–32)
Calcium: 8.3 mg/dL — ABNORMAL LOW (ref 8.9–10.3)
Chloride: 99 mmol/L (ref 98–111)
Creatinine, Ser: 2.13 mg/dL — ABNORMAL HIGH (ref 0.61–1.24)
GFR, Estimated: 29 mL/min — ABNORMAL LOW (ref >60)
Glucose, Bld: 110 mg/dL — ABNORMAL HIGH (ref 70–99)
Potassium: 3.9 mmol/L (ref 3.5–5.1)
Sodium: 132 mmol/L — ABNORMAL LOW (ref 135–145)

## 2024-01-29 LAB — CBC
HCT: 35.9 % — ABNORMAL LOW (ref 39.0–52.0)
Hemoglobin: 11.4 g/dL — ABNORMAL LOW (ref 13.0–17.0)
MCH: 30 pg (ref 26.0–34.0)
MCHC: 31.8 g/dL (ref 30.0–36.0)
MCV: 94.5 fL (ref 80.0–100.0)
Platelets: 293 10*3/uL (ref 150–400)
RBC: 3.8 MIL/uL — ABNORMAL LOW (ref 4.22–5.81)
RDW: 13.7 % (ref 11.5–15.5)
WBC: 17.5 10*3/uL — ABNORMAL HIGH (ref 4.0–10.5)
nRBC: 0 % (ref 0.0–0.2)

## 2024-01-29 LAB — GLUCOSE, CAPILLARY
Glucose-Capillary: 116 mg/dL — ABNORMAL HIGH (ref 70–99)
Glucose-Capillary: 118 mg/dL — ABNORMAL HIGH (ref 70–99)
Glucose-Capillary: 261 mg/dL — ABNORMAL HIGH (ref 70–99)
Glucose-Capillary: 274 mg/dL — ABNORMAL HIGH (ref 70–99)

## 2024-01-29 LAB — CEA: CEA: 1.9 ng/mL (ref 0.0–4.7)

## 2024-01-29 MED ORDER — TAMSULOSIN HCL 0.4 MG PO CAPS
0.4000 mg | ORAL_CAPSULE | Freq: Every day | ORAL | Status: DC
  Administered 2024-01-29 – 2024-01-30 (×2): 0.4 mg via ORAL
  Filled 2024-01-29 (×2): qty 1

## 2024-01-29 NOTE — Evaluation (Signed)
 Occupational Therapy Evaluation Patient Details Name: Jorge Butler MRN: [id number] DOB: September 27, 1931 Today's Date: 01/29/2024   History of Present Illness   89 y.o. male adm 5/6 with medical history significant for type 2 diabetes mellitus, hyperlipidemia, acquired hypothyroidism, CKD 3B, who presented with sepsis due to suspected perforated appendicitis     Clinical Impressions Patient admitted for the diagnosis above.  PTA he lives alone, drives, completes his own ADL and day to day light iADL.  He has a family member that stops by once a month for deeper cleaning and to assist with his laundry.  Patient has a SPC, but does not use it.  He presents as very unsteady, and would benefit from an AD for stability.  He does not wish to go to SNF for rehab, so hopefully between mobility and rehab, he can return to his baseline.  HH OT will be recommended for now and will depend on progress.  OT will follow in the acute setting to address deficits.  Unfortunately he has a [place] RW in his room, which is too large for him.        If plan is discharge home, recommend the following:   Assist for transportation;Assistance with cooking/housework;A little help with bathing/dressing/bathroom;A little help with walking and/or transfers     Functional Status Assessment   Patient has had a recent decline in their functional status and demonstrates the ability to make significant improvements in function in a reasonable and predictable amount of time.     Equipment Recommendations   Tub/shower seat     Recommendations for Other Services         Precautions/Restrictions   Precautions Precautions: Fall Precaution/Restrictions Comments: foley Restrictions Weight Bearing Restrictions Per Provider Order: No     Mobility Bed Mobility Overal bed mobility: Needs Assistance Bed Mobility: Supine to Sit     Supine to sit: Min assist          Transfers Overall transfer level: Needs  assistance Equipment used: 1 person hand held assist Transfers: Sit to/from Stand, Bed to chair/wheelchair/BSC Sit to Stand: Min assist     Step pivot transfers: Min assist     General transfer comment: very unsteady      Balance Overall balance assessment: Needs assistance Sitting-balance support: Feet supported Sitting balance-Leahy Scale: Fair     Standing balance support: Single extremity supported Standing balance-Leahy Scale: Poor                             ADL either performed or assessed with clinical judgement   ADL       Grooming: Contact guard assist;Standing               Lower Body Dressing: Minimal assistance;Sit to/from stand   Toilet Transfer: Minimal assistance;Ambulation;Regular Toilet                   Vision Baseline Vision/History: 1 Wears glasses Patient Visual Report: No change from baseline       Perception Perception: Not tested       Praxis Praxis: Not tested       Pertinent Vitals/Pain Pain Assessment Pain Assessment: No/denies pain     Extremity/Trunk Assessment Upper Extremity Assessment Upper Extremity Assessment: Overall WFL for tasks assessed   Lower Extremity Assessment Lower Extremity Assessment: Defer to PT evaluation   Cervical / Trunk Assessment Cervical / Trunk Assessment: Normal   Communication Communication Communication: No  apparent difficulties   Cognition Arousal: Alert Behavior During Therapy: WFL for tasks assessed/performed Cognition: No apparent impairments                               Following commands: Intact       Cueing  General Comments          Exercises     Shoulder Instructions      Home Living Family/patient expects to be discharged to:: Private residence Living Arrangements: Alone Available Help at Discharge: Family;Available PRN/intermittently Type of Home: Apartment Home Access: Level entry     Home Layout: One level      Bathroom Shower/Tub: Chief Strategy Officer: Standard     Home Equipment: Cane - single point          Prior Functioning/Environment Prior Level of Function : Independent/Modified Independent;Driving                    OT Problem List: Impaired balance (sitting and/or standing)   OT Treatment/Interventions: Self-care/ADL training;Therapeutic activities;Balance training;DME and/or AE instruction      OT Goals(Current goals can be found in the care plan section)   Acute Rehab OT Goals Patient Stated Goal: Return home OT Goal Formulation: With patient Time For Goal Achievement: 02/12/24 Potential to Achieve Goals: Fair ADL Goals Pt Will Perform Grooming: with modified independence;standing Pt Will Perform Lower Body Dressing: with modified independence;sit to/from stand Pt Will Transfer to Toilet: with modified independence;regular height toilet;ambulating   OT Frequency:  Min 2X/week    Co-evaluation              AM-PAC OT "6 Clicks" Daily Activity     Outcome Measure Help from another person eating meals?: None Help from another person taking care of personal grooming?: A Little Help from another person toileting, which includes using toliet, bedpan, or urinal?: A Little Help from another person bathing (including washing, rinsing, drying)?: A Little Help from another person to put on and taking off regular upper body clothing?: None Help from another person to put on and taking off regular lower body clothing?: A Little 6 Click Score: 20   End of Session Nurse Communication: Mobility status  Activity Tolerance: Patient tolerated treatment well Patient left: in chair;with call bell/phone within reach  OT Visit Diagnosis: Unsteadiness on feet (R26.81);Muscle weakness (generalized) (M62.81)                Time: 1308-6578 OT Time Calculation (min): 18 min Charges:  OT General Charges $OT Visit: 1 Visit OT Evaluation $OT Eval Moderate  Complexity: 1 Mod  01/29/2024  RP, OTR/L  Acute Rehabilitation Services  Office:  [phone]   Jorge Butler 01/29/2024, 8:32 AM

## 2024-01-29 NOTE — Progress Notes (Addendum)
 Subjective: CC: Patient denies abdominal pain. He is tolerating CLD and denies nausea or vomiting. He does not think he is having flatus but he had a small BM today and yesterday.   Afebrile. No tachycardia or systolic hypotension. WBC 17 from 23.   Objective: Vital signs in last 24 hours: Temp:  [97.5 F (36.4 C)-98.4 F (36.9 C)] 98.4 F (36.9 C) (05/08 0512) Pulse Rate:  [71-87] 71 (05/08 0512) Resp:  [16-20] 16 (05/08 0512) BP: (97-117)/(45-57) 117/57 (05/08 0512) SpO2:  [97 %-100 %] 97 % (05/08 0512) Last BM Date : 01/25/24  Intake/Output from previous day: 05/07 0701 - 05/08 0700 In: 460 [P.O.:360; IV Piggyback:100] Out: 1050 [Urine:1050] Intake/Output this shift: No intake/output data recorded.  PE: Gen:  Alert, NAD, pleasant Abd: Soft, mild distension, RLQ with mild tenderness to deep palpation - no rigidity or guarding. +BS GU: foley in place draining clear, yellow urine Psych: A&Ox3   Lab Results:  Recent Labs    01/28/24 0011 01/29/24 0525  WBC 23.0* 17.5*  HGB 12.7* 11.4*  HCT 39.7 35.9*  PLT 298 293   BMET Recent Labs    01/28/24 0011 01/29/24 0525  NA 129* 132*  K 3.9 3.9  CL 96* 99  CO2 22 24  GLUCOSE 178* 110*  BUN 45* 43*  CREATININE 2.01* 2.13*  CALCIUM  8.9 8.3*   PT/INR Recent Labs    01/28/24 0011  LABPROT 14.9  INR 1.2   CMP     Component Value Date/Time   NA 132 (L) 01/29/2024 0525   K 3.9 01/29/2024 0525   CL 99 01/29/2024 0525   CO2 24 01/29/2024 0525   GLUCOSE 110 (H) 01/29/2024 0525   BUN 43 (H) 01/29/2024 0525   CREATININE 2.13 (H) 01/29/2024 0525   CALCIUM  8.3 (L) 01/29/2024 0525   PROT 7.2 01/28/2024 0011   ALBUMIN 3.1 (L) 01/28/2024 0011   AST 21 01/28/2024 0011   ALT 23 01/28/2024 0011   ALKPHOS 79 01/28/2024 0011   BILITOT 1.2 01/28/2024 0011   GFRNONAA 29 (L) 01/29/2024 0525   Lipase     Component Value Date/Time   LIPASE 28 04/16/2023 1315    Studies/Results: CT ABDOMEN PELVIS WO  CONTRAST Result Date: 01/27/2024 CLINICAL DATA:  History of pneumatosis with abdominal distension and right flank pain. EXAM: CT ABDOMEN AND PELVIS WITHOUT CONTRAST TECHNIQUE: Multidetector CT imaging of the abdomen and pelvis was performed following the standard protocol without IV contrast. RADIATION DOSE REDUCTION: This exam was performed according to the departmental dose-optimization program which includes automated exposure control, adjustment of the mA and/or kV according to patient size and/or use of iterative reconstruction technique. COMPARISON:  04/16/2023 FINDINGS: Mild motion degradation. EKG wires and leads cause artifact. Lack of oral or IV contrast. Lower chest: Bibasilar scarring. Heart size accentuated by a pectus excavatum deformity. Right coronary artery calcification. Hepatobiliary: Normal noncontrast appearance of the liver. Dependent gallstone without acute cholecystitis or biliary duct dilatation. Pancreas: Normal, without mass or ductal dilatation. Spleen: Normal in size, without focal abnormality. Adrenals/Urinary Tract: Normal adrenal glands. Renal cortical thinning is mild, within normal variation for age. Minimal left-sided caliectasis. Minimal bilateral ureteric prominence, likely due to bladder distension, which is moderate. Stomach/BOwel: Normal stomach, without wall thickening. Descending duodenal diverticulum or diverticula. Otherwise normal small bowel. Extensive colonic diverticulosis. Normal terminal ileum. About the cecum is inflammation, fluid and ill definition of fat planes. Example 55/2. Separate appendix not confidently identified. Vascular/Lymphatic: Aortic atherosclerosis.  No abdominopelvic adenopathy. Reproductive: Mild prostatomegaly. Other: Small fat containing right inguinal hernia. No significant free fluid. No free intraperitoneal air. Musculoskeletal: Degenerative disc disease at the lumbosacral junction. IMPRESSION: 1. Decrease sensitivity exam secondary to  motion, lack of oral/IV contrast, EKG wires and leads. 2. Edema, ill-defined fluid, centered about the cecum. If the patient has not had prior appendectomy, perforated appendicitis could have this appearance. Alternate etiologies include colitis, diverticulitis, or perforated colon cancer. Consider surgical consultation. Oral and IV contrast enhanced exam may be helpful. 3. Cholelithiasis 4. Incidental findings, including: Coronary artery atherosclerosis. Aortic Atherosclerosis (ICD10-I70.0). 5. Prostatomegaly with bladder distension. Minimal left caliectasis and hydroureter are felt to be secondary. Electronically Signed   By: Lore Rode M.D.   On: 01/27/2024 18:27   DG Chest 2 View Result Date: 01/27/2024 CLINICAL DATA:  Fatigue EXAM: CHEST - 2 VIEW COMPARISON:  04/16/2023 FINDINGS: Heart and mediastinal contours are within normal limits. No focal opacities or effusions. No acute bony abnormality. Stable hyperinflation. IMPRESSION: Hyperinflation.  No active cardiopulmonary disease. Electronically Signed   By: Janeece Mechanic M.D.   On: 01/27/2024 17:31    Anti-infectives: Anti-infectives (From admission, onward)    Start     Dose/Rate Route Frequency Ordered Stop   01/28/24 0000  piperacillin -tazobactam (ZOSYN ) IVPB 3.375 g        3.375 g 12.5 mL/hr over 240 Minutes Intravenous Every 8 hours 01/27/24 2240     01/27/24 2130  piperacillin -tazobactam (ZOSYN ) IVPB 3.375 g  Status:  Discontinued        3.375 g 100 mL/hr over 30 Minutes Intravenous  Once 01/27/24 2122 01/27/24 2342        Assessment/Plan RLQ abdominal pain - CT findings noted.  Unclear if this is from colitis, diverticulitis, perforated appendicitis or even potentially a colon cancer. -  Last colonoscopy on file 2012 w/ no evidence of colon polyps and no significant diverticular disease. Family hx of colon ca in his brother pre chart review. CEA pending.  - No current indication for emergency surgery.  He is hemodynamically  stable without fever, tachycardia or systolic hypotension.  No peritonitis on exam. - Continue IV antibiotics - due to improvement in RLQ pain and WBC, and no clinical evidence of obstruction, will advance to FLD. - Daily labs - May need colonoscopy at some point to help further evaluate  - We will follow closely during admission -today he is showing clinical improvement.  Dr. Ramiro Burly had a long discussion with patient/family 5/7 (see her note) and today 5/8 the patient confirms that his goal is to avoid an operation unless it is necessary to save his life. He expresses understanding that his symptoms/CT findings could be due to infection or cancer along with the risk of bowel obstruction, perforation, and life-threatening sepsis.   FEN - FLD, IVF per primary  VTE - SCDs, okay for chem ppx from a general surgery standpoint ID - Zosyn . Afebrile. WBC 17  I reviewed nursing notes, hospitalist notes, last 24 h vitals and pain scores, last 48 h intake and output, last 24 h labs and trends, and last 24 h imaging results.   LOS: 2 days    Charlott Converse, [facility] 01/29/2024, 9:25 AM Please see Amion for pager number during day hours 7:00am-4:30pm

## 2024-01-29 NOTE — Plan of Care (Signed)
   Problem: Education: Goal: Ability to describe self-care measures that may prevent or decrease complications (Diabetes Survival Skills Education) will improve Outcome: Progressing Goal: Individualized Educational Video(s) Outcome: Progressing   Problem: Coping: Goal: Ability to adjust to condition or change in health will improve Outcome: Progressing

## 2024-01-29 NOTE — Plan of Care (Signed)
 Plan of care reviewed. Pt is alert and oriented x 4. He has been stable hemodynamically, afebrile, normal respiratory effort. No obvious acute distress overnight, denies abdominal pain, able to ambulate with one assistance. No major complaints. We will continue to monitor.  Problem: Coping: Goal: Ability to adjust to condition or change in health will improve Outcome: Progressing   Problem: Health Behavior/Discharge Planning: Goal: Ability to identify and utilize available resources and services will improve Outcome: Progressing Goal: Ability to manage health-related needs will improve Outcome: Progressing   Problem: Metabolic: Goal: Ability to maintain appropriate glucose levels will improve Outcome: Progressing   Problem: Nutritional: Goal: Maintenance of adequate nutrition will improve Outcome: Progressing Goal: Progress toward achieving an optimal weight will improve Outcome: Progressing   Problem: Clinical Measurements: Goal: Ability to maintain clinical measurements within normal limits will improve  Outcome: Progressing Goal: Will remain free from infection Outcome: Progressing Goal: Diagnostic test results will improve Outcome: Progressing Goal: Respiratory complications will improve Outcome: Progressing Goal: Cardiovascular complication will be avoided Outcome: Progressing   Problem: Elimination: Goal: Will not experience complications related to bowel motility Outcome: Progressing Goal: Will not experience complications related to urinary retention Outcome: Progressing   Problem: Pain Managment: Goal: General experience of comfort will improve and/or be controlled Outcome: Progressing   Brain Cahill, RN

## 2024-01-29 NOTE — Progress Notes (Signed)
 Progress Note   Jorge Butler: Jorge Butler XBJ:[id number] DOB: July 29, 1932 DOA: 01/27/2024     2 DOS: Jorge Butler was seen and examined on 01/29/2024   Brief hospital course: 89yo with h/o DM, HLD, hypothyroidism, and stage 3B CKD (Cr 1.8-2.3 baseline) who presented on 5/6 with fever and RLQ pain.  CT with LLQ mass; surgery is recommending conservative management with CLD, IVF, and IV antibiotics.  Urinary retention on presentation, undergoing voiding trial with tamsulosin  added.  Assessment and Plan:  RLQ mass Possible perforated appendicitis, although surgery is less convinced of this Could also be colon cancer, diverticulitis, other etiology Sepsis ruled out Blood cultures negative x 1 day Continue Zosyn  -> Augmentin x 7 days Tolerated a regular diet and wants to go home today Referred to GI for outpatient colonoscopy Anticipate dc on 5/9 after voiding trial   DM A1c 7.0, good control Resume Glucophage Carb modified diet    HLD Continue Atorvastatin    Hypothyroidism Continue Synthroid    Stage 3b CKD Appears to be stable at this time, progressing towards stage 4 Attempt to avoid nephrotoxic medications   Urinary retention Foley placed on admission Will do voiding trial - remove foley, encourage urination, and bladder scan post-void Start tamsulosin    DNR I have discussed code status with Jorge Butler/family and they are in agreement that Jorge Butler would not desire resuscitation and would prefer to die a natural death should that situation arise.           Consultants: Surgery PT OT   Procedures: None   Antibiotics: Zosyn  5/7-8 Augmentin 5/8-13  30 Day Unplanned Readmission Risk Score    Flowsheet Row ED to Hosp-Admission (Current) from 01/27/2024 in [place] 4TH FLOOR PROGRESSIVE CARE AND UROLOGY  30 Day Unplanned Readmission Risk Score (%) 16.86 Filed at 01/29/2024 1600       This score is Jorge Butler's risk of an unplanned readmission within 30  days of being discharged (0 -100%). Jorge score is based on dignosis, age, lab data, medications, orders, and past utilization.   Low:  0-14.9   Medium: 15-21.9   High: 22-29.9   Extreme: 30 and above         Recommendations at discharge:   Complete antibiotics (Augmentin twice daily through 5/13) Follow up with surgery on 5/30 Follow up with Dr. Mamie Searles in 1-2 weeks You are being referred back to [facility] for possible colonoscopy Take tamsulosin  (Flomax ) daily and follow up with Dr. Sherrine Dolly    Subjective: Feels good, eating very well. Foley removed, hasn't voided yet.  Jorge Butler is willing to stay another night to continue to monitor.   Objective: Vitals:   01/29/24 0512 01/29/24 1214  BP: (!) 117/57 (!) 121/46  Pulse: 71 74  Resp: 16 20  Temp: 98.4 F (36.9 C) 97.7 F (36.5 C)  SpO2: 97% 100%    Intake/Output Summary (Last 24 hours) at 01/29/2024 1750 Last data filed at 01/29/2024 1400 Gross per 24 hour  Intake 1109.94 ml  Output 1050 ml  Net 59.94 ml   Filed Weights   01/27/24 1538 01/28/24 0427  Weight: 77.1 kg 78.2 kg    Exam:  General:  Appears calm and comfortable and is in NAD Eyes:  EOMI, normal lids, iris ENT:  grossly normal hearing, lips & tongue, mmm Cardiovascular:  RRR. No LE edema.  Respiratory:   CTA bilaterally with no wheezes/rales/rhonchi.  Normal respiratory effort. Abdomen:  soft, fullness in RLQ concerning for mass Skin:  no rash  or induration seen on limited exam Musculoskeletal:  grossly normal tone BUE/BLE, good ROM, no bony abnormality Psychiatric:  grossly normal mood and affect, speech fluent and appropriate, AOx3 Neurologic:  CN 2-12 grossly intact, moves all extremities in coordinated fashion  Data Reviewed: I have reviewed Jorge Butler's lab results since admission.  Pertinent labs for today include:   Na++ 132, improved Glucose 110 BUN 43/Creatinine 2.13/GFR 29, stable WBC 17.5, down from 23 Hgb 11.4, down from 12.7     Family  Communication: NOK present throughout evaluation  Disposition: Status is: Inpatient Remains inpatient appropriate because: voiding trial     Time spent: 50 minutes  Unresulted Labs (From admission, onward)     Start     Ordered   01/28/24 0732  CEA  Once,   R        01/28/24 0731   Unscheduled  CBC with Differential/Platelet  Tomorrow morning,   R        01/29/24 1750   Unscheduled  Basic metabolic panel with GFR  Tomorrow morning,   R        01/29/24 1750             Author: Lorita Rosa, MD 01/29/2024 5:50 PM  For on call review [email or web address].

## 2024-01-29 NOTE — Evaluation (Signed)
 Physical Therapy Evaluation Patient Details Name: Jorge Butler MRN: [id number] DOB: [birth year] Today's Date: 01/29/2024  History of Present Illness  Jorge Butler is a 89 y.o. male with medical history significant for type 2 diabetes mellitus, hyperlipidemia, acquired hypothyroidism, CKD 3B associated baseline creatinine 1.8-2.3, who is admitted to [facility] on 01/27/2024 with sepsis due to suspected perforated appendicitis after presenting from home to [facility] ED complaining of subjective fever  Clinical Impression  Pt admitted with above diagnosis. Pt in recliner when PT arrives. Hx taken and pt lives alone in [address] level entry apt. He has assistance from godson 1x/mo but reports that he can "come anytime" to assist. Pt PLOF ind and driving without AD use at home/community. Pt incontinent of bowel, able to amb to bathroom to finish and self cleanses. Pt able to stand at sink and wash hands without HHA or external support. Amb x115 ft with 2WW. Pt would benefit from this device at dc. Pt currently with functional limitations due to the deficits listed below (see PT Problem List). Pt will benefit from acute skilled PT to increase their independence and safety with mobility to allow discharge.           If plan is discharge home, recommend the following: A little help with walking and/or transfers;A little help with bathing/dressing/bathroom;Assistance with cooking/housework;Assist for transportation   Can travel by private vehicle        Equipment Recommendations Rolling walker (2 wheels)  Recommendations for Other Services       Functional Status Assessment Patient has had a recent decline in their functional status and demonstrates the ability to make significant improvements in function in a reasonable and predictable amount of time.     Precautions / Restrictions Precautions Precautions: Fall Precaution/Restrictions Comments: foley Restrictions Weight Bearing  Restrictions Per Provider Order: No      Mobility  Bed Mobility Overal bed mobility: Needs Assistance             General bed mobility comments: see OT eval-pt in recliner when PT arrives Patient Response: Cooperative  Transfers Overall transfer level: Needs assistance Equipment used: Rolling walker (2 wheels) Transfers: Sit to/from Stand Sit to Stand: Supervision                Ambulation/Gait Ambulation/Gait assistance: Contact guard assist Gait Distance (Feet): 115 Feet Assistive device: Rolling walker (2 wheels) Gait Pattern/deviations: Step-through pattern, Decreased stride length, Wide base of support, Trunk flexed Gait velocity: dec     General Gait Details: dec stride length, equal bilaterally, cues for navigation and body position relative to walker  Stairs            Wheelchair Mobility     Tilt Bed Tilt Bed Patient Response: Cooperative  Modified Rankin (Stroke Patients Only)       Balance Overall balance assessment: Needs assistance Sitting-balance support: Feet supported, No upper extremity supported Sitting balance-Leahy Scale: Fair     Standing balance support: No upper extremity supported, During functional activity Standing balance-Leahy Scale: Fair Standing balance comment: pt able to stand at sink and wash hands, reahces for soap dispenser                             Pertinent Vitals/Pain Pain Assessment Pain Assessment: No/denies pain    Home Living Family/patient expects to be discharged to:: Private residence Living Arrangements: Alone Available Help at Discharge: Family;Available PRN/intermittently Type of Home:  Apartment Home Access: Level entry       Home Layout: One level Home Equipment: Cane - single point      Prior Function Prior Level of Function : Independent/Modified Independent;Driving                     Extremity/Trunk Assessment   Upper Extremity Assessment Upper  Extremity Assessment: Defer to OT evaluation    Lower Extremity Assessment Lower Extremity Assessment: Generalized weakness    Cervical / Trunk Assessment Cervical / Trunk Assessment: Kyphotic  Communication   Communication Communication: No apparent difficulties    Cognition Arousal: Alert Behavior During Therapy: WFL for tasks assessed/performed   PT - Cognitive impairments: No apparent impairments                         Following commands: Intact       Cueing Cueing Techniques: Verbal cues, Gestural cues, Tactile cues     General Comments General comments (skin integrity, edema, etc.): incontinent of bowel    Exercises     Assessment/Plan    PT Assessment Patient needs continued PT services  PT Problem List Decreased strength;Decreased activity tolerance;Decreased mobility;Decreased balance       PT Treatment Interventions DME instruction;Functional mobility training;Balance training;Patient/family education;Gait training;Therapeutic activities;Therapeutic exercise    PT Goals (Current goals can be found in the Care Plan section)  Acute Rehab PT Goals Patient Stated Goal: improve strength and balance PT Goal Formulation: With patient Time For Goal Achievement: 02/12/24 Potential to Achieve Goals: Good    Frequency Min 3X/week     Co-evaluation               AM-PAC PT "6 Clicks" Mobility  Outcome Measure Help needed turning from your back to your side while in a flat bed without using bedrails?: A Little Help needed moving from lying on your back to sitting on the side of a flat bed without using bedrails?: A Little Help needed moving to and from a bed to a chair (including a wheelchair)?: A Little Help needed standing up from a chair using your arms (e.g., wheelchair or bedside chair)?: A Little Help needed to walk in hospital room?: A Little Help needed climbing 3-5 steps with a railing? : A Lot 6 Click Score: 17    End of Session  Equipment Utilized During Treatment: Gait belt Activity Tolerance: Patient tolerated treatment well Patient left: in chair;with call bell/phone within reach;with chair alarm set Nurse Communication: Mobility status PT Visit Diagnosis: Unsteadiness on feet (R26.81);Muscle weakness (generalized) (M62.81)    Time: 1610-9604 PT Time Calculation (min) (ACUTE ONLY): 30 min   Charges:   PT Evaluation $PT Eval Low Complexity: 1 Low PT Treatments $Gait Training: 8-22 mins PT General Charges $$ ACUTE PT VISIT: 1 Visit         Darien Eden, PT Acute Rehabilitation Services Office: [phone] 01/29/2024   Serafin Dames 01/29/2024, 9:59 AM

## 2024-01-30 DIAGNOSIS — K3532 Acute appendicitis with perforation and localized peritonitis, without abscess: Secondary | ICD-10-CM | POA: Diagnosis not present

## 2024-01-30 LAB — CBC WITH DIFFERENTIAL/PLATELET
Abs Immature Granulocytes: 0.29 10*3/uL — ABNORMAL HIGH (ref 0.00–0.07)
Basophils Absolute: 0.1 10*3/uL (ref 0.0–0.1)
Basophils Relative: 0 %
Eosinophils Absolute: 0.5 10*3/uL (ref 0.0–0.5)
Eosinophils Relative: 3 %
HCT: 35.9 % — ABNORMAL LOW (ref 39.0–52.0)
Hemoglobin: 11.3 g/dL — ABNORMAL LOW (ref 13.0–17.0)
Immature Granulocytes: 2 %
Lymphocytes Relative: 16 %
Lymphs Abs: 2.2 10*3/uL (ref 0.7–4.0)
MCH: 29.8 pg (ref 26.0–34.0)
MCHC: 31.5 g/dL (ref 30.0–36.0)
MCV: 94.7 fL (ref 80.0–100.0)
Monocytes Absolute: 1.3 10*3/uL — ABNORMAL HIGH (ref 0.1–1.0)
Monocytes Relative: 10 %
Neutro Abs: 9.4 10*3/uL — ABNORMAL HIGH (ref 1.7–7.7)
Neutrophils Relative %: 69 %
Platelets: 299 10*3/uL (ref 150–400)
RBC: 3.79 MIL/uL — ABNORMAL LOW (ref 4.22–5.81)
RDW: 13.8 % (ref 11.5–15.5)
WBC: 13.8 10*3/uL — ABNORMAL HIGH (ref 4.0–10.5)
nRBC: 0 % (ref 0.0–0.2)

## 2024-01-30 LAB — BASIC METABOLIC PANEL WITH GFR
Anion gap: 7 (ref 5–15)
Anion gap: 7 (ref 5–15)
BUN: 45 mg/dL — ABNORMAL HIGH (ref 8–23)
BUN: 44 mg/dL — ABNORMAL HIGH (ref 8–23)
CO2: 23 mmol/L (ref 22–32)
CO2: 24 mmol/L (ref 22–32)
Calcium: 7.9 mg/dL — ABNORMAL LOW (ref 8.9–10.3)
Calcium: 7.8 mg/dL — ABNORMAL LOW (ref 8.9–10.3)
Chloride: 100 mmol/L (ref 98–111)
Chloride: 100 mmol/L (ref 98–111)
Creatinine, Ser: 2.41 mg/dL — ABNORMAL HIGH (ref 0.61–1.24)
Creatinine, Ser: 2.28 mg/dL — ABNORMAL HIGH (ref 0.61–1.24)
GFR, Estimated: 25 mL/min — ABNORMAL LOW (ref >60)
GFR, Estimated: 27 mL/min — ABNORMAL LOW (ref >60)
Glucose, Bld: 113 mg/dL — ABNORMAL HIGH (ref 70–99)
Glucose, Bld: 288 mg/dL — ABNORMAL HIGH (ref 70–99)
Potassium: 4 mmol/L (ref 3.5–5.1)
Potassium: 3.9 mmol/L (ref 3.5–5.1)
Sodium: 130 mmol/L — ABNORMAL LOW (ref 135–145)
Sodium: 131 mmol/L — ABNORMAL LOW (ref 135–145)

## 2024-01-30 LAB — GLUCOSE, CAPILLARY
Glucose-Capillary: 110 mg/dL — ABNORMAL HIGH (ref 70–99)
Glucose-Capillary: 273 mg/dL — ABNORMAL HIGH (ref 70–99)
Glucose-Capillary: 323 mg/dL — ABNORMAL HIGH (ref 70–99)

## 2024-01-30 MED ORDER — AMOXICILLIN-POT CLAVULANATE 875-125 MG PO TABS
1.0000 | ORAL_TABLET | Freq: Two times a day (BID) | ORAL | 0 refills | Status: DC
Start: 2024-01-30 — End: 2024-01-30

## 2024-01-30 MED ORDER — LACTATED RINGERS IV BOLUS
500.0000 mL | Freq: Once | INTRAVENOUS | Status: AC
Start: 2024-01-30 — End: 2024-01-30
  Administered 2024-01-30: 500 mL via INTRAVENOUS

## 2024-01-30 MED ORDER — AMOXICILLIN-POT CLAVULANATE 875-125 MG PO TABS: 1.0000 | ORAL_TABLET | Freq: Two times a day (BID) | ORAL | 0 refills | Status: AC

## 2024-01-30 NOTE — Progress Notes (Signed)
 Mobility Specialist - Progress Note   01/30/24 1504  Mobility  Activity Ambulated with assistance in hallway  Level of Assistance Minimal assist, patient does 75% or more  Assistive Device Front wheel walker  Distance Ambulated (ft) 150 ft  Range of Motion/Exercises Active  Activity Response Tolerated well  Mobility Referral Yes  Mobility visit 1 Mobility  Mobility Specialist Start Time (ACUTE ONLY) 1445  Mobility Specialist Stop Time (ACUTE ONLY) 1500  Mobility Specialist Time Calculation (min) (ACUTE ONLY) 15 min   Pt was found in bed and agreeable to ambulate. No complaints with session and returned to recliner chair with all needs met. Call bell in reach and chair alarm on. RN in room.  Lorna Rose Mobility Specialist

## 2024-01-30 NOTE — Discharge Summary (Addendum)
 Physician Discharge Summary   Patient: Jorge Butler MRN: [id number] DOB: September 14, 1932  Admit date:     01/27/2024  Discharge date: 01/30/24  Discharge Physician: Lorita Rosa   PCP: Margarete Sharps, MD   Recommendations at discharge:   You are being discharged with home health physical and occupational therapy Complete antibiotics (Augmentin twice daily through 5/13) Follow up with surgery on 5/30 Follow up with Dr. Mamie Searles in 1-2 weeks You are being referred back to [facility] GI for possible colonoscopy; call for appointment Take tamsulosin  and finasteride  and follow up with Dr. Sherrine Dolly regarding another voiding trial; call for appointment Stop glipizide , discuss alternative diabetes medications with PCP  Discharge Diagnoses: Principal Problem:   Perforated appendicitis Active Problems:   Acquired hypothyroidism   HLD (hyperlipidemia)   Sepsis (HCC)   Abdominal pain   DM2 (diabetes mellitus, type 2) (HCC)   CKD stage 3b, GFR 30-44 ml/min [facility])    Hospital Course: 89yo with h/o DM, HLD, hypothyroidism, and stage 3B CKD (Cr 1.8-2.3 baseline) who presented on 5/6 with fever and RLQ pain.  CT with LLQ mass; surgery is recommending conservative management with CLD, IVF, and IV antibiotics.  Urinary retention on presentation, undergoing voiding trial with tamsulosin  added.  Assessment and Plan:  RLQ mass Possible perforated appendicitis, although surgery is less convinced of this Could also be colon cancer, diverticulitis, other etiology Sepsis ruled out Blood cultures negative x 1 day Continue Zosyn  -> Augmentin x 7 days Tolerated a regular diet and wants to go home today Referred to GI for outpatient colonoscopy DC on 5/9 after voiding trial   DM A1c 7.0, good control Resume sitagliptin Carb modified diet Stop taking glipizide  and discuss alternative therapies with your PCP    HLD Continue Atorvastatin    Hypothyroidism Continue Synthroid    Stage 3b CKD Appears to be  stable at this time, progressing towards stage 4 Mild AKI this AM which was likely related to urinary retention overnight Foley replaced and f/u BMP shows improvement Attempt to avoid nephrotoxic medications   Urinary retention Foley placed on admission Failed voiding trial so it was replaced Continue tamsulosin  and finasteride  Will need f/u with urology   DNR I have discussed code status with the patient/family and they are in agreement that the patient would not desire resuscitation and would prefer to die a natural death should that situation arise.           Consultants: Surgery PT OT   Procedures: None   Antibiotics: Zosyn  5/7-8 Augmentin 5/8-13    Pain control - [place]  Controlled Substance Reporting System database was reviewed. and patient was instructed, not to drive, operate heavy machinery, perform activities at heights, swimming or participation in water activities or provide baby-sitting services while on Pain, Sleep and Anxiety Medications; until their outpatient Physician has advised to do so again. Also recommended to not to take more than prescribed Pain, Sleep and Anxiety Medications.   Disposition: Home Diet recommendation:  Carb modified diet DISCHARGE MEDICATION: Allergies as of 01/30/2024       Reactions   Sulfa Antibiotics Nausea And Vomiting        Medication List     STOP taking these medications    glipiZIDE  5 MG tablet Commonly known as: GLUCOTROL        TAKE these medications    amoxicillin-clavulanate 875-125 MG tablet Commonly known as: AUGMENTIN Take 1 tablet by mouth 2 (two) times daily for 4 days.   aspirin  EC 81 MG tablet  Take 81 mg by mouth daily.   atorvastatin  20 MG tablet Commonly known as: LIPITOR Take 10 mg by mouth daily.   cyanocobalamin 500 MCG tablet Commonly known as: VITAMIN B12 Take 500 mcg by mouth daily.   finasteride  5 MG tablet Commonly known as: PROSCAR  Take 5 mg by mouth daily.    multivitamin with minerals Tabs tablet Take 1 tablet by mouth daily.   omega-3 acid ethyl esters 1 g capsule Commonly known as: LOVAZA Take 1 g by mouth daily.   polyethylene glycol 17 g packet Commonly known as: MIRALAX  / GLYCOLAX  Take 17 g by mouth daily. What changed:  when to take this reasons to take this   SITagliptin 25 MG Tabs Take 1 tablet by mouth daily.   Synthroid  88 MCG tablet Generic drug: levothyroxine  Take 88 mcg by mouth daily before breakfast.   tamsulosin  0.4 MG Caps capsule Commonly known as: FLOMAX  Take 0.4 mg by mouth daily.   Testosterone  Cypionate 200 MG/ML Sosy Inject 200 mg into the muscle every 21 ( twenty-one) days.               Durable Medical Equipment  (From admission, onward)           Start     Ordered   01/30/24 1311  For home use only DME Walker rolling  Once       Question Answer Comment  Walker: With 5 Inch Wheels   Patient needs a walker to treat with the following condition Unsteady gait      01/30/24 1310            Follow-up Information     Edmon Gosling, MD Follow up on 02/20/2024.   Specialty: General Surgery Why: 945am. Please arrive 30 minutes prior to your appointment for paperwork. Please bring a copy of your photo ID and insurance card. We would like you to get a colonoscopy in ~6-8 weeks. Contact information: [address] [place] [address] [phone]         [facility] Follow up.   Specialty: Home Health Services Why: [facility] physical therapy Contact information: [address] [place] [address] [phone]         [facility] Follow up.   Why: rolling walker Contact information: [address] [place]               Discharge Exam:   Subjective: Feeling ok today.  Having small BMs.  Eating very well.  Feels good about going home.   Objective: Vitals:   01/30/24 1349 01/30/24 1545  BP: (!) 130/54   Pulse: 71   Resp: 16    Temp: (!) 97.3 F (36.3 C) 97.6 F (36.4 C)  SpO2: 99%     Intake/Output Summary (Last 24 hours) at 01/30/2024 1811 Last data filed at 01/30/2024 1729 Gross per 24 hour  Intake 660.02 ml  Output 2000 ml  Net -1339.98 ml   Filed Weights   01/27/24 1538 01/28/24 0427 01/30/24 0607  Weight: 77.1 kg 78.2 kg 75.2 kg    Exam:  General:  Appears calm and comfortable and is in NAD Eyes:  EOMI, normal lids, iris ENT:  grossly normal hearing, lips & tongue, mmm Cardiovascular:  RRR. No LE edema.  Respiratory:   CTA bilaterally with no wheezes/rales/rhonchi.  Normal respiratory effort. Abdomen:  soft, fullness in RLQ concerning for mass Skin:  no rash or induration seen on limited exam Musculoskeletal:  grossly  normal tone BUE/BLE, good ROM, no bony abnormality Psychiatric:  grossly normal mood and affect, speech fluent and appropriate, AOx3 Neurologic:  CN 2-12 grossly intact, moves all extremities in coordinated fashion  Data Reviewed: I have reviewed the patient's lab results since admission.  Pertinent labs for today include:   Na++ 130 Glucose 113 BUN 45/Creatinine 2.41/GFR 25 WBC 13.8, improving Hgb 11.3, down from 12.7 on 5/7    Condition at discharge: stable  The results of significant diagnostics from this hospitalization (including imaging, microbiology, ancillary and laboratory) are listed below for reference.   Imaging Studies: CT ABDOMEN PELVIS WO CONTRAST Result Date: 01/27/2024 CLINICAL DATA:  History of pneumatosis with abdominal distension and right flank pain. EXAM: CT ABDOMEN AND PELVIS WITHOUT CONTRAST TECHNIQUE: Multidetector CT imaging of the abdomen and pelvis was performed following the standard protocol without IV contrast. RADIATION DOSE REDUCTION: This exam was performed according to the departmental dose-optimization program which includes automated exposure control, adjustment of the mA and/or kV according to patient size and/or use of iterative  reconstruction technique. COMPARISON:  04/16/2023 FINDINGS: Mild motion degradation. EKG wires and leads cause artifact. Lack of oral or IV contrast. Lower chest: Bibasilar scarring. Heart size accentuated by a pectus excavatum deformity. Right coronary artery calcification. Hepatobiliary: Normal noncontrast appearance of the liver. Dependent gallstone without acute cholecystitis or biliary duct dilatation. Pancreas: Normal, without mass or ductal dilatation. Spleen: Normal in size, without focal abnormality. Adrenals/Urinary Tract: Normal adrenal glands. Renal cortical thinning is mild, within normal variation for age. Minimal left-sided caliectasis. Minimal bilateral ureteric prominence, likely due to bladder distension, which is moderate. Stomach/BOwel: Normal stomach, without wall thickening. Descending duodenal diverticulum or diverticula. Otherwise normal small bowel. Extensive colonic diverticulosis. Normal terminal ileum. About the cecum is inflammation, fluid and ill definition of fat planes. Example 55/2. Separate appendix not confidently identified. Vascular/Lymphatic: Aortic atherosclerosis. No abdominopelvic adenopathy. Reproductive: Mild prostatomegaly. Other: Small fat containing right inguinal hernia. No significant free fluid. No free intraperitoneal air. Musculoskeletal: Degenerative disc disease at the lumbosacral junction. IMPRESSION: 1. Decrease sensitivity exam secondary to motion, lack of oral/IV contrast, EKG wires and leads. 2. Edema, ill-defined fluid, centered about the cecum. If the patient has not had prior appendectomy, perforated appendicitis could have this appearance. Alternate etiologies include colitis, diverticulitis, or perforated colon cancer. Consider surgical consultation. Oral and IV contrast enhanced exam may be helpful. 3. Cholelithiasis 4. Incidental findings, including: Coronary artery atherosclerosis. Aortic Atherosclerosis (ICD10-I70.0). 5. Prostatomegaly with bladder  distension. Minimal left caliectasis and hydroureter are felt to be secondary. Electronically Signed   By: Lore Rode M.D.   On: 01/27/2024 18:27   DG Chest 2 View Result Date: 01/27/2024 CLINICAL DATA:  Fatigue EXAM: CHEST - 2 VIEW COMPARISON:  04/16/2023 FINDINGS: Heart and mediastinal contours are within normal limits. No focal opacities or effusions. No acute bony abnormality. Stable hyperinflation. IMPRESSION: Hyperinflation.  No active cardiopulmonary disease. Electronically Signed   By: Janeece Mechanic M.D.   On: 01/27/2024 17:31    Microbiology: Results for orders placed or performed during the hospital encounter of 01/27/24  Resp panel by RT-PCR (RSV, Flu A&B, Covid) Anterior Nasal Swab     Status: None   Collection Time: 01/27/24  4:11 PM   Specimen: Anterior Nasal Swab  Result Value Ref Range Status   SARS Coronavirus 2 by RT PCR NEGATIVE NEGATIVE Final    Comment: (NOTE) SARS-CoV-2 target nucleic acids are NOT DETECTED.  The SARS-CoV-2 RNA is generally detectable in upper respiratory specimens  during the acute phase of infection. The lowest concentration of SARS-CoV-2 viral copies this assay can detect is 138 copies/mL. A negative result does not preclude SARS-Cov-2 infection and should not be used as the sole basis for treatment or other patient management decisions. A negative result may occur with  improper specimen collection/handling, submission of specimen other than nasopharyngeal swab, presence of viral mutation(s) within the areas targeted by this assay, and inadequate number of viral copies(<138 copies/mL). A negative result must be combined with clinical observations, patient history, and epidemiological information. The expected result is Negative.  Fact Sheet for Patients:  BloggerCourse.com  Fact Sheet for Healthcare Providers:  SeriousBroker.it  This test is no t yet approved or cleared by the [place]   FDA and  has been authorized for detection and/or diagnosis of SARS-CoV-2 by FDA under an Emergency Use Authorization (EUA). This EUA will remain  in effect (meaning this test can be used) for the duration of the COVID-19 declaration under Section 564(b)(1) of the Act, 21 U.S.C.section 360bbb-3(b)(1), unless the authorization is terminated  or revoked sooner.       Influenza A by PCR NEGATIVE NEGATIVE Final   Influenza B by PCR NEGATIVE NEGATIVE Final    Comment: (NOTE) The Xpert Xpress SARS-CoV-2/FLU/RSV plus assay is intended as an aid in the diagnosis of influenza from Nasopharyngeal swab specimens and should not be used as a sole basis for treatment. Nasal washings and aspirates are unacceptable for Xpert Xpress SARS-CoV-2/FLU/RSV testing.  Fact Sheet for Patients: BloggerCourse.com  Fact Sheet for Healthcare Providers: SeriousBroker.it  This test is not yet approved or cleared by the [place]  FDA and has been authorized for detection and/or diagnosis of SARS-CoV-2 by FDA under an Emergency Use Authorization (EUA). This EUA will remain in effect (meaning this test can be used) for the duration of the COVID-19 declaration under Section 564(b)(1) of the Act, 21 U.S.C. section 360bbb-3(b)(1), unless the authorization is terminated or revoked.     Resp Syncytial Virus by PCR NEGATIVE NEGATIVE Final    Comment: (NOTE) Fact Sheet for Patients: BloggerCourse.com  Fact Sheet for Healthcare Providers: SeriousBroker.it  This test is not yet approved or cleared by the [place]  FDA and has been authorized for detection and/or diagnosis of SARS-CoV-2 by FDA under an Emergency Use Authorization (EUA). This EUA will remain in effect (meaning this test can be used) for the duration of the COVID-19 declaration under Section 564(b)(1) of the Act, 21 U.S.C. section  360bbb-3(b)(1), unless the authorization is terminated or revoked.  Performed at [facility], [address] [place], [place] [address]   Blood culture (routine x 2)     Status: None (Preliminary result)   Collection Time: 01/27/24 11:58 PM   Specimen: BLOOD  Result Value Ref Range Status   Specimen Description   Final    BLOOD BLOOD RIGHT ARM Performed at [facility], [address], [place], [place] [address]    Special Requests   Final    BOTTLES DRAWN AEROBIC ONLY Blood Culture results may not be optimal due to an inadequate volume of blood received in culture bottles Performed at [facility], [address], [place], [place] [address]    Culture   Final    NO GROWTH 2 DAYS Performed at [facility], [address], [place], [place] [address]    Report Status PENDING  Incomplete  Blood culture (routine x 2)     Status: None (  Preliminary result)   Collection Time: 01/28/24 12:11 AM   Specimen: BLOOD  Result Value Ref Range Status   Specimen Description   Final    BLOOD BLOOD LEFT ARM Performed at [facility], [address], [place], [place] [address]    Special Requests   Final    BOTTLES DRAWN AEROBIC ONLY Blood Culture results may not be optimal due to an inadequate volume of blood received in culture bottles Performed at [facility], [address] [place], [place] [address]    Culture   Final    NO GROWTH 2 DAYS Performed at [facility], [address] [place], [place] [address]    Report Status PENDING  Incomplete    Labs: CBC: Recent Labs  Lab 01/27/24 1611 01/28/24 0011 01/29/24 0525 01/30/24 0458  WBC 22.5* 23.0* 17.5* 13.8*  NEUTROABS 18.5* 19.7*  --  9.4*  HGB 12.2* 12.7* 11.4* 11.3*  HCT 37.4* 39.7 35.9* 35.9*  MCV 91.9 93.2 94.5 94.7  PLT 309 298 293 299   Basic Metabolic Panel: Recent Labs  Lab 01/27/24 1611 01/28/24 0011  01/29/24 0525 01/30/24 0458 01/30/24 1109  NA 132* 129* 132* 130* 131*  K 4.6 3.9 3.9 4.0 3.9  CL 96* 96* 99 100 100  CO2 24 22 24 23 24   GLUCOSE 260* 178* 110* 113* 288*  BUN 49* 45* 43* 45* 44*  CREATININE 2.18* 2.01* 2.13* 2.41* 2.28*  CALCIUM  9.2 8.9 8.3* 7.9* 7.8*  MG  --  2.3  --   --   --    Liver Function Tests: Recent Labs  Lab 01/27/24 1611 01/28/24 0011  AST 20 21  ALT 24 23  ALKPHOS 81 79  BILITOT 0.8 1.2  PROT 7.2 7.2  ALBUMIN 3.1* 3.1*   CBG: Recent Labs  Lab 01/29/24 1204 01/29/24 1746 01/30/24 0007 01/30/24 0604 01/30/24 1204  GLUCAP 274* 261* 323* 110* 273*    Discharge time spent: greater than 30 minutes.  Signed: Lorita Rosa, MD [facility] 01/30/2024

## 2024-01-30 NOTE — Progress Notes (Signed)
 Subjective: CC: Patient denies abdominal pain. He is tolerating diet and denies nausea or vomiting. Having flatus and BMs.   Afebrile. No tachycardia or systolic hypotension. WBC continues to trend down - now down to 13.8  Foley out, voiding  Objective: Vital signs in last 24 hours: Temp:  [97.6 F (36.4 C)-97.7 F (36.5 C)] 97.7 F (36.5 C) (05/09 0408) Pulse Rate:  [59-74] 59 (05/09 0408) Resp:  [16-20] 16 (05/09 0408) BP: (100-121)/(46-60) 100/60 (05/09 0408) SpO2:  [95 %-100 %] 95 % (05/09 0408) Weight:  [75.2 kg] 75.2 kg (05/09 0607) Last BM Date : 01/29/24  Intake/Output from previous day: 05/08 0701 - 05/09 0700 In: 1170 [P.O.:970; IV Piggyback:200] Out: 450 [Urine:450] Intake/Output this shift: No intake/output data recorded.  PE: Gen:  Alert, NAD, pleasant Abd: Soft, nontender, nondistended Psych: A&Ox3   Lab Results:  Recent Labs    01/29/24 0525 01/30/24 0458  WBC 17.5* 13.8*  HGB 11.4* 11.3*  HCT 35.9* 35.9*  PLT 293 299   BMET Recent Labs    01/29/24 0525 01/30/24 0458  NA 132* 130*  K 3.9 4.0  CL 99 100  CO2 24 23  GLUCOSE 110* 113*  BUN 43* 45*  CREATININE 2.13* 2.41*  CALCIUM  8.3* 7.9*   PT/INR Recent Labs    01/28/24 0011  LABPROT 14.9  INR 1.2   CMP     Component Value Date/Time   NA 130 (L) 01/30/2024 0458   K 4.0 01/30/2024 0458   CL 100 01/30/2024 0458   CO2 23 01/30/2024 0458   GLUCOSE 113 (H) 01/30/2024 0458   BUN 45 (H) 01/30/2024 0458   CREATININE 2.41 (H) 01/30/2024 0458   CALCIUM  7.9 (L) 01/30/2024 0458   PROT 7.2 01/28/2024 0011   ALBUMIN 3.1 (L) 01/28/2024 0011   AST 21 01/28/2024 0011   ALT 23 01/28/2024 0011   ALKPHOS 79 01/28/2024 0011   BILITOT 1.2 01/28/2024 0011   GFRNONAA 25 (L) 01/30/2024 0458   Lipase     Component Value Date/Time   LIPASE 28 04/16/2023 1315    Studies/Results: No results found.   Anti-infectives: Anti-infectives (From admission, onward)    Start      Dose/Rate Route Frequency Ordered Stop   01/28/24 0000  piperacillin -tazobactam (ZOSYN ) IVPB 3.375 g        3.375 g 12.5 mL/hr over 240 Minutes Intravenous Every 8 hours 01/27/24 2240     01/27/24 2130  piperacillin -tazobactam (ZOSYN ) IVPB 3.375 g  Status:  Discontinued        3.375 g 100 mL/hr over 30 Minutes Intravenous  Once 01/27/24 2122 01/27/24 2342        Assessment/Plan RLQ abdominal pain - CT findings noted.  Unclear if this is from colitis, diverticulitis, perforated appendicitis or even potentially a colon cancer. -  Last colonoscopy on file 2012 w/ no evidence of colon polyps and no significant diverticular disease. Family hx of colon ca in his brother pre chart review.  - CEA 1.9 (normal) - Doing well - Transition to PO abx - Augmentin to complete 14 day course - Advance to soft diet. - Daily labs - May need colonoscopy at some point to help further evaluate  - We will follow closely during admission - today he continues to show clinical improvement.  Dr. Ramiro Burly had a long discussion with patient/family 5/7 (see her note) and 5/8. He has confirmed that his goal is to avoid an operation unless it is necessary  to save his life. He expresses understanding that his symptoms/CT findings could be due to infection or cancer along with the risk of bowel obstruction, perforation, and life-threatening sepsis.   FEN - reg diet, IVF per primary  VTE - SCDs, okay for chem ppx from a general surgery standpoint ID - Zosyn . Afebrile. WBC 13.8 <-- 17<-- 23  I reviewed nursing notes, hospitalist notes, last 24 h vitals and pain scores, last 48 h intake and output, last 24 h labs and trends, and last 24 h imaging results.   LOS: 3 days    I spent a total of 35 minutes in both face-to-face and non-face-to-face activities, excluding procedures performed, for this visit on the date of this encounter.  Beatris Lincoln, MD [facility]

## 2024-01-30 NOTE — Inpatient Diabetes Management (Signed)
 Inpatient Diabetes Program Recommendations  AACE/ADA: New Consensus Statement on Inpatient Glycemic Control (2015)  Target Ranges:  Prepandial:   less than 140 mg/dL      Peak postprandial:   less than 180 mg/dL (1-2 hours)      Critically ill patients:  140 - 180 mg/dL   Lab Results  Component Value Date   GLUCAP 110 (H) 01/30/2024   HGBA1C 7.0 (H) 01/28/2024    Review of Glycemic Control  Latest Reference Range & Units 01/29/24 06:55 01/29/24 12:04 01/29/24 17:46 01/30/24 00:07 01/30/24 06:04  Glucose-Capillary 70 - 99 mg/dL 045 (H) 409 (H) 811 (H) 323 (H) 110 (H)  (H): Data is abnormally high  Diabetes history: DM2 Outpatient Diabetes medications:  Glipizide  5 mg BID Sitagliptin 25 mg every day Actos 45 mg QD Current orders for Inpatient glycemic control:  Novolog  0-9 units Q6H  Inpatient Diabetes Program Recommendations:    Please consider:  Carb modified diet Novolog  0-9 units TID Novolog  3 units TID with meals if he consumes at least 50%  Will continue to follow while inpatient.  Thank you, Hays Lipschutz, MSN, CDCES Diabetes Coordinator Inpatient Diabetes Program [phone] (team pager from 8a-5p)

## 2024-01-30 NOTE — Care Management Important Message (Signed)
 Important Message  Patient Details IM Letter given. Name: MACKLYN PARPART MRN: [id number] Date of Birth: September 18, 1932   Important Message Given:  Yes - Medicare IM     Curtiss Dowdy 01/30/2024, 3:37 PM

## 2024-01-30 NOTE — TOC Progression Note (Signed)
 Transition of Care [facility]) - Progression Note    Patient Details  Name: Jorge Butler MRN: [id number] Date of Birth: Jul 13, 1932  Transition of [facility]) CM/SW Contact  Zoya Sprecher, Thersia Flax, RN Phone Number: 01/30/2024, 1:12 PM  Clinical Narrative: d/c plan home w/HHPT-no preference-[facility] rep Loetta Ringer aware;[employer] rep Zula Hitch aware of rw to deliver to rm prior d/c.Patient declines 3n1. Has own transport home.      Expected Discharge Plan: Home w Home Health Services Barriers to Discharge: Continued Medical Work up  Expected Discharge Plan and Services   Discharge Planning Services: CM Consult Post Acute Care Choice: Resumption of Svcs/PTA Provider Living arrangements for the past 2 months: Single Family Home                 DME Arranged: Walker rolling DME Agency: [employer] Date DME Agency Contacted: 01/30/24 Time DME Agency Contacted: 1311 Representative spoke with at DME Agency: Zula Hitch HH Arranged: PT   Date HH Agency Contacted: 01/30/24 Time HH Agency Contacted: 1312 Representative spoke with at [facility] Agency: Loetta Ringer   Social Determinants of Health (SDOH) Interventions SDOH Screenings   Food Insecurity: No Food Insecurity (01/28/2024)  Housing: Low Risk  (01/28/2024)  Transportation Needs: No Transportation Needs (01/28/2024)  Utilities: Not At Risk (01/28/2024)  Social Connections: Moderately Integrated (01/28/2024)  Tobacco Use: Low Risk  (01/28/2024)    Readmission Risk Interventions     No data to display

## 2024-02-02 LAB — CULTURE, BLOOD (ROUTINE X 2)
Culture: NO GROWTH
Culture: NO GROWTH

## 2024-02-04 DIAGNOSIS — H409 Unspecified glaucoma: Secondary | ICD-10-CM | POA: Diagnosis not present

## 2024-02-04 DIAGNOSIS — A419 Sepsis, unspecified organism: Secondary | ICD-10-CM | POA: Diagnosis not present

## 2024-02-04 DIAGNOSIS — E441 Mild protein-calorie malnutrition: Secondary | ICD-10-CM | POA: Diagnosis not present

## 2024-02-04 DIAGNOSIS — I129 Hypertensive chronic kidney disease with stage 1 through stage 4 chronic kidney disease, or unspecified chronic kidney disease: Secondary | ICD-10-CM | POA: Diagnosis not present

## 2024-02-04 DIAGNOSIS — K3532 Acute appendicitis with perforation and localized peritonitis, without abscess: Secondary | ICD-10-CM | POA: Diagnosis not present

## 2024-02-04 DIAGNOSIS — R339 Retention of urine, unspecified: Secondary | ICD-10-CM | POA: Diagnosis not present

## 2024-02-04 DIAGNOSIS — E039 Hypothyroidism, unspecified: Secondary | ICD-10-CM | POA: Diagnosis not present

## 2024-02-04 DIAGNOSIS — N1832 Chronic kidney disease, stage 3b: Secondary | ICD-10-CM | POA: Diagnosis not present

## 2024-02-04 DIAGNOSIS — E1122 Type 2 diabetes mellitus with diabetic chronic kidney disease: Secondary | ICD-10-CM | POA: Diagnosis not present

## 2024-02-04 DIAGNOSIS — E7800 Pure hypercholesterolemia, unspecified: Secondary | ICD-10-CM | POA: Diagnosis not present

## 2024-02-04 DIAGNOSIS — Z7984 Long term (current) use of oral hypoglycemic drugs: Secondary | ICD-10-CM | POA: Diagnosis not present

## 2024-02-04 DIAGNOSIS — Z7982 Long term (current) use of aspirin: Secondary | ICD-10-CM | POA: Diagnosis not present

## 2024-02-04 DIAGNOSIS — Z6824 Body mass index (BMI) 24.0-24.9, adult: Secondary | ICD-10-CM | POA: Diagnosis not present

## 2024-02-04 DIAGNOSIS — Z8673 Personal history of transient ischemic attack (TIA), and cerebral infarction without residual deficits: Secondary | ICD-10-CM | POA: Diagnosis not present

## 2024-02-09 DIAGNOSIS — R338 Other retention of urine: Secondary | ICD-10-CM | POA: Diagnosis not present

## 2024-02-13 DIAGNOSIS — E1122 Type 2 diabetes mellitus with diabetic chronic kidney disease: Secondary | ICD-10-CM | POA: Diagnosis not present

## 2024-02-13 DIAGNOSIS — E871 Hypo-osmolality and hyponatremia: Secondary | ICD-10-CM | POA: Diagnosis not present

## 2024-02-13 DIAGNOSIS — R1903 Right lower quadrant abdominal swelling, mass and lump: Secondary | ICD-10-CM | POA: Diagnosis not present

## 2024-02-20 DIAGNOSIS — N1832 Chronic kidney disease, stage 3b: Secondary | ICD-10-CM | POA: Diagnosis not present

## 2024-02-20 DIAGNOSIS — R339 Retention of urine, unspecified: Secondary | ICD-10-CM | POA: Diagnosis not present

## 2024-02-20 DIAGNOSIS — Z8673 Personal history of transient ischemic attack (TIA), and cerebral infarction without residual deficits: Secondary | ICD-10-CM | POA: Diagnosis not present

## 2024-02-20 DIAGNOSIS — H409 Unspecified glaucoma: Secondary | ICD-10-CM | POA: Diagnosis not present

## 2024-02-20 DIAGNOSIS — I129 Hypertensive chronic kidney disease with stage 1 through stage 4 chronic kidney disease, or unspecified chronic kidney disease: Secondary | ICD-10-CM | POA: Diagnosis not present

## 2024-02-20 DIAGNOSIS — E039 Hypothyroidism, unspecified: Secondary | ICD-10-CM | POA: Diagnosis not present

## 2024-02-20 DIAGNOSIS — E441 Mild protein-calorie malnutrition: Secondary | ICD-10-CM | POA: Diagnosis not present

## 2024-02-20 DIAGNOSIS — K3532 Acute appendicitis with perforation and localized peritonitis, without abscess: Secondary | ICD-10-CM | POA: Diagnosis not present

## 2024-02-20 DIAGNOSIS — Z7984 Long term (current) use of oral hypoglycemic drugs: Secondary | ICD-10-CM | POA: Diagnosis not present

## 2024-02-20 DIAGNOSIS — Z6824 Body mass index (BMI) 24.0-24.9, adult: Secondary | ICD-10-CM | POA: Diagnosis not present

## 2024-02-20 DIAGNOSIS — A419 Sepsis, unspecified organism: Secondary | ICD-10-CM | POA: Diagnosis not present

## 2024-02-20 DIAGNOSIS — E1122 Type 2 diabetes mellitus with diabetic chronic kidney disease: Secondary | ICD-10-CM | POA: Diagnosis not present

## 2024-02-20 DIAGNOSIS — Z7982 Long term (current) use of aspirin: Secondary | ICD-10-CM | POA: Diagnosis not present

## 2024-02-20 DIAGNOSIS — E7800 Pure hypercholesterolemia, unspecified: Secondary | ICD-10-CM | POA: Diagnosis not present

## 2024-02-25 DIAGNOSIS — I129 Hypertensive chronic kidney disease with stage 1 through stage 4 chronic kidney disease, or unspecified chronic kidney disease: Secondary | ICD-10-CM | POA: Diagnosis not present

## 2024-02-25 DIAGNOSIS — Z7982 Long term (current) use of aspirin: Secondary | ICD-10-CM | POA: Diagnosis not present

## 2024-02-25 DIAGNOSIS — Z7984 Long term (current) use of oral hypoglycemic drugs: Secondary | ICD-10-CM | POA: Diagnosis not present

## 2024-02-25 DIAGNOSIS — A419 Sepsis, unspecified organism: Secondary | ICD-10-CM | POA: Diagnosis not present

## 2024-02-25 DIAGNOSIS — K3532 Acute appendicitis with perforation and localized peritonitis, without abscess: Secondary | ICD-10-CM | POA: Diagnosis not present

## 2024-02-25 DIAGNOSIS — Z6824 Body mass index (BMI) 24.0-24.9, adult: Secondary | ICD-10-CM | POA: Diagnosis not present

## 2024-02-25 DIAGNOSIS — N1832 Chronic kidney disease, stage 3b: Secondary | ICD-10-CM | POA: Diagnosis not present

## 2024-02-25 DIAGNOSIS — E039 Hypothyroidism, unspecified: Secondary | ICD-10-CM | POA: Diagnosis not present

## 2024-02-25 DIAGNOSIS — Z8673 Personal history of transient ischemic attack (TIA), and cerebral infarction without residual deficits: Secondary | ICD-10-CM | POA: Diagnosis not present

## 2024-02-25 DIAGNOSIS — R339 Retention of urine, unspecified: Secondary | ICD-10-CM | POA: Diagnosis not present

## 2024-02-25 DIAGNOSIS — E1122 Type 2 diabetes mellitus with diabetic chronic kidney disease: Secondary | ICD-10-CM | POA: Diagnosis not present

## 2024-02-25 DIAGNOSIS — E441 Mild protein-calorie malnutrition: Secondary | ICD-10-CM | POA: Diagnosis not present

## 2024-02-25 DIAGNOSIS — H409 Unspecified glaucoma: Secondary | ICD-10-CM | POA: Diagnosis not present

## 2024-02-25 DIAGNOSIS — E7800 Pure hypercholesterolemia, unspecified: Secondary | ICD-10-CM | POA: Diagnosis not present

## 2024-02-26 DIAGNOSIS — N401 Enlarged prostate with lower urinary tract symptoms: Secondary | ICD-10-CM | POA: Diagnosis not present

## 2024-02-26 DIAGNOSIS — R338 Other retention of urine: Secondary | ICD-10-CM | POA: Diagnosis not present

## 2024-03-01 DIAGNOSIS — K3532 Acute appendicitis with perforation and localized peritonitis, without abscess: Secondary | ICD-10-CM | POA: Diagnosis not present

## 2024-03-13 ENCOUNTER — Emergency Department (HOSPITAL_COMMUNITY)

## 2024-03-13 ENCOUNTER — Other Ambulatory Visit: Payer: Self-pay

## 2024-03-13 ENCOUNTER — Inpatient Hospital Stay (HOSPITAL_COMMUNITY)
Admission: EM | Admit: 2024-03-13 | Discharge: 2024-03-18 | DRG: 699 | Disposition: A | Discharge status: Skilled Nursing Facility | Attending: Internal Medicine | Admitting: Internal Medicine

## 2024-03-13 DIAGNOSIS — I129 Hypertensive chronic kidney disease with stage 1 through stage 4 chronic kidney disease, or unspecified chronic kidney disease: Secondary | ICD-10-CM | POA: Diagnosis present

## 2024-03-13 DIAGNOSIS — I959 Hypotension, unspecified: Secondary | ICD-10-CM | POA: Diagnosis not present

## 2024-03-13 DIAGNOSIS — E1165 Type 2 diabetes mellitus with hyperglycemia: Secondary | ICD-10-CM | POA: Diagnosis present

## 2024-03-13 DIAGNOSIS — Z8673 Personal history of transient ischemic attack (TIA), and cerebral infarction without residual deficits: Secondary | ICD-10-CM

## 2024-03-13 DIAGNOSIS — E875 Hyperkalemia: Secondary | ICD-10-CM | POA: Diagnosis not present

## 2024-03-13 DIAGNOSIS — E119 Type 2 diabetes mellitus without complications: Secondary | ICD-10-CM

## 2024-03-13 DIAGNOSIS — R5381 Other malaise: Secondary | ICD-10-CM | POA: Diagnosis not present

## 2024-03-13 DIAGNOSIS — E1122 Type 2 diabetes mellitus with diabetic chronic kidney disease: Secondary | ICD-10-CM | POA: Diagnosis present

## 2024-03-13 DIAGNOSIS — Z7401 Bed confinement status: Secondary | ICD-10-CM | POA: Diagnosis not present

## 2024-03-13 DIAGNOSIS — N179 Acute kidney failure, unspecified: Secondary | ICD-10-CM | POA: Diagnosis not present

## 2024-03-13 DIAGNOSIS — T796XXA Traumatic ischemia of muscle, initial encounter: Secondary | ICD-10-CM | POA: Diagnosis not present

## 2024-03-13 DIAGNOSIS — Z8249 Family history of ischemic heart disease and other diseases of the circulatory system: Secondary | ICD-10-CM | POA: Diagnosis not present

## 2024-03-13 DIAGNOSIS — T83518A Infection and inflammatory reaction due to other urinary catheter, initial encounter: Secondary | ICD-10-CM | POA: Diagnosis not present

## 2024-03-13 DIAGNOSIS — M160 Bilateral primary osteoarthritis of hip: Secondary | ICD-10-CM | POA: Diagnosis not present

## 2024-03-13 DIAGNOSIS — N1832 Chronic kidney disease, stage 3b: Secondary | ICD-10-CM | POA: Diagnosis not present

## 2024-03-13 DIAGNOSIS — S3993XA Unspecified injury of pelvis, initial encounter: Secondary | ICD-10-CM | POA: Diagnosis not present

## 2024-03-13 DIAGNOSIS — Z66 Do not resuscitate: Secondary | ICD-10-CM | POA: Diagnosis not present

## 2024-03-13 DIAGNOSIS — R339 Retention of urine, unspecified: Secondary | ICD-10-CM

## 2024-03-13 DIAGNOSIS — S0990XA Unspecified injury of head, initial encounter: Secondary | ICD-10-CM | POA: Diagnosis not present

## 2024-03-13 DIAGNOSIS — Z79890 Hormone replacement therapy (postmenopausal): Secondary | ICD-10-CM | POA: Diagnosis not present

## 2024-03-13 DIAGNOSIS — N1330 Unspecified hydronephrosis: Secondary | ICD-10-CM | POA: Diagnosis present

## 2024-03-13 DIAGNOSIS — I10 Essential (primary) hypertension: Secondary | ICD-10-CM | POA: Diagnosis present

## 2024-03-13 DIAGNOSIS — Z7984 Long term (current) use of oral hypoglycemic drugs: Secondary | ICD-10-CM | POA: Diagnosis not present

## 2024-03-13 DIAGNOSIS — Z043 Encounter for examination and observation following other accident: Secondary | ICD-10-CM | POA: Diagnosis not present

## 2024-03-13 DIAGNOSIS — Z79899 Other long term (current) drug therapy: Secondary | ICD-10-CM

## 2024-03-13 DIAGNOSIS — R748 Abnormal levels of other serum enzymes: Secondary | ICD-10-CM | POA: Diagnosis present

## 2024-03-13 DIAGNOSIS — E782 Mixed hyperlipidemia: Secondary | ICD-10-CM | POA: Diagnosis not present

## 2024-03-13 DIAGNOSIS — R7989 Other specified abnormal findings of blood chemistry: Secondary | ICD-10-CM

## 2024-03-13 DIAGNOSIS — E162 Hypoglycemia, unspecified: Secondary | ICD-10-CM | POA: Diagnosis not present

## 2024-03-13 DIAGNOSIS — N19 Unspecified kidney failure: Secondary | ICD-10-CM | POA: Insufficient documentation

## 2024-03-13 DIAGNOSIS — E7800 Pure hypercholesterolemia, unspecified: Secondary | ICD-10-CM | POA: Diagnosis present

## 2024-03-13 DIAGNOSIS — E161 Other hypoglycemia: Secondary | ICD-10-CM | POA: Diagnosis not present

## 2024-03-13 DIAGNOSIS — R413 Other amnesia: Secondary | ICD-10-CM | POA: Diagnosis present

## 2024-03-13 DIAGNOSIS — R531 Weakness: Secondary | ICD-10-CM | POA: Diagnosis not present

## 2024-03-13 DIAGNOSIS — D631 Anemia in chronic kidney disease: Secondary | ICD-10-CM | POA: Diagnosis not present

## 2024-03-13 DIAGNOSIS — W19XXXA Unspecified fall, initial encounter: Secondary | ICD-10-CM | POA: Diagnosis not present

## 2024-03-13 DIAGNOSIS — H409 Unspecified glaucoma: Secondary | ICD-10-CM | POA: Diagnosis present

## 2024-03-13 DIAGNOSIS — E785 Hyperlipidemia, unspecified: Secondary | ICD-10-CM | POA: Diagnosis present

## 2024-03-13 DIAGNOSIS — N3080 Other cystitis without hematuria: Secondary | ICD-10-CM | POA: Diagnosis not present

## 2024-03-13 DIAGNOSIS — Z7982 Long term (current) use of aspirin: Secondary | ICD-10-CM

## 2024-03-13 DIAGNOSIS — W19XXXD Unspecified fall, subsequent encounter: Secondary | ICD-10-CM | POA: Diagnosis not present

## 2024-03-13 DIAGNOSIS — R0902 Hypoxemia: Secondary | ICD-10-CM | POA: Diagnosis not present

## 2024-03-13 DIAGNOSIS — N400 Enlarged prostate without lower urinary tract symptoms: Secondary | ICD-10-CM | POA: Diagnosis present

## 2024-03-13 DIAGNOSIS — Z981 Arthrodesis status: Secondary | ICD-10-CM | POA: Diagnosis not present

## 2024-03-13 DIAGNOSIS — J439 Emphysema, unspecified: Secondary | ICD-10-CM | POA: Diagnosis not present

## 2024-03-13 DIAGNOSIS — S199XXA Unspecified injury of neck, initial encounter: Secondary | ICD-10-CM | POA: Diagnosis not present

## 2024-03-13 DIAGNOSIS — K5900 Constipation, unspecified: Secondary | ICD-10-CM | POA: Diagnosis not present

## 2024-03-13 DIAGNOSIS — B9620 Unspecified Escherichia coli [E. coli] as the cause of diseases classified elsewhere: Secondary | ICD-10-CM | POA: Diagnosis present

## 2024-03-13 DIAGNOSIS — M25551 Pain in right hip: Secondary | ICD-10-CM | POA: Diagnosis not present

## 2024-03-13 DIAGNOSIS — Y846 Urinary catheterization as the cause of abnormal reaction of the patient, or of later complication, without mention of misadventure at the time of the procedure: Secondary | ICD-10-CM | POA: Diagnosis present

## 2024-03-13 DIAGNOSIS — E871 Hypo-osmolality and hyponatremia: Secondary | ICD-10-CM | POA: Diagnosis present

## 2024-03-13 DIAGNOSIS — E039 Hypothyroidism, unspecified: Secondary | ICD-10-CM | POA: Diagnosis not present

## 2024-03-13 DIAGNOSIS — N320 Bladder-neck obstruction: Secondary | ICD-10-CM | POA: Diagnosis present

## 2024-03-13 DIAGNOSIS — Z882 Allergy status to sulfonamides status: Secondary | ICD-10-CM

## 2024-03-13 DIAGNOSIS — S299XXA Unspecified injury of thorax, initial encounter: Secondary | ICD-10-CM | POA: Diagnosis not present

## 2024-03-13 DIAGNOSIS — N3001 Acute cystitis with hematuria: Secondary | ICD-10-CM | POA: Diagnosis not present

## 2024-03-13 DIAGNOSIS — S3991XA Unspecified injury of abdomen, initial encounter: Secondary | ICD-10-CM | POA: Diagnosis not present

## 2024-03-13 LAB — URINALYSIS, W/ REFLEX TO CULTURE (INFECTION SUSPECTED)
Bilirubin Urine: NEGATIVE
Glucose, UA: 50 mg/dL — AB
Ketones, ur: NEGATIVE mg/dL
Nitrite: NEGATIVE
Protein, ur: 100 mg/dL — AB
RBC / HPF: >50 RBC/hpf (ref 0–5)
Specific Gravity, Urine: 1.01 (ref 1.005–1.030)
WBC, UA: >50 WBC/hpf (ref 0–5)
pH: 6 (ref 5.0–8.0)

## 2024-03-13 LAB — PROTIME-INR
INR: 1.1 (ref 0.8–1.2)
Prothrombin Time: 14.8 s (ref 11.4–15.2)

## 2024-03-13 LAB — BASIC METABOLIC PANEL WITH GFR
Anion gap: 20 — ABNORMAL HIGH (ref 5–15)
Anion gap: 15 (ref 5–15)
BUN: 147 mg/dL — ABNORMAL HIGH (ref 8–23)
BUN: 145 mg/dL — ABNORMAL HIGH (ref 8–23)
CO2: 16 mmol/L — ABNORMAL LOW (ref 22–32)
CO2: 18 mmol/L — ABNORMAL LOW (ref 22–32)
Calcium: 8 mg/dL — ABNORMAL LOW (ref 8.9–10.3)
Calcium: 7.9 mg/dL — ABNORMAL LOW (ref 8.9–10.3)
Chloride: 96 mmol/L — ABNORMAL LOW (ref 98–111)
Chloride: 100 mmol/L (ref 98–111)
Creatinine, Ser: 8 mg/dL — ABNORMAL HIGH (ref 0.61–1.24)
Creatinine, Ser: 7.52 mg/dL — ABNORMAL HIGH (ref 0.61–1.24)
GFR, Estimated: 6 mL/min — ABNORMAL LOW (ref >60)
GFR, Estimated: 6 mL/min — ABNORMAL LOW (ref >60)
Glucose, Bld: 180 mg/dL — ABNORMAL HIGH (ref 70–99)
Glucose, Bld: 102 mg/dL — ABNORMAL HIGH (ref 70–99)
Potassium: 5.3 mmol/L — ABNORMAL HIGH (ref 3.5–5.1)
Potassium: 5.7 mmol/L — ABNORMAL HIGH (ref 3.5–5.1)
Sodium: 132 mmol/L — ABNORMAL LOW (ref 135–145)
Sodium: 133 mmol/L — ABNORMAL LOW (ref 135–145)

## 2024-03-13 LAB — I-STAT CHEM 8, ED
BUN: >130 mg/dL — ABNORMAL HIGH (ref 8–23)
Calcium, Ion: 0.96 mmol/L — ABNORMAL LOW (ref 1.15–1.40)
Chloride: 93 mmol/L — ABNORMAL LOW (ref 98–111)
Creatinine, Ser: 10 mg/dL — ABNORMAL HIGH (ref 0.61–1.24)
Glucose, Bld: 348 mg/dL — ABNORMAL HIGH (ref 70–99)
HCT: 35 % — ABNORMAL LOW (ref 39.0–52.0)
Hemoglobin: 11.9 g/dL — ABNORMAL LOW (ref 13.0–17.0)
Potassium: 6.9 mmol/L (ref 3.5–5.1)
Sodium: 123 mmol/L — ABNORMAL LOW (ref 135–145)
TCO2: 18 mmol/L — ABNORMAL LOW (ref 22–32)

## 2024-03-13 LAB — COMPREHENSIVE METABOLIC PANEL WITH GFR
ALT: 21 U/L (ref 0–44)
AST: 32 U/L (ref 15–41)
Albumin: 2.4 g/dL — ABNORMAL LOW (ref 3.5–5.0)
Alkaline Phosphatase: 102 U/L (ref 38–126)
Anion gap: 21 — ABNORMAL HIGH (ref 5–15)
BUN: 163 mg/dL — ABNORMAL HIGH (ref 8–23)
CO2: 15 mmol/L — ABNORMAL LOW (ref 22–32)
Calcium: 8.2 mg/dL — ABNORMAL LOW (ref 8.9–10.3)
Chloride: 89 mmol/L — ABNORMAL LOW (ref 98–111)
Creatinine, Ser: 9.34 mg/dL — ABNORMAL HIGH (ref 0.61–1.24)
GFR, Estimated: 5 mL/min — ABNORMAL LOW (ref >60)
Glucose, Bld: 352 mg/dL — ABNORMAL HIGH (ref 70–99)
Potassium: 6.9 mmol/L (ref 3.5–5.1)
Sodium: 125 mmol/L — ABNORMAL LOW (ref 135–145)
Total Bilirubin: 0.9 mg/dL (ref 0.0–1.2)
Total Protein: 7 g/dL (ref 6.5–8.1)

## 2024-03-13 LAB — CBC
HCT: 33.2 % — ABNORMAL LOW (ref 39.0–52.0)
Hemoglobin: 11 g/dL — ABNORMAL LOW (ref 13.0–17.0)
MCH: 28.6 pg (ref 26.0–34.0)
MCHC: 33.1 g/dL (ref 30.0–36.0)
MCV: 86.2 fL (ref 80.0–100.0)
Platelets: 332 10*3/uL (ref 150–400)
RBC: 3.85 MIL/uL — ABNORMAL LOW (ref 4.22–5.81)
RDW: 14.6 % (ref 11.5–15.5)
WBC: 25.5 10*3/uL — ABNORMAL HIGH (ref 4.0–10.5)
nRBC: 0 % (ref 0.0–0.2)

## 2024-03-13 LAB — TROPONIN I (HIGH SENSITIVITY)
Troponin I (High Sensitivity): 27 ng/L — ABNORMAL HIGH (ref <18)
Troponin I (High Sensitivity): 36 ng/L — ABNORMAL HIGH (ref <18)

## 2024-03-13 LAB — CK: Total CK: 1014 U/L — ABNORMAL HIGH (ref 49–397)

## 2024-03-13 LAB — I-STAT CG4 LACTIC ACID, ED: Lactic Acid, Venous: 1.9 mmol/L (ref 0.5–1.9)

## 2024-03-13 LAB — GLUCOSE, CAPILLARY
Glucose-Capillary: 158 mg/dL — ABNORMAL HIGH (ref 70–99)
Glucose-Capillary: 92 mg/dL (ref 70–99)

## 2024-03-13 LAB — D-DIMER, QUANTITATIVE: D-Dimer, Quant: >20 ug{FEU}/mL — ABNORMAL HIGH (ref 0.00–0.50)

## 2024-03-13 LAB — CBG MONITORING, ED: Glucose-Capillary: 226 mg/dL — ABNORMAL HIGH (ref 70–99)

## 2024-03-13 LAB — BETA-HYDROXYBUTYRIC ACID: Beta-Hydroxybutyric Acid: 0.34 mmol/L — ABNORMAL HIGH (ref 0.05–0.27)

## 2024-03-13 LAB — MAGNESIUM: Magnesium: 2.3 mg/dL (ref 1.7–2.4)

## 2024-03-13 LAB — OSMOLALITY: Osmolality: 340 mosm/kg (ref 275–295)

## 2024-03-13 MED ORDER — INSULIN ASPART 100 UNIT/ML IJ SOLN
0.0000 [IU] | INTRAMUSCULAR | Status: DC
  Administered 2024-03-13: 2 [IU] via SUBCUTANEOUS
  Administered 2024-03-13: 3 [IU] via SUBCUTANEOUS
  Administered 2024-03-14: 5 [IU] via SUBCUTANEOUS
  Administered 2024-03-14: 3 [IU] via SUBCUTANEOUS
  Administered 2024-03-14: 2 [IU] via SUBCUTANEOUS
  Administered 2024-03-14 (×2): 3 [IU] via SUBCUTANEOUS
  Administered 2024-03-15: 5 [IU] via SUBCUTANEOUS
  Administered 2024-03-15: 7 [IU] via SUBCUTANEOUS
  Administered 2024-03-15 (×2): 3 [IU] via SUBCUTANEOUS
  Administered 2024-03-15: 2 [IU] via SUBCUTANEOUS
  Administered 2024-03-16 (×2): 3 [IU] via SUBCUTANEOUS
  Administered 2024-03-16: 2 [IU] via SUBCUTANEOUS
  Administered 2024-03-16: 5 [IU] via SUBCUTANEOUS
  Administered 2024-03-16: 2 [IU] via SUBCUTANEOUS
  Administered 2024-03-16: 1 [IU] via SUBCUTANEOUS
  Administered 2024-03-17: 9 [IU] via SUBCUTANEOUS
  Administered 2024-03-17: 7 [IU] via SUBCUTANEOUS
  Administered 2024-03-17: 1 [IU] via SUBCUTANEOUS
  Administered 2024-03-17: 3 [IU] via SUBCUTANEOUS

## 2024-03-13 MED ORDER — LACTATED RINGERS IV BOLUS: 500.0000 mL | Freq: Once | INTRAVENOUS | Status: DC

## 2024-03-13 MED ORDER — LEVOTHYROXINE SODIUM 88 MCG PO TABS
88.0000 ug | ORAL_TABLET | Freq: Every day | ORAL | Status: DC
  Administered 2024-03-14 – 2024-03-18 (×5): 88 ug via ORAL
  Filled 2024-03-13 (×5): qty 1

## 2024-03-13 MED ORDER — SODIUM CHLORIDE 0.9 % IV SOLN
1.0000 g | INTRAVENOUS | Status: DC
  Administered 2024-03-14: 1 g via INTRAVENOUS
  Filled 2024-03-13 (×2): qty 10

## 2024-03-13 MED ORDER — ALBUTEROL SULFATE (2.5 MG/3ML) 0.083% IN NEBU
2.5000 mg | INHALATION_SOLUTION | Freq: Once | RESPIRATORY_TRACT | Status: AC
  Administered 2024-03-13: 2.5 mg via RESPIRATORY_TRACT
  Filled 2024-03-13: qty 3

## 2024-03-13 MED ORDER — SODIUM CHLORIDE 0.9% FLUSH
3.0000 mL | Freq: Two times a day (BID) | INTRAVENOUS | Status: DC
  Administered 2024-03-13 – 2024-03-18 (×10): 3 mL via INTRAVENOUS

## 2024-03-13 MED ORDER — HEPARIN SODIUM (PORCINE) 5000 UNIT/ML IJ SOLN
5000.0000 [IU] | Freq: Three times a day (TID) | INTRAMUSCULAR | Status: DC
  Administered 2024-03-13 – 2024-03-18 (×14): 5000 [IU] via SUBCUTANEOUS
  Filled 2024-03-13 (×14): qty 1

## 2024-03-13 MED ORDER — ACETAMINOPHEN 325 MG PO TABS: 650.0000 mg | ORAL_TABLET | Freq: Four times a day (QID) | ORAL | Status: DC | PRN

## 2024-03-13 MED ORDER — ASPIRIN 81 MG PO TBEC
81.0000 mg | DELAYED_RELEASE_TABLET | Freq: Every day | ORAL | Status: DC
  Administered 2024-03-14 – 2024-03-18 (×5): 81 mg via ORAL
  Filled 2024-03-13 (×5): qty 1

## 2024-03-13 MED ORDER — SODIUM CHLORIDE 0.9 % IV SOLN
1.0000 g | Freq: Once | INTRAVENOUS | Status: AC
  Administered 2024-03-13: 1 g via INTRAVENOUS
  Filled 2024-03-13: qty 1

## 2024-03-13 MED ORDER — SODIUM CHLORIDE 0.9 % IV BOLUS
1000.0000 mL | Freq: Once | INTRAVENOUS | Status: AC
  Administered 2024-03-13: 1000 mL via INTRAVENOUS

## 2024-03-13 MED ORDER — SODIUM ZIRCONIUM CYCLOSILICATE 10 G PO PACK
10.0000 g | PACK | Freq: Once | ORAL | Status: AC
  Administered 2024-03-13: 10 g via ORAL
  Filled 2024-03-13: qty 1

## 2024-03-13 MED ORDER — LACTATED RINGERS IV BOLUS
1000.0000 mL | Freq: Once | INTRAVENOUS | Status: AC
  Administered 2024-03-13: 1000 mL via INTRAVENOUS

## 2024-03-13 MED ORDER — ATORVASTATIN CALCIUM 10 MG PO TABS
10.0000 mg | ORAL_TABLET | Freq: Every day | ORAL | Status: DC
  Administered 2024-03-14 – 2024-03-18 (×5): 10 mg via ORAL
  Filled 2024-03-13 (×5): qty 1

## 2024-03-13 MED ORDER — FINASTERIDE 5 MG PO TABS
5.0000 mg | ORAL_TABLET | Freq: Every day | ORAL | Status: DC
  Administered 2024-03-14 – 2024-03-18 (×5): 5 mg via ORAL
  Filled 2024-03-13 (×5): qty 1

## 2024-03-13 MED ORDER — INSULIN ASPART 100 UNIT/ML IV SOLN
5.0000 [IU] | Freq: Once | INTRAVENOUS | Status: AC
  Administered 2024-03-13: 5 [IU] via INTRAVENOUS

## 2024-03-13 MED ORDER — ACETAMINOPHEN 650 MG RE SUPP: 650.0000 mg | Freq: Four times a day (QID) | RECTAL | Status: DC | PRN

## 2024-03-13 MED ORDER — TAMSULOSIN HCL 0.4 MG PO CAPS
0.4000 mg | ORAL_CAPSULE | Freq: Every day | ORAL | Status: DC
  Administered 2024-03-14 – 2024-03-18 (×5): 0.4 mg via ORAL
  Filled 2024-03-13 (×5): qty 1

## 2024-03-13 NOTE — ED Provider Notes (Signed)
 [place] EMERGENCY DEPARTMENT AT [facility] Provider Note   CSN: [id number] Arrival date & time: 03/13/24  1351     Patient presents with: Jorge Butler   RANEN DOOLIN is a 89 y.o. male.   HPI patient presents after fall.  Medical history includes DM, BPH, HLD, CVA, CKD.  He lives at home independently.  He has an indwelling Foley catheter but is unsure of how long it has been in place.  Per chart review, Foley catheter was placed during admission 7 weeks ago.  He failed voiding trial while in the hospital and Foley was replaced.  Plan was for follow-up with urology.  Patient's family reports that they drain his urine bag.  This was last drained yesterday.  At some point yesterday, patient had a fall and was likely on the ground at his home for 24 hours.  He does not recall falling.  Family found him on the floor today and called EMS.  They did note an empty Foley bag.  Patient denies any areas of pain.  EMS reports normal heart rate, elevated blood pressure, hypoxia with SpO2 of 87% on room air, improved with 3 L of supplemental oxygen, glucose in the 400s.    Prior to Admission medications   Medication Sig Start Date End Date Taking? Authorizing Provider  aspirin  EC 81 MG tablet Take 81 mg by mouth daily. 08/26/13  Yes [provider]  atorvastatin  (LIPITOR) 20 MG tablet Take 20 mg by mouth daily.   Yes [provider]  cyanocobalamin (VITAMIN B12) 500 MCG tablet Take 500 mcg by mouth daily.   Yes [provider]  finasteride  (PROSCAR ) 5 MG tablet Take 5 mg by mouth daily. 08/02/09  Yes [provider]  glipiZIDE  (GLUCOTROL ) 5 MG tablet Take 5 mg by mouth 2 (two) times daily before a meal.   Yes [provider]  levothyroxine  (SYNTHROID ) 88 MCG tablet Take 88 mcg by mouth daily before breakfast. 12/04/22  Yes [provider]  Multiple Vitamin (MULTIVITAMIN WITH MINERALS) TABS tablet Take 1 tablet by mouth daily.   Yes [provider]  omega-3 acid ethyl esters (LOVAZA) 1 g capsule Take 1 g by mouth daily.   Yes [provider]  pioglitazone (ACTOS) 45 MG tablet Take 45 mg by mouth daily.   Yes [provider]  Saw Palmetto, Serenoa repens, (SAW PALMETTO PO) Take 1 capsule by mouth daily.   Yes [provider]  SITagliptin 25 MG TABS Take 1 tablet by mouth daily.   Yes [provider]  Testosterone  Cypionate 200 MG/ML SOSY Inject 200 mg into the muscle every 21 ( twenty-one) days.   Yes [provider]  polyethylene glycol (MIRALAX  / GLYCOLAX ) 17 g packet Take 17 g by mouth daily. Patient not taking: Reported on 03/13/2024 04/20/23   Dorinda Drue DASEN, MD  tamsulosin  (FLOMAX ) 0.4 MG CAPS capsule Take 0.4 mg by mouth daily. Patient not taking: Reported on 03/13/2024 03/22/20   [provider]    Allergies: Sulfa antibiotics    Review of Systems  Genitourinary:  Positive for decreased urine volume.  Neurological:  Positive for weakness (Generalized).  Psychiatric/Behavioral:  Positive for confusion.   All other systems reviewed and are negative.   Updated Vital Signs BP (!) 101/51   Pulse 79   Temp 97.8 F (36.6 C) (Oral)   Resp (!) 23   Ht 5' 10 (1.778 m)   Wt 75.2 kg   SpO2 97%  BMI 23.79 kg/m   Physical Exam Vitals and nursing note reviewed.  Constitutional:      General: He is not in acute distress.    Appearance: Normal appearance. He is well-developed. He is not ill-appearing, toxic-appearing or diaphoretic.  HENT:     Head: Normocephalic and atraumatic.     Right Ear: External ear normal.     Left Ear: External ear normal.     Nose: Nose normal.     Mouth/Throat:     Mouth: Mucous membranes are moist.   Eyes:     Extraocular Movements: Extraocular movements intact.     Conjunctiva/sclera: Conjunctivae normal.    Cardiovascular:     Rate and Rhythm: Normal rate and regular rhythm.     Heart sounds: No murmur heard. Pulmonary:      Effort: Pulmonary effort is normal. No respiratory distress.     Breath sounds: Decreased breath sounds present. No wheezing or rales.  Chest:     Chest wall: No tenderness.  Abdominal:     General: There is no distension.     Palpations: Abdomen is soft.     Tenderness: There is no abdominal tenderness.  Genitourinary:    Comments: Foley catheter in place.  Tubing is torn.  Foley bag is empty.  Musculoskeletal:        General: No swelling.     Cervical back: Normal range of motion and neck supple.   Skin:    General: Skin is warm and dry.   Neurological:     General: No focal deficit present.     Mental Status: He is alert.     Cranial Nerves: No cranial nerve deficit.     Sensory: No sensory deficit.     Motor: No weakness.     Coordination: Coordination normal.   Psychiatric:        Mood and Affect: Mood normal.        Behavior: Behavior normal.     (all labs ordered are listed, but only abnormal results are displayed) Labs Reviewed  COMPREHENSIVE METABOLIC PANEL WITH GFR - Abnormal; Notable for the following components:      Result Value   Sodium 125 (*)    Potassium 6.9 (*)    Chloride 89 (*)    CO2 15 (*)    Glucose, Bld 352 (*)    BUN 163 (*)    Creatinine, Ser 9.34 (*)    Calcium  8.2 (*)    Albumin 2.4 (*)    GFR, Estimated 5 (*)    Anion gap 21 (*)    All other components within normal limits  CBC - Abnormal; Notable for the following components:   WBC 25.5 (*)    RBC 3.85 (*)    Hemoglobin 11.0 (*)    HCT 33.2 (*)    All other components within normal limits  URINALYSIS, W/ REFLEX TO CULTURE (INFECTION SUSPECTED) - Abnormal; Notable for the following components:   APPearance TURBID (*)    Glucose, UA 50 (*)    Hgb urine dipstick LARGE (*)    Protein, ur 100 (*)    Leukocytes,Ua MODERATE (*)    Bacteria, UA MANY (*)    All other components within normal limits  CK - Abnormal; Notable for the following components:   Total CK 1,014 (*)     All other components within normal limits  OSMOLALITY - Abnormal; Notable for the following components:   Osmolality 340 (*)    All other  components within normal limits  BETA-HYDROXYBUTYRIC ACID - Abnormal; Notable for the following components:   Beta-Hydroxybutyric Acid 0.34 (*)    All other components within normal limits  D-DIMER, QUANTITATIVE - Abnormal; Notable for the following components:   D-Dimer, Quant >20.00 (*)    All other components within normal limits  BASIC METABOLIC PANEL WITH GFR - Abnormal; Notable for the following components:   Sodium 132 (*)    Potassium 5.3 (*)    Chloride 96 (*)    CO2 16 (*)    Glucose, Bld 180 (*)    BUN 147 (*)    Creatinine, Ser 8.00 (*)    Calcium  8.0 (*)    GFR, Estimated 6 (*)    Anion gap 20 (*)    All other components within normal limits  GLUCOSE, CAPILLARY - Abnormal; Notable for the following components:   Glucose-Capillary 158 (*)    All other components within normal limits  I-STAT CHEM 8, ED - Abnormal; Notable for the following components:   Sodium 123 (*)    Potassium 6.9 (*)    Chloride 93 (*)    BUN >130 (*)    Creatinine, Ser 10.00 (*)    Glucose, Bld 348 (*)    Calcium , Ion 0.96 (*)    TCO2 18 (*)    Hemoglobin 11.9 (*)    HCT 35.0 (*)    All other components within normal limits  CBG MONITORING, ED - Abnormal; Notable for the following components:   Glucose-Capillary 226 (*)    All other components within normal limits  TROPONIN I (HIGH SENSITIVITY) - Abnormal; Notable for the following components:   Troponin I (High Sensitivity) 27 (*)    All other components within normal limits  TROPONIN I (HIGH SENSITIVITY) - Abnormal; Notable for the following components:   Troponin I (High Sensitivity) 36 (*)    All other components within normal limits  URINE CULTURE  PROTIME-INR  MAGNESIUM  BRAIN NATRIURETIC PEPTIDE  BASIC METABOLIC PANEL WITH GFR  BASIC METABOLIC PANEL WITH GFR  BASIC METABOLIC PANEL WITH GFR   CBC  CK  BASIC METABOLIC PANEL WITH GFR  I-STAT CG4 LACTIC ACID, ED    EKG: None  Radiology: CT CHEST ABDOMEN PELVIS WO CONTRAST Result Date: 03/13/2024 CLINICAL DATA:  Trauma EXAM: CT CHEST, ABDOMEN AND PELVIS WITHOUT CONTRAST TECHNIQUE: Multidetector CT imaging of the chest, abdomen and pelvis was performed following the standard protocol without IV contrast. RADIATION DOSE REDUCTION: This exam was performed according to the departmental dose-optimization program which includes automated exposure control, adjustment of the mA and/or kV according to patient size and/or use of iterative reconstruction technique. COMPARISON:  CT abdomen and pelvis 01/27/2024 FINDINGS: CT CHEST FINDINGS Cardiovascular: No significant vascular findings. Normal heart size. No pericardial effusion. Mediastinum/Nodes: No enlarged mediastinal, hilar, or axillary lymph nodes. Thyroid  gland, trachea, and esophagus demonstrate no significant findings. Lungs/Pleura: There is linear atelectasis in the bilateral lower lobes. The lungs are otherwise clear. There is no pleural effusion or pneumothorax. Musculoskeletal: No acute fractures are seen. CT ABDOMEN PELVIS FINDINGS Hepatobiliary: Gallstones are present. Liver is within normal limits. There is no biliary ductal dilatation. Pancreas: Unremarkable. No pancreatic ductal dilatation or surrounding inflammatory changes. Spleen: Normal in size without focal abnormality. Adrenals/Urinary Tract: There is mild bilateral hydroureteronephrosis to the level of the bladder. No urinary tract calculi are seen. The kidneys are otherwise within normal limits. The adrenal glands are within normal limits. There is diffuse bladder wall thickening. The  bladder is decompressed by Foley catheter. There are findings suspicious for pneumatosis of the bladder wall there is inflammatory stranding surrounding the bladder. Stomach/Bowel: Stomach is within normal limits. No evidence of bowel wall  thickening, distention, or inflammatory changes. There are scattered colonic diverticula. Duodenal diverticulum is present. The appendix is not visualized. Vascular/Lymphatic: Aortic atherosclerosis. No enlarged abdominal or pelvic lymph nodes. Reproductive: Prostate gland is significantly enlarged. Other: There is presacral edema. There is no ascites or free air. There is no focal abdominal wall hernia or hematoma. Musculoskeletal: No acute fractures are seen. IMPRESSION: 1. No acute posttraumatic sequelae in the chest, abdomen or pelvis. 2. Diffuse bladder wall thickening with surrounding inflammatory stranding and findings suspicious for pneumatosis of the bladder wall. 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 3. Mild bilateral hydroureteronephrosis to the level of the bladder likely related to bladder process above. No urinary tract calculi are seen. Follow-up recommended. 4. Significant prostatomegaly. 5. Cholelithiasis. 6. Colonic diverticulosis. Aortic Atherosclerosis (ICD10-I70.0) and Emphysema (ICD10-J43.9). Electronically Signed   By: Greig Pique M.D.   On: 03/13/2024 15:55   CT HEAD WO CONTRAST Result Date: 03/13/2024 CLINICAL DATA:  Head trauma, moderate-severe; Polytrauma, blunt EXAM: CT HEAD WITHOUT CONTRAST CT CERVICAL SPINE WITHOUT CONTRAST TECHNIQUE: Multidetector CT imaging of the head and cervical spine was performed following the standard protocol without intravenous contrast. Multiplanar CT image reconstructions of the cervical spine were also generated. RADIATION DOSE REDUCTION: This exam was performed according to the departmental dose-optimization program which includes automated exposure control, adjustment of the mA and/or kV according to patient size and/or use of iterative reconstruction technique. COMPARISON:  None Available. FINDINGS: CT HEAD FINDINGS Brain: No evidence of acute infarction, hemorrhage, hydrocephalus, extra-axial collection or mass lesion/mass effect.  Vascular: Calcific atherosclerosis.  No hyperdense vessel. Skull: No acute fracture. Sinuses/Orbits: Opacified left sphenoid sinus. No acute orbital findings. Other: No mastoid effusions. CT CERVICAL SPINE FINDINGS Alignment: Mild anterolisthesis of C4 on C5, most likely degenerative. Skull base and vertebrae: No evidence of acute fracture. Soft tissues and spinal canal: No prevertebral fluid or swelling. No visible canal hematoma. Disc levels: Mild-to-moderate multilevel degenerative change. Bony fusion at C2-C3 posteriorly. Upper chest: Visualized lung apices are clear. IMPRESSION: 1. No evidence of acute intracranial abnormality. 2. No evidence of acute fracture or traumatic malalignment in the cervical spine. 3. Opacified left sphenoid sinus. Electronically Signed   By: Gilmore GORMAN Molt M.D.   On: 03/13/2024 15:53   CT CERVICAL SPINE WO CONTRAST Result Date: 03/13/2024 CLINICAL DATA:  Head trauma, moderate-severe; Polytrauma, blunt EXAM: CT HEAD WITHOUT CONTRAST CT CERVICAL SPINE WITHOUT CONTRAST TECHNIQUE: Multidetector CT imaging of the head and cervical spine was performed following the standard protocol without intravenous contrast. Multiplanar CT image reconstructions of the cervical spine were also generated. RADIATION DOSE REDUCTION: This exam was performed according to the departmental dose-optimization program which includes automated exposure control, adjustment of the mA and/or kV according to patient size and/or use of iterative reconstruction technique. COMPARISON:  None Available. FINDINGS: CT HEAD FINDINGS Brain: No evidence of acute infarction, hemorrhage, hydrocephalus, extra-axial collection or mass lesion/mass effect. Vascular: Calcific atherosclerosis.  No hyperdense vessel. Skull: No acute fracture. Sinuses/Orbits: Opacified left sphenoid sinus. No acute orbital findings. Other: No mastoid effusions. CT CERVICAL SPINE FINDINGS Alignment: Mild anterolisthesis of C4 on C5, most likely  degenerative. Skull base and vertebrae: No evidence of acute fracture. Soft tissues and spinal canal: No prevertebral fluid or swelling. No visible canal hematoma. Disc levels: Mild-to-moderate multilevel degenerative change.  Bony fusion at C2-C3 posteriorly. Upper chest: Visualized lung apices are clear. IMPRESSION: 1. No evidence of acute intracranial abnormality. 2. No evidence of acute fracture or traumatic malalignment in the cervical spine. 3. Opacified left sphenoid sinus. Electronically Signed   By: Gilmore GORMAN Molt M.D.   On: 03/13/2024 15:53   DG Pelvis Portable Result Date: 03/13/2024 CLINICAL DATA:  Status post fall with right hip pain. EXAM: PORTABLE PELVIS 1 VIEWS COMPARISON:  None Available. FINDINGS: There is no evidence of acute displaced pelvic fracture or diastasis. No pelvic bone lesions are seen. Mild degenerative changes of bilateral hips. Vascular calcifications. IMPRESSION: 1. No acute displaced pelvic fracture or diastasis. 2. Mild degenerative changes of bilateral hips. Electronically Signed   By: Limin  Xu M.D.   On: 03/13/2024 15:16   DG Chest Port 1 View Result Date: 03/13/2024 CLINICAL DATA:  Status post fall EXAM: PORTABLE CHEST 1 VIEW COMPARISON:  Chest radiograph dated 01/27/2024 FINDINGS: Normal lung volumes. No focal consolidations. No pleural effusion or pneumothorax. The heart size and mediastinal contours are within normal limits. No acute osseous abnormality. IMPRESSION: No acute disease. Electronically Signed   By: Limin  Xu M.D.   On: 03/13/2024 15:14     Procedures   Medications Ordered in the ED  aspirin  EC tablet 81 mg (has no administration in time range)  atorvastatin  (LIPITOR) tablet 10 mg (has no administration in time range)  levothyroxine  (SYNTHROID ) tablet 88 mcg (has no administration in time range)  finasteride  (PROSCAR ) tablet 5 mg (has no administration in time range)  tamsulosin  (FLOMAX ) capsule 0.4 mg (has no administration in time range)   heparin  injection 5,000 Units (has no administration in time range)  sodium chloride  flush (NS) 0.9 % injection 3 mL (has no administration in time range)  acetaminophen  (TYLENOL ) tablet 650 mg (has no administration in time range)    Or  acetaminophen  (TYLENOL ) suppository 650 mg (has no administration in time range)  insulin  aspart (novoLOG ) injection 0-9 Units (2 Units Subcutaneous Given 03/13/24 2058)  ceFEPIme  (MAXIPIME ) 1 g in sodium chloride  0.9 % 100 mL IVPB (has no administration in time range)  lactated ringers  bolus 1,000 mL (1,000 mLs Intravenous New Bag/Given 03/13/24 1502)  ceFEPIme  (MAXIPIME ) 1 g in sodium chloride  0.9 % 100 mL IVPB (0 g Intravenous Stopped 03/13/24 1806)  sodium chloride  0.9 % bolus 1,000 mL (1,000 mLs Intravenous New Bag/Given 03/13/24 1734)  insulin  aspart (novoLOG ) injection 5 Units (5 Units Intravenous Given 03/13/24 1558)  albuterol  (PROVENTIL ) (2.5 MG/3ML) 0.083% nebulizer solution 2.5 mg (2.5 mg Nebulization Given 03/13/24 1557)  sodium zirconium cyclosilicate  (LOKELMA ) packet 10 g (10 g Oral Given 03/13/24 1558)                                    Medical Decision Making Amount and/or Complexity of Data Reviewed Labs: ordered. Radiology: ordered.  Risk OTC drugs. Prescription drug management. Decision regarding hospitalization.   This patient presents to the ED for concern of fall, generalized weakness, this involves an extensive number of treatment options, and is a complaint that carries with it a high risk of complications and morbidity.  The differential diagnosis includes infection, dehydration, rhabdomyolysis, metabolic derangements, syncope, CVA, ACS   Co morbidities / Chronic conditions that complicate the patient evaluation  DM, BPH, HLD, CVA, CKD   Additional history obtained:  Additional history obtained from EMR External records from outside source obtained and  reviewed including EMS   Lab Tests:  I Ordered, and personally  interpreted labs.  The pertinent results include: Large leukocytosis, slight elevation in troponin, acute renal failure with hyperkalemia, hyperglycemia, metabolic acidosis, azotemia.  Urinalysis consistent with UTI.   Imaging Studies ordered:  I ordered imaging studies including x-ray of chest and pelvis, CT scan of head, cervical spine, chest, abdomen, pelvis I independently visualized and interpreted imaging which showed findings consistent with emphysematous cystitis, mild bilateral hydroureteronephrosis consistent with postobstructive uropathy I agree with the radiologist interpretation   Cardiac Monitoring: / EKG:  The patient was maintained on a cardiac monitor.  I personally viewed and interpreted the cardiac monitored which showed an underlying rhythm of: Sinus rhythm   Problem List / ED Course / Critical interventions / Medication management  Patient presents after fall and prolonged downtime on floor at home.  EMS noted hypoxia, hypertension, and hyperglycemia prior to arrival.  On arrival, patient is awake and alert.  He currently has no physical complaints.  He has no focal neurologic deficits.  Her breathing is unlabored.  He has an indwelling Foley catheter that has not had any drainage in the past 24 hours.  Foley catheter tubing is noted to be torn.  Despite this, I would expect some urine to be in the bag.  Replacement Foley was ordered.  When replacement Foley was placed, he had 700 cc of brown cloudy output.  Cefepime  was ordered for empiric treatment of UTI.  I-STAT shows concern of acute renal failure with hyperkalemia.  Temporizing medications were ordered in addition to additional IV fluids.  CT imaging showed findings worrisome for emphysematous cystitis.  He has bilateral hydroureteronephrosis without any urinary tract calculi.  I suspect hydro is secondary to postobstructive uropathy.  On reassessment, patient remains pain-free.  His family is at bedside.  They feel that  he is close to his normal mentation.  Patient was admitted for further management.  Hospitalist requested urology consult.  I spoke with urologist on-call, Dr. Nieves, who does not feel that any further management is needed for his emphysematous cystitis.  Primary treatment would be drainage of the bladder which has been achieved with new Foley catheter. I ordered medication including cefepime  for UTI; IV fluids for hydration; additional fluids, insulin , Lokelma , albuterol  for hyperkalemia Reevaluation of the patient after these medicines showed that the patient improved I have reviewed the patients home medicines and have made adjustments as needed   Consultations Obtained:  I requested consultation with the nephrologist, Dr. Prescilla,  and discussed lab and imaging findings as well as pertinent plan - they recommend: Will see in consult I requested consultation with the urologist, Dr. Nieves,  and discussed lab and imaging findings as well as pertinent plan - they recommend: Foley catheter, antibiotics, and supportive care.  No further interventions required by urology.   Social Determinants of Health:  Lives independently, short-term memory loss at baseline   CRITICAL CARE Performed by: Bernardino Fireman   Total critical care time: 34 minutes  Critical care time was exclusive of separately billable procedures and treating other patients.  Critical care was necessary to treat or prevent imminent or life-threatening deterioration.  Critical care was time spent personally by me on the following activities: development of treatment plan with patient and/or surrogate as well as nursing, discussions with consultants, evaluation of patient's response to treatment, examination of patient, obtaining history from patient or surrogate, ordering and performing treatments and interventions, ordering and review of laboratory studies,  ordering and review of radiographic studies, pulse oximetry and  re-evaluation of patient's condition.     Final diagnoses:  Fall, initial encounter  Emphysematous cystitis  Acute renal failure, unspecified acute renal failure type (HCC)  Hyperkalemia  Elevated CK  Azotemia  Urine retention    ED Discharge Orders     None          Melvenia Motto, MD 03/13/24 2108

## 2024-03-13 NOTE — ED Notes (Signed)
 MD notified of I-stat chem 8

## 2024-03-13 NOTE — ED Triage Notes (Addendum)
 Pt here  via [facility] from home where he lives by himself in [place].  Family called this am and pt did not answer.  Family went to his apartment at 1130 and found him on the floor, laying at the foot of the bed.  Pt states that he did not  hit his head or lose consciousness, but he thinks he fell yesterday at 2 pm.    Pt denies pain. AO x 4 presently, though family states he has short term memory loss.  No obvious signs of injury.

## 2024-03-13 NOTE — Consult Note (Signed)
 [facility] Consultation Note  Requesting MD: Seena  Indication for Consultation: A on CRF-  hyperkalemia  HPI:  RESEAN Butler is a 89 y.o. male with HTN, DM, hx of CVA and CKD 3B ( crt around 2 at baseline) but pt was unaware of this diagnosis.  He had a recent hospitalization in May for appendicitis managed conservatively - that hospitalization was complicated by BOO and chronic indwelling foley was placed-  discharge crt on that stay was 2.28 on 01/30/24.  Patient returned to stay at home.  He lives alone and family just checks in on him.  He was seen Thursday doing OK per the family -  not seen on Friday.  Today he was found down by family .  Labs are remarkable for BUN and crt 163 and 9.3-  K of 6.9 and sodium of 125. Chronic indwelling foley did not have any UOP-  foley taken out and replaced with immediate return of 700 of urine, total now 1200.  He is in NAD-  cannot answer my questions-  does not have any recollection of recent events.  BP has been soft. CK taken later was 1000-  pt has been started on high dose IVF and also received short term treatment for K ( insulin , albuterol , lokelma -  repeat K is pending.  Urine is turbid with TNTC RBC and WBC.  Imaging shows mild bilat hydroureteronephrosis, diffuse bladder wall thickening- ? Pneumotosis of bladder wall-  has been started on maxepime   Creatinine, Ser  Date/Time Value Ref Range Status  03/13/2024 03:16 PM 10.00 (H) 0.61 - 1.24 mg/dL Final  93/78/7974 97:49 PM 9.34 (H) 0.61 - 1.24 mg/dL Final  94/90/7974 88:90 AM 2.28 (H) 0.61 - 1.24 mg/dL Final  94/90/7974 95:41 AM 2.41 (H) 0.61 - 1.24 mg/dL Final  94/91/7974 94:74 AM 2.13 (H) 0.61 - 1.24 mg/dL Final  94/92/7974 87:88 AM 2.01 (H) 0.61 - 1.24 mg/dL Final  94/93/7974 95:88 PM 2.18 (H) 0.61 - 1.24 mg/dL Final  92/72/7975 93:67 AM 1.90 (H) 0.61 - 1.24 mg/dL Final  92/73/7975 95:58 AM 1.78 (H) 0.61 - 1.24 mg/dL Final  92/74/7975 95:52 AM 1.92 (H) 0.61 - 1.24  mg/dL Final  92/75/7975 92:93 PM 2.07 (H) 0.61 - 1.24 mg/dL Final  92/75/7975 98:56 PM 2.30 (H) 0.61 - 1.24 mg/dL Final  92/75/7975 98:84 PM 2.30 (H) 0.61 - 1.24 mg/dL Final     PMHx:   Past Medical History:  Diagnosis Date   Diabetes mellitus without complication (HCC)    Enlarged prostate    High cholesterol    Thyroid  disease     Past Surgical History:  Procedure Laterality Date   pituitory       Family Hx:  Family History  Problem Relation Age of Onset   Heart disease Mother    Eczema Father    COPD Sister    Colon cancer Brother    Alcohol  abuse Brother    Alcohol  abuse Brother     Social History:  reports that he has never smoked. He has never used smokeless tobacco. He reports that he does not currently use alcohol . He reports that he does not use drugs.  Allergies:  Allergies  Allergen Reactions   Sulfa Antibiotics Nausea And Vomiting    Medications: Prior to Admission medications   Medication Sig Start Date End Date Taking? Authorizing Provider  aspirin  EC 81 MG tablet Take 81 mg by mouth daily. 08/26/13  Yes [provider]  atorvastatin  (  LIPITOR) 20 MG tablet Take 20 mg by mouth daily.   Yes [provider]  cyanocobalamin (VITAMIN B12) 500 MCG tablet Take 500 mcg by mouth daily.   Yes [provider]  finasteride  (PROSCAR ) 5 MG tablet Take 5 mg by mouth daily. 08/02/09  Yes [provider]  glipiZIDE  (GLUCOTROL ) 5 MG tablet Take 5 mg by mouth 2 (two) times daily before a meal.   Yes [provider]  levothyroxine  (SYNTHROID ) 88 MCG tablet Take 88 mcg by mouth daily before breakfast. 12/04/22  Yes [provider]  Multiple Vitamin (MULTIVITAMIN WITH MINERALS) TABS tablet Take 1 tablet by mouth daily.   Yes [provider]  omega-3 acid ethyl esters (LOVAZA) 1 g capsule Take 1 g by mouth daily.   Yes [provider]  pioglitazone (ACTOS) 45 MG tablet Take 45 mg by mouth daily.   Yes  [provider]  Saw Palmetto, Serenoa repens, (SAW PALMETTO PO) Take 1 capsule by mouth daily.   Yes [provider]  SITagliptin 25 MG TABS Take 1 tablet by mouth daily.   Yes [provider]  Testosterone  Cypionate 200 MG/ML SOSY Inject 200 mg into the muscle every 21 ( twenty-one) days.   Yes [provider]  polyethylene glycol (MIRALAX  / GLYCOLAX ) 17 g packet Take 17 g by mouth daily. Patient not taking: Reported on 03/13/2024 04/20/23   Dorinda Drue DASEN, MD  tamsulosin  (FLOMAX ) 0.4 MG CAPS capsule Take 0.4 mg by mouth daily. Patient not taking: Reported on 03/13/2024 03/22/20   [provider]    I have reviewed the patient's current medications.  Labs:  Results for orders placed or performed during the hospital encounter of 03/13/24 (from the past 48 hours)  Comprehensive metabolic panel     Status: Abnormal   Collection Time: 03/13/24  2:50 PM  Result Value Ref Range   Sodium 125 (L) 135 - 145 mmol/L   Potassium 6.9 (HH) 3.5 - 5.1 mmol/L    Comment: CRITICAL RESULT CALLED TO, READ BACK BY AND VERIFIED WITH MICHELSON . B RN @1607  03/13/24 S OUROBANGNA   Chloride 89 (L) 98 - 111 mmol/L   CO2 15 (L) 22 - 32 mmol/L   Glucose, Bld 352 (H) 70 - 99 mg/dL    Comment: Glucose reference range applies only to samples taken after fasting for at least 8 hours.   BUN 163 (H) 8 - 23 mg/dL   Creatinine, Ser 0.65 (H) 0.61 - 1.24 mg/dL   Calcium  8.2 (L) 8.9 - 10.3 mg/dL   Total Protein 7.0 6.5 - 8.1 g/dL   Albumin 2.4 (L) 3.5 - 5.0 g/dL   AST 32 15 - 41 U/L   ALT 21 0 - 44 U/L   Alkaline Phosphatase 102 38 - 126 U/L   Total Bilirubin 0.9 0.0 - 1.2 mg/dL   GFR, Estimated 5 (L) >60 mL/min    Comment: (NOTE) Calculated using the CKD-EPI Creatinine Equation (2021)    Anion gap 21 (H) 5 - 15    Comment: ELECTROLYTES REPEATED TO VERIFY Performed at [facility], [address] [place], [place] [address]   CBC     Status: Abnormal   Collection  Time: 03/13/24  2:50 PM  Result Value Ref Range   WBC 25.5 (H) 4.0 - 10.5 K/uL   RBC 3.85 (L) 4.22 - 5.81 MIL/uL   Hemoglobin 11.0 (L) 13.0 - 17.0 g/dL   HCT 66.7 (L) 60.9 - 47.9 %  MCV 86.2 80.0 - 100.0 fL   MCH 28.6 26.0 - 34.0 pg   MCHC 33.1 30.0 - 36.0 g/dL   RDW 85.3 88.4 - 84.4 %   Platelets 332 150 - 400 K/uL   nRBC 0.0 0.0 - 0.2 %    Comment: Performed at [facility], [address], [place], [place] [address]  Protime-INR     Status: None   Collection Time: 03/13/24  2:50 PM  Result Value Ref Range   Prothrombin Time 14.8 11.4 - 15.2 seconds   INR 1.1 0.8 - 1.2    Comment: (NOTE) INR goal varies based on device and disease states. Performed at [facility], [address] [place], [place] [address]   Urinalysis, w/ Reflex to Culture (Infection Suspected) -Urine, Catheterized; Indwelling urinary catheter     Status: Abnormal   Collection Time: 03/13/24  2:50 PM  Result Value Ref Range   Specimen Source URINE, CLEAN CATCH    Color, Urine YELLOW YELLOW   APPearance TURBID (A) CLEAR   Specific Gravity, Urine 1.010 1.005 - 1.030   pH 6.0 5.0 - 8.0   Glucose, UA 50 (A) NEGATIVE mg/dL   Hgb urine dipstick LARGE (A) NEGATIVE   Bilirubin Urine NEGATIVE NEGATIVE   Ketones, ur NEGATIVE NEGATIVE mg/dL   Protein, ur 899 (A) NEGATIVE mg/dL   Nitrite NEGATIVE NEGATIVE   Leukocytes,Ua MODERATE (A) NEGATIVE   RBC / HPF >50 0 - 5 RBC/hpf   WBC, UA >50 0 - 5 WBC/hpf    Comment:        Reflex urine culture not performed if WBC <=10, OR if Squamous epithelial cells >5. If Squamous epithelial cells >5 suggest recollection.    Bacteria, UA MANY (A) NONE SEEN   Squamous Epithelial / HPF 0-5 0 - 5 /HPF   WBC Clumps PRESENT     Comment: Performed at [facility], [address] [place], [place] [address]  Magnesium     Status: None   Collection Time: 03/13/24  2:50 PM  Result Value Ref Range   Magnesium 2.3 1.7 - 2.4 mg/dL    Comment: Performed at [facility], [address] [place], [place] [address]  CK     Status: Abnormal   Collection Time: 03/13/24  2:50 PM  Result Value Ref Range   Total CK 1,014 (H) 49 - 397 U/L    Comment: Performed at [facility], [address], [place], [place] [address]  Troponin I (High Sensitivity)     Status: Abnormal   Collection Time: 03/13/24  2:50 PM  Result Value Ref Range   Troponin I (High Sensitivity) 27 (H) <18 ng/L    Comment: (NOTE) Elevated high sensitivity troponin I (hsTnI) values and significant  changes across serial measurements may suggest ACS but many other  chronic and acute conditions are known to elevate hsTnI results.  Refer to the Links section for chest pain algorithms and additional  guidance. Performed at [facility], [address], [place], [place] [address]   Osmolality     Status: Abnormal   Collection Time: 03/13/24  2:50 PM  Result Value Ref Range   Osmolality 340 (HH) 275 - 295 mOsm/kg    Comment: REPEATED TO VERIFY CRITICAL RESULT CALLED TO, READ BACK BY AND VERIFIED WITH: ERMINIO HORDE, RN AT 1605 ON 03/13/2024 BY PWSLOAN Performed at [facility], [address], [place], [place] [address]  Beta-hydroxybutyric acid     Status: Abnormal   Collection Time: 03/13/24  2:50 PM  Result Value Ref Range   Beta-Hydroxybutyric Acid 0.34 (H) 0.05 - 0.27 mmol/L    Comment: Performed at [facility], [address], [place], [place] [address]  D-dimer, quantitative     Status: Abnormal   Collection Time: 03/13/24  2:50 PM  Result Value Ref Range   D-Dimer, Quant >20.00 (H) 0.00 - 0.50 ug/mL-FEU    Comment: REPEATED TO VERIFY (NOTE) At the manufacturer cut-off value of 0.5 g/mL FEU, this assay has a negative predictive value of 95-100%.This assay is intended for use in conjunction with a clinical pretest probability (PTP) assessment model to exclude pulmonary embolism (PE) and deep venous thrombosis (DVT) in outpatients suspected of  PE or DVT. Results should be correlated with clinical presentation. Performed at [facility], [address] [place], [place] [address]   I-Stat Chem 8, ED     Status: Abnormal   Collection Time: 03/13/24  3:16 PM  Result Value Ref Range   Sodium 123 (L) 135 - 145 mmol/L   Potassium 6.9 (HH) 3.5 - 5.1 mmol/L   Chloride 93 (L) 98 - 111 mmol/L   BUN >130 (H) 8 - 23 mg/dL   Creatinine, Ser 89.99 (H) 0.61 - 1.24 mg/dL   Glucose, Bld 651 (H) 70 - 99 mg/dL    Comment: Glucose reference range applies only to samples taken after fasting for at least 8 hours.   Calcium , Ion 0.96 (L) 1.15 - 1.40 mmol/L   TCO2 18 (L) 22 - 32 mmol/L   Hemoglobin 11.9 (L) 13.0 - 17.0 g/dL   HCT 64.9 (L) 60.9 - 47.9 %   Comment NOTIFIED PHYSICIAN   I-Stat Lactic Acid, ED     Status: None   Collection Time: 03/13/24  3:17 PM  Result Value Ref Range   Lactic Acid, Venous 1.9 0.5 - 1.9 mmol/L  CBG monitoring, ED     Status: Abnormal   Collection Time: 03/13/24  5:51 PM  Result Value Ref Range   Glucose-Capillary 226 (H) 70 - 99 mg/dL    Comment: Glucose reference range applies only to samples taken after fasting for at least 8 hours.     ROS:  Review of systems not obtained due to patient factors.  Physical Exam: Vitals:   03/13/24 1426  BP: (!) 167/90  Pulse: 99  Resp: 20  Temp: 97.7 F (36.5 C)  SpO2: 100%     General: well appearing , pleasantly confused, NAD-  family at bedside  HEENT: PERRLA, EOMI. Mucous membranes dry  Neck: no JVD Heart: RRR Lungs: mostly clear Abdomen: soft, non tender Extremities: no edema -  no obvious bruising to my exam Skin:warm and dry Neuro: alert, confused  Assessment/Plan: 89 year old WM with HTN, DM and CKD.  Now presents with A on CRF in the setting of fall with prolonged immobility and acute bladder outlet obstruction 1.Renal- has fairly advanced CKD at baseline. Now with AKI and element of rhabdo, UTI and BOO.  Foley has been replaced ans he is  nonoliguric, antibiotics as well as high dose IVF being given.   I hope we will be able to achieve improvement in renal function for this nice man.  If this does not improve willl be difficult as I would not consider a 89 year old with new failure to thrive to be a good dialysis candidate 2. Hypertension/volume  - not  overloaded-  getting IVF 3. Hyperkalemia-  quite severe-  has received short term meds- awaiting recheck-  will continue to treat medically as needed  4. Anemia  - not a significant issue at this time-  will likely go down with hydration 5. 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

## 2024-03-13 NOTE — H&P (Addendum)
 History and Physical   Jorge Butler:[id number] DOB: [birth year] DOA: 03/13/2024  PCP: Onita Rush, MD   Patient coming from: Home  Chief Complaint: Fall. Found down.  HPI: Jorge Butler is a 89 y.o. male with medical history significant of hypertension, hyperlipidemia, diabetes, CKD 3B, CVA, hypothyroidism, BPH, Foley catheter, glaucoma, pituitary adenoma s/p resection in 1991 presenting after fall.  Patient is still independent and lives alone.  Family does visit him every day to check on him.  Reportedly fell yesterday.  Family last saw him late in the morning and patient thinks he fell around 2 PM.  Was unable to get up and was still on the ground when family came to check on him around noon today.  On the ground for approximately 24 hours in that case.  Patient was admitted a month and a half ago for right lower quadrant pain and found to have CT with a mass treated conservatively for possible perforated appendicitis with IV fluids and antibiotics, recovered with this treatment.  Had urinary retention while admitted and failed voiding trial.  Foley catheter placed with plan for urology follow-up and patient started on tamsulosin  in addition to finasteride .  Family has been emptying patient's Foley bag and today they noted that it was empty.  Patient denies fevers, chills, chest pain, shortness of breath, abdominal pain, constipation, diarrhea, nausea, vomiting.  ED Course: Vital signs in the ED notable for blood pressure in the 120s-160s systolic. Lab workup significant for CMP with sodium 125, potassium 6.9, chloride 89, bicarb 15 with a gap of 21, BUN 163, creatinine 9.34 from baseline 2.2, glucose 352, calcium  8.2, albumin 3.4.  CBC with leukocytosis to 25.5, during admission a month and a half ago he had leukocytosis around 20 and there is no record of this return to baseline, hemoglobin stable at 11.  PT and INR normal.  Magnesium normal.  CK elevated to 1000.  Troponin 27  with repeat pending.  Lactic acid normal.  BNP pending.  Urinalysis with glucose, hemoglobin, protein, leukocytes.  Urine culture pending.  Beta hydroxybutyric acid mildly elevated at 0.34.  Serum osmolality elevated at 340.  Imaging studies included chest x-ray which showed no acute abnormality, pelvis x-ray which showed no acute normality.  CT head showed no acute abnormality, CT C-spine showed no acute abnormality.  CT of the chest abdomen pelvis showed bladder wall thickening with surrounding inflammatory stranding suspicious for pneumatosis concerning for emphysematous cystitis, also noted was mild bilateral hydroureteronephrosis and prostatic megaly.  Patient received cefepime , insulin , albuterol , Lokelma , 2 L IV fluid in the ED.  Nephrology was consulted for assistance and will see the patient.  Urology consulted and EDP waiting to hear back.  Review of Systems: As per HPI otherwise all other systems reviewed and are negative.  Past Medical History:  Diagnosis Date   Diabetes mellitus without complication (HCC)    Enlarged prostate    High cholesterol    Thyroid  disease     Past Surgical History:  Procedure Laterality Date   pituitory       Social History  reports that he has never smoked. He has never used smokeless tobacco. He reports that he does not currently use alcohol . He reports that he does not use drugs.  Allergies  Allergen Reactions   Sulfa Antibiotics Nausea And Vomiting    Family History  Problem Relation Age of Onset   Heart disease Mother    Eczema Father    COPD Sister  Colon cancer Brother    Alcohol  abuse Brother    Alcohol  abuse Brother   Reviewed on admission  Prior to Admission medications   Medication Sig Start Date End Date Taking? Authorizing Provider  aspirin  EC 81 MG tablet Take 81 mg by mouth daily. 08/26/13   [provider]  atorvastatin  (LIPITOR) 20 MG tablet Take 10 mg by mouth daily.    [provider]   cyanocobalamin (VITAMIN B12) 500 MCG tablet Take 500 mcg by mouth daily.    [provider]  finasteride  (PROSCAR ) 5 MG tablet Take 5 mg by mouth daily. 08/02/09   [provider]  levothyroxine  (SYNTHROID ) 88 MCG tablet Take 88 mcg by mouth daily before breakfast. 12/04/22   [provider]  Multiple Vitamin (MULTIVITAMIN WITH MINERALS) TABS tablet Take 1 tablet by mouth daily.    [provider]  omega-3 acid ethyl esters (LOVAZA) 1 g capsule Take 1 g by mouth daily.    [provider]  polyethylene glycol (MIRALAX  / GLYCOLAX ) 17 g packet Take 17 g by mouth daily. Patient taking differently: Take 17 g by mouth daily as needed for mild constipation, moderate constipation or severe constipation. 04/20/23   Dorinda Drue DASEN, MD  SITagliptin 25 MG TABS Take 1 tablet by mouth daily.    [provider]  tamsulosin  (FLOMAX ) 0.4 MG CAPS capsule Take 0.4 mg by mouth daily. 03/22/20   [provider]  Testosterone  Cypionate 200 MG/ML SOSY Inject 200 mg into the muscle every 21 ( twenty-one) days.    [provider]    Physical Exam: Vitals:   03/13/24 1426 03/13/24 1509  BP: (!) 167/90   Pulse: 99   Resp: 20   Temp: 97.7 F (36.5 C)   SpO2: 100%   Weight:  75.2 kg  Height:  5' 10 (1.778 m)    Physical Exam Constitutional:      General: He is not in acute distress.    Appearance: Normal appearance.  HENT:     Head: Normocephalic and atraumatic.     Mouth/Throat:     Mouth: Mucous membranes are moist.     Pharynx: Oropharynx is clear.   Eyes:     Extraocular Movements: Extraocular movements intact.     Pupils: Pupils are equal, round, and reactive to light.    Cardiovascular:     Rate and Rhythm: Normal rate and regular rhythm.     Pulses: Normal pulses.     Heart sounds: Normal heart sounds.  Pulmonary:     Effort: Pulmonary effort is normal. No respiratory distress.     Breath sounds: Normal breath sounds.   Abdominal:     General: Bowel sounds are normal. There is no distension.     Palpations: Abdomen is soft.     Tenderness: There is no abdominal tenderness.   Musculoskeletal:        General: No swelling or deformity.   Skin:    General: Skin is warm and dry.   Neurological:     General: No focal deficit present.     Comments: Confused at times   Labs on Admission: I have personally reviewed following labs and imaging studies  CBC: Recent Labs  Lab 03/13/24 1450 03/13/24 1516  WBC 25.5*  --   HGB 11.0* 11.9*  HCT 33.2* 35.0*  MCV 86.2  --   PLT 332  --     Basic Metabolic Panel: Recent Labs  Lab 03/13/24 1450 03/13/24 1516  NA 125* 123*  K 6.9* 6.9*  CL 89* 93*  CO2 15*  --   GLUCOSE 352* 348*  BUN 163* >130*  CREATININE 9.34* 10.00*  CALCIUM  8.2*  --   MG 2.3  --     GFR: Estimated Creatinine Clearance: 5.1 mL/min (A) (by C-G formula based on SCr of 10 mg/dL (H)).  Liver Function Tests: Recent Labs  Lab 03/13/24 1450  AST 32  ALT 21  ALKPHOS 102  BILITOT 0.9  PROT 7.0  ALBUMIN 2.4*    Urine analysis:    Component Value Date/Time   COLORURINE YELLOW 03/13/2024 1450   APPEARANCEUR TURBID (A) 03/13/2024 1450   LABSPEC 1.010 03/13/2024 1450   PHURINE 6.0 03/13/2024 1450   GLUCOSEU 50 (A) 03/13/2024 1450   HGBUR LARGE (A) 03/13/2024 1450   BILIRUBINUR NEGATIVE 03/13/2024 1450   BILIRUBINUR negative 04/16/2023 1130   KETONESUR NEGATIVE 03/13/2024 1450   PROTEINUR 100 (A) 03/13/2024 1450   UROBILINOGEN 0.2 04/16/2023 1130   NITRITE NEGATIVE 03/13/2024 1450   LEUKOCYTESUR MODERATE (A) 03/13/2024 1450    Radiological Exams on Admission: CT CHEST ABDOMEN PELVIS WO CONTRAST Result Date: 03/13/2024 CLINICAL DATA:  Trauma EXAM: CT CHEST, ABDOMEN AND PELVIS WITHOUT CONTRAST TECHNIQUE: Multidetector CT imaging of the chest, abdomen and pelvis was performed following the standard protocol without IV contrast. RADIATION DOSE REDUCTION: This exam was  performed according to the departmental dose-optimization program which includes automated exposure control, adjustment of the mA and/or kV according to patient size and/or use of iterative reconstruction technique. COMPARISON:  CT abdomen and pelvis 01/27/2024 FINDINGS: CT CHEST FINDINGS Cardiovascular: No significant vascular findings. Normal heart size. No pericardial effusion. Mediastinum/Nodes: No enlarged mediastinal, hilar, or axillary lymph nodes. Thyroid  gland, trachea, and esophagus demonstrate no significant findings. Lungs/Pleura: There is linear atelectasis in the bilateral lower lobes. The lungs are otherwise clear. There is no pleural effusion or pneumothorax. Musculoskeletal: No acute fractures are seen. CT ABDOMEN PELVIS FINDINGS Hepatobiliary: Gallstones are present. Liver is within normal limits. There is no biliary ductal dilatation. Pancreas: Unremarkable. No pancreatic ductal dilatation or surrounding inflammatory changes. Spleen: Normal in size without focal abnormality. Adrenals/Urinary Tract: There is mild bilateral hydroureteronephrosis to the level of the bladder. No urinary tract calculi are seen. The kidneys are otherwise within normal limits. The adrenal glands are within normal limits. There is diffuse bladder wall thickening. The bladder is decompressed by Foley catheter. There are findings suspicious for pneumatosis of the bladder wall there is inflammatory stranding surrounding the bladder. Stomach/Bowel: Stomach is within normal limits. No evidence of bowel wall thickening, distention, or inflammatory changes. There are scattered colonic diverticula. Duodenal diverticulum is present. The appendix is not visualized. Vascular/Lymphatic: Aortic atherosclerosis. No enlarged abdominal or pelvic lymph nodes. Reproductive: Prostate gland is significantly enlarged. Other: There is presacral edema. There is no ascites or free air. There is no focal abdominal wall hernia or hematoma.  Musculoskeletal: No acute fractures are seen. IMPRESSION: 1. No acute posttraumatic sequelae in the chest, abdomen or pelvis. 2. Diffuse bladder wall thickening with surrounding inflammatory stranding and findings suspicious for pneumatosis of the bladder wall. 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 3. Mild bilateral hydroureteronephrosis to the level of the bladder likely related to bladder process above. No urinary tract calculi are seen. Follow-up recommended. 4. Significant prostatomegaly. 5. Cholelithiasis. 6. Colonic diverticulosis. Aortic Atherosclerosis (ICD10-I70.0) and Emphysema (ICD10-J43.9). Electronically Signed   By: Greig Pique M.D.   On: 03/13/2024 15:55   CT HEAD WO CONTRAST  Result Date: 03/13/2024 CLINICAL DATA:  Head trauma, moderate-severe; Polytrauma, blunt EXAM: CT HEAD WITHOUT CONTRAST CT CERVICAL SPINE WITHOUT CONTRAST TECHNIQUE: Multidetector CT imaging of the head and cervical spine was performed following the standard protocol without intravenous contrast. Multiplanar CT image reconstructions of the cervical spine were also generated. RADIATION DOSE REDUCTION: This exam was performed according to the departmental dose-optimization program which includes automated exposure control, adjustment of the mA and/or kV according to patient size and/or use of iterative reconstruction technique. COMPARISON:  None Available. FINDINGS: CT HEAD FINDINGS Brain: No evidence of acute infarction, hemorrhage, hydrocephalus, extra-axial collection or mass lesion/mass effect. Vascular: Calcific atherosclerosis.  No hyperdense vessel. Skull: No acute fracture. Sinuses/Orbits: Opacified left sphenoid sinus. No acute orbital findings. Other: No mastoid effusions. CT CERVICAL SPINE FINDINGS Alignment: Mild anterolisthesis of C4 on C5, most likely degenerative. Skull base and vertebrae: No evidence of acute fracture. Soft tissues and spinal canal: No prevertebral fluid or swelling. No visible  canal hematoma. Disc levels: Mild-to-moderate multilevel degenerative change. Bony fusion at C2-C3 posteriorly. Upper chest: Visualized lung apices are clear. IMPRESSION: 1. No evidence of acute intracranial abnormality. 2. No evidence of acute fracture or traumatic malalignment in the cervical spine. 3. Opacified left sphenoid sinus. Electronically Signed   By: Gilmore GORMAN Molt M.D.   On: 03/13/2024 15:53   CT CERVICAL SPINE WO CONTRAST Result Date: 03/13/2024 CLINICAL DATA:  Head trauma, moderate-severe; Polytrauma, blunt EXAM: CT HEAD WITHOUT CONTRAST CT CERVICAL SPINE WITHOUT CONTRAST TECHNIQUE: Multidetector CT imaging of the head and cervical spine was performed following the standard protocol without intravenous contrast. Multiplanar CT image reconstructions of the cervical spine were also generated. RADIATION DOSE REDUCTION: This exam was performed according to the departmental dose-optimization program which includes automated exposure control, adjustment of the mA and/or kV according to patient size and/or use of iterative reconstruction technique. COMPARISON:  None Available. FINDINGS: CT HEAD FINDINGS Brain: No evidence of acute infarction, hemorrhage, hydrocephalus, extra-axial collection or mass lesion/mass effect. Vascular: Calcific atherosclerosis.  No hyperdense vessel. Skull: No acute fracture. Sinuses/Orbits: Opacified left sphenoid sinus. No acute orbital findings. Other: No mastoid effusions. CT CERVICAL SPINE FINDINGS Alignment: Mild anterolisthesis of C4 on C5, most likely degenerative. Skull base and vertebrae: No evidence of acute fracture. Soft tissues and spinal canal: No prevertebral fluid or swelling. No visible canal hematoma. Disc levels: Mild-to-moderate multilevel degenerative change. Bony fusion at C2-C3 posteriorly. Upper chest: Visualized lung apices are clear. IMPRESSION: 1. No evidence of acute intracranial abnormality. 2. No evidence of acute fracture or traumatic  malalignment in the cervical spine. 3. Opacified left sphenoid sinus. Electronically Signed   By: Gilmore GORMAN Molt M.D.   On: 03/13/2024 15:53   DG Pelvis Portable Result Date: 03/13/2024 CLINICAL DATA:  Status post fall with right hip pain. EXAM: PORTABLE PELVIS 1 VIEWS COMPARISON:  None Available. FINDINGS: There is no evidence of acute displaced pelvic fracture or diastasis. No pelvic bone lesions are seen. Mild degenerative changes of bilateral hips. Vascular calcifications. IMPRESSION: 1. No acute displaced pelvic fracture or diastasis. 2. Mild degenerative changes of bilateral hips. Electronically Signed   By: Limin  Xu M.D.   On: 03/13/2024 15:16   DG Chest Port 1 View Result Date: 03/13/2024 CLINICAL DATA:  Status post fall EXAM: PORTABLE CHEST 1 VIEW COMPARISON:  Chest radiograph dated 01/27/2024 FINDINGS: Normal lung volumes. No focal consolidations. No pleural effusion or pneumothorax. The heart size and mediastinal contours are within normal limits. No acute osseous abnormality. IMPRESSION: No acute  disease. Electronically Signed   By: Limin  Xu M.D.   On: 03/13/2024 15:14   EKG: Ordered in ED but not yet performed/available for review  Assessment/Plan Principal Problem:   Emphysematous cystitis Active Problems:   Glaucoma   Hypertension   Acquired hypothyroidism   HLD (hyperlipidemia)   History of CVA (cerebrovascular accident)   Hyperkalemia   DM2 (diabetes mellitus, type 2) (HCC)   Uremia   Acute renal failure superimposed on stage 3b chronic kidney disease (HCC)   Hyponatremia  Acute on chronic leukocytosis Emphysematous cystitis > Noted to have leukocytes in urine.  Evidence of pneumatosis and bladder wall thickening consistent with emphysematous cystitis.  Bilateral hydroureteronephrosis also noted.  Leukocytosis to 25.53 previously was persistently elevated on a month and a half ago. > Started on cefepime  in the ED.  Urology being consulted. - Appreciate urology  recommendations and assistance - Monitoring on progressive unit - Continue with cefepime  - Trend fever curve and WBC - Follow-up urine culture - Fluids at a rate appropriate for correction of electrolytes as below ADDENDUM > EDP spoke with Urology on call, [place].  Per EDP, they stated that the treatment is primarily bladder drainage which he now has; and patient does not need anything further from a urology standpoint.   Hyponatremia Hyperkalemia AKI on CKD 3B Uremia Hydronephrosis > Patient presenting after fall found to have sodium 125, potassium 6.9, creatinine elevated 9.34 from baseline 2.2.  BUN 163 with bicarb 15 and gap of 21.  This is in the setting of CT findings consistent with emphysematous cystitis and mild bilateral hydroureteronephrosis.  Mild rhabdo possibly contributing as well. > In the setting of prostatomegaly and urinary retention diagnosed during admission around 6 weeks ago with Foley catheter placed with plans for follow-up with urology that has not yet occurred.  > Patient received insulin , albuterol , Lokelma , 2 L IV fluids in the ED. > Nephrology and urology consulted. - Appreciate nephrology recommendations and assistance - Long-term progressive - Hold off on further IV fluids pending correction from initial fluids - Trend renal function and electrolytes - If AKI is obstructive as it appears to be will need possible intervention by urology ADDENDUM > EDP spoke with Urology on call, [place].  Per EDP, they stated that the treatment is primarily bladder drainage which he now has; and patient does not need anything further from a urology standpoint.   Fall Rhabdomyolysis > Had a fall yesterday possibly down from 24 hours.  Lactic acid normal.  CK elevated to thousand. - Receiving IV fluids as above - Repeat CK in the morning  Hypertension - Not on antihypertensives  Hyperlipidemia - Continue atorvastatin   Diabetes - SSI  History of CVA - Continue  ASA, atorvastatin   Hypothyroidism - Continue home Synthroid   BPH > Had chronic Foley as above.  Hydronephrosis as above. - Follow-up any recommendations from urology - Continue finasteride  and tamsulosin   DVT prophylaxis: Heparin  Code Status:   DNR/DNI Family Communication:  Updated at bedside  Disposition Plan:   Patient is from:  Home  Anticipated DC to:  Home  Anticipated DC date:  2 to 7 days  Anticipated DC barriers: None  Consults called:  Nephrology, urology Admission status:  Inpatient, progressive  Severity of Illness: The appropriate patient status for this patient is INPATIENT. Inpatient status is judged to be reasonable and necessary in order to provide the required intensity of service to ensure the patient's safety. The patient's presenting symptoms, physical exam findings, and initial radiographic  and laboratory data in the context of their chronic comorbidities is felt to place them at high risk for further clinical deterioration. Furthermore, it is not anticipated that the patient will be medically stable for discharge from the hospital within 2 midnights of admission.   * I certify that at the point of admission it is my clinical judgment that the patient will require inpatient hospital care spanning beyond 2 midnights from the point of admission due to high intensity of service, high risk for further deterioration and high frequency of surveillance required.DEWAINE Marsa KATHEE Seena MD [facility]  How to contact the [facility] Attending or Consulting provider 7A - 7P or covering provider during after hours 7P -7A, for this patient?   Check the care team in [facility] and look for a) attending/consulting [facility] provider listed and b) the [facility] team listed Log into [email or web address] and use [place]'s universal password to access. If you do not have the password, please contact the hospital operator. Locate the [facility] provider you are looking for under [facility] and page to  a number that you can be directly reached. If you still have difficulty reaching the provider, please page the [facility] (Director on Call) for the Hospitalists listed on amion for assistance.  03/13/2024, 5:21 PM

## 2024-03-13 NOTE — Progress Notes (Signed)
 Pharmacy Antibiotic Note  Jorge Butler is a 89 y.o. male for which pharmacy has been consulted for cefepime  dosing for UTI.  Patient with a history of HTN, HLS, DM, CKD, CVA, hypothyroidism, BPH, foley, glaucoma, pituitary adenoma s/p resection. Patient presenting with fall.  SCr 10 - AKI on CKD WBC 25.5; LA 1.9; T 97.7; HR 99; RR 20  CK 1014  Plan: Cefepime  1g q24hr  Monitor WBC, fever, renal function, cultures De-escalate when able F/u Nephrology plan  Height: 5' 10 (177.8 cm) Weight: 75.2 kg (165 lb 12.6 oz) IBW/kg (Calculated) : 73  Temp (24hrs), Avg:97.7 F (36.5 C), Min:97.7 F (36.5 C), Max:97.7 F (36.5 C)  Recent Labs  Lab 03/13/24 1450 03/13/24 1516 03/13/24 1517  WBC 25.5*  --   --   CREATININE 9.34* 10.00*  --   LATICACIDVEN  --   --  1.9    Estimated Creatinine Clearance: 5.1 mL/min (A) (by C-G formula based on SCr of 10 mg/dL (H)).    Allergies  Allergen Reactions   Sulfa Antibiotics Nausea And Vomiting   Microbiology results: Pending  Thank you for allowing pharmacy to be a part of this patient's care.  Dorn Buttner, PharmD, BCPS 03/13/2024 4:42 PM ED Clinical Pharmacist -  [phone]

## 2024-03-14 ENCOUNTER — Encounter (HOSPITAL_COMMUNITY): Payer: Self-pay | Admitting: Internal Medicine

## 2024-03-14 DIAGNOSIS — E119 Type 2 diabetes mellitus without complications: Secondary | ICD-10-CM | POA: Diagnosis not present

## 2024-03-14 DIAGNOSIS — N179 Acute kidney failure, unspecified: Secondary | ICD-10-CM

## 2024-03-14 DIAGNOSIS — N1832 Chronic kidney disease, stage 3b: Secondary | ICD-10-CM

## 2024-03-14 DIAGNOSIS — E039 Hypothyroidism, unspecified: Secondary | ICD-10-CM

## 2024-03-14 DIAGNOSIS — N3080 Other cystitis without hematuria: Secondary | ICD-10-CM

## 2024-03-14 LAB — CBC
HCT: 27.7 % — ABNORMAL LOW (ref 39.0–52.0)
Hemoglobin: 9.3 g/dL — ABNORMAL LOW (ref 13.0–17.0)
MCH: 28.2 pg (ref 26.0–34.0)
MCHC: 33.6 g/dL (ref 30.0–36.0)
MCV: 83.9 fL (ref 80.0–100.0)
Platelets: 292 10*3/uL (ref 150–400)
RBC: 3.3 MIL/uL — ABNORMAL LOW (ref 4.22–5.81)
RDW: 14.5 % (ref 11.5–15.5)
WBC: 11.9 10*3/uL — ABNORMAL HIGH (ref 4.0–10.5)
nRBC: 0 % (ref 0.0–0.2)

## 2024-03-14 LAB — BASIC METABOLIC PANEL WITH GFR
Anion gap: 16 — ABNORMAL HIGH (ref 5–15)
Anion gap: 15 (ref 5–15)
Anion gap: 16 — ABNORMAL HIGH (ref 5–15)
BUN: 135 mg/dL — ABNORMAL HIGH (ref 8–23)
BUN: 126 mg/dL — ABNORMAL HIGH (ref 8–23)
BUN: 118 mg/dL — ABNORMAL HIGH (ref 8–23)
CO2: 18 mmol/L — ABNORMAL LOW (ref 22–32)
CO2: 18 mmol/L — ABNORMAL LOW (ref 22–32)
CO2: 18 mmol/L — ABNORMAL LOW (ref 22–32)
Calcium: 7.7 mg/dL — ABNORMAL LOW (ref 8.9–10.3)
Calcium: 7.8 mg/dL — ABNORMAL LOW (ref 8.9–10.3)
Calcium: 7.9 mg/dL — ABNORMAL LOW (ref 8.9–10.3)
Chloride: 97 mmol/L — ABNORMAL LOW (ref 98–111)
Chloride: 100 mmol/L (ref 98–111)
Chloride: 100 mmol/L (ref 98–111)
Creatinine, Ser: 6.72 mg/dL — ABNORMAL HIGH (ref 0.61–1.24)
Creatinine, Ser: 6 mg/dL — ABNORMAL HIGH (ref 0.61–1.24)
Creatinine, Ser: 5.49 mg/dL — ABNORMAL HIGH (ref 0.61–1.24)
GFR, Estimated: 7 mL/min — ABNORMAL LOW (ref >60)
GFR, Estimated: 8 mL/min — ABNORMAL LOW (ref >60)
GFR, Estimated: 9 mL/min — ABNORMAL LOW (ref >60)
Glucose, Bld: 148 mg/dL — ABNORMAL HIGH (ref 70–99)
Glucose, Bld: 110 mg/dL — ABNORMAL HIGH (ref 70–99)
Glucose, Bld: 97 mg/dL (ref 70–99)
Potassium: 4.9 mmol/L (ref 3.5–5.1)
Potassium: 4.4 mmol/L (ref 3.5–5.1)
Potassium: 4.3 mmol/L (ref 3.5–5.1)
Sodium: 131 mmol/L — ABNORMAL LOW (ref 135–145)
Sodium: 133 mmol/L — ABNORMAL LOW (ref 135–145)
Sodium: 134 mmol/L — ABNORMAL LOW (ref 135–145)

## 2024-03-14 LAB — GLUCOSE, CAPILLARY
Glucose-Capillary: 152 mg/dL — ABNORMAL HIGH (ref 70–99)
Glucose-Capillary: 220 mg/dL — ABNORMAL HIGH (ref 70–99)
Glucose-Capillary: 249 mg/dL — ABNORMAL HIGH (ref 70–99)
Glucose-Capillary: 249 mg/dL — ABNORMAL HIGH (ref 70–99)
Glucose-Capillary: 280 mg/dL — ABNORMAL HIGH (ref 70–99)
Glucose-Capillary: 85 mg/dL (ref 70–99)

## 2024-03-14 LAB — CK: Total CK: 479 U/L — ABNORMAL HIGH (ref 49–397)

## 2024-03-14 LAB — BRAIN NATRIURETIC PEPTIDE: B Natriuretic Peptide: 84.6 pg/mL (ref 0.0–100.0)

## 2024-03-14 MED ORDER — CHLORHEXIDINE GLUCONATE CLOTH 2 % EX PADS
6.0000 | MEDICATED_PAD | Freq: Every day | CUTANEOUS | Status: DC
  Administered 2024-03-15 – 2024-03-18 (×4): 6 via TOPICAL

## 2024-03-14 NOTE — Evaluation (Signed)
 Physical Therapy Evaluation Patient Details Name: Jorge Butler MRN: [id number] DOB: 06-25-1932 Today's Date: 03/14/2024  History of Present Illness  The pt is a 89 yo male presenting 6/21 after being found down at home (likely around 24 hours). Work up revealed BG in 400s, UTI, and emphysematous cystitis. PMH includes: HTN, HLD, DM II, CKD III, CVA, hypothyroidism, BPH, glaucoma, and  indwelling catheter.   Clinical Impression  Pt in bed upon arrival of PT, agreeable to evaluation at this time. Prior to admission the pt was ambulating at home without use of DME, pt reports living alone and independent with all ADLs, IADLs, and was still driving and managing medications. The pt required minA to complete bed mobility and minA of 2 or modA of 1 to complete sit-stand transfer from EOB with RW. Pt with poor strength, power, and activity tolerance, is dependent on assist for any OOB mobility and limited to a few small lateral steps at this time. Unable to safely return home without significant increase in assistance from family, pt will benefit from continued skilled PT acutely and post-acute inpatient rehab <3hours/day to facilitate return to greatest level of independence.   VITALS:  - supine in bed - BP: 102/40 (58); - sitting EOB - BP: 107/42 (62);  - sitting EOB post-stand - BP: 94/53 (67); - supine in bed at end of session - BP: 113/45 (66); HR: 84bpm      If plan is discharge home, recommend the following: A lot of help with walking and/or transfers;A lot of help with bathing/dressing/bathroom;Assistance with cooking/housework;Direct supervision/assist for medications management;Direct supervision/assist for financial management;Assist for transportation;Help with stairs or ramp for entrance;Supervision due to cognitive status   Can travel by private vehicle   No    Equipment Recommendations Rolling walker (2 wheels);Wheelchair (measurements PT);Wheelchair cushion (measurements PT)   Recommendations for Other Services       Functional Status Assessment Patient has had a recent decline in their functional status and demonstrates the ability to make significant improvements in function in a reasonable and predictable amount of time.     Precautions / Restrictions Precautions Precautions: Fall Recall of Precautions/Restrictions: Impaired Restrictions Weight Bearing Restrictions Per Provider Order: No      Mobility  Bed Mobility Overal bed mobility: Needs Assistance Bed Mobility: Supine to Sit, Sit to Supine     Supine to sit: Min assist, HOB elevated Sit to supine: Min assist, +2 for physical assistance   General bed mobility comments: minA to transition to EOB, pt able to manage LE but needing assist to trunk and to scoot to EOB. minA of 2 to reposition in bed    Transfers Overall transfer level: Needs assistance Equipment used: Rolling walker (2 wheels) Transfers: Sit to/from Stand Sit to Stand: Mod assist           General transfer comment: minA of 2 or modA of 1, slow to rise, limited power and endurance in BLE. no buckling but poor standing tolerance    Ambulation/Gait Ambulation/Gait assistance: Mod assist Gait Distance (Feet): 2 Feet Assistive device: Rolling walker (2 wheels) Gait Pattern/deviations: Step-to pattern, Decreased stride length Gait velocity: decreased Gait velocity interpretation: <1.31 ft/sec, indicative of household ambulator   General Gait Details: minimal step clearance, assist to balance and advance RW, pt unable to manage more than 1-2 steps before needing to sit    Balance Overall balance assessment: Needs assistance Sitting-balance support: No upper extremity supported Sitting balance-Leahy Scale: Fair     Standing  balance support: Bilateral upper extremity supported, During functional activity Standing balance-Leahy Scale: Poor Standing balance comment: modA and BUE support                              Pertinent Vitals/Pain Pain Assessment Pain Assessment: No/denies pain    Home Living Family/patient expects to be discharged to:: Private residence Living Arrangements: Alone Available Help at Discharge: Family;Available PRN/intermittently Type of Home: Apartment Home Access: Stairs to enter Entrance Stairs-Rails: Doctor, general practice of Steps: 3-4   Home Layout: One level Home Equipment: Cane - single Librarian, academic (2 wheels)      Prior Function Prior Level of Function : Independent/Modified Independent;Driving             Mobility Comments: pt reports independent, no other falls. does all cooking, medicine management, but is largely sedentary spending his day in recliner ADLs Comments: pt reports largely independent     Extremity/Trunk Assessment   Upper Extremity Assessment Upper Extremity Assessment: Defer to OT evaluation    Lower Extremity Assessment Lower Extremity Assessment: Generalized weakness    Cervical / Trunk Assessment Cervical / Trunk Assessment: Kyphotic  Communication   Communication Communication: No apparent difficulties    Cognition Arousal: Alert Behavior During Therapy: WFL for tasks assessed/performed   PT - Cognitive impairments: No apparent impairments                         Following commands: Intact       Cueing Cueing Techniques: Verbal cues     General Comments General comments (skin integrity, edema, etc.): significant drainage from penis around catheter noted, RN called in to assess and messaged MD. pt cleaned, linens changed. BP also soft throughout, MAP to 58 at low, pt reports asymptomatic    Exercises     Assessment/Plan    PT Assessment Patient needs continued PT services  PT Problem List Decreased strength;Decreased activity tolerance;Decreased balance;Decreased mobility       PT Treatment Interventions DME instruction;Gait training;Functional mobility  training;Therapeutic activities;Therapeutic exercise;Balance training;Patient/family education    PT Goals (Current goals can be found in the Care Plan section)  Acute Rehab PT Goals Patient Stated Goal: to return home PT Goal Formulation: With patient Time For Goal Achievement: 03/28/24 Potential to Achieve Goals: Good    Frequency Min 2X/week        AM-PAC PT 6 Clicks Mobility  Outcome Measure Help needed turning from your back to your side while in a flat bed without using bedrails?: A Little Help needed moving from lying on your back to sitting on the side of a flat bed without using bedrails?: A Little Help needed moving to and from a bed to a chair (including a wheelchair)?: A Lot Help needed standing up from a chair using your arms (e.g., wheelchair or bedside chair)?: A Lot Help needed to walk in hospital room?: Total Help needed climbing 3-5 steps with a railing? : Total 6 Click Score: 12    End of Session Equipment Utilized During Treatment: Gait belt Activity Tolerance: Patient tolerated treatment well Patient left: in bed;with call bell/phone within reach;with bed alarm set Nurse Communication: Mobility status PT Visit Diagnosis: Unsteadiness on feet (R26.81);Muscle weakness (generalized) (M62.81)    Time: 8448-8379 PT Time Calculation (min) (ACUTE ONLY): 29 min   Charges:   PT Evaluation $PT Eval Moderate Complexity: 1 Mod PT Treatments $Therapeutic Activity: 8-22 mins PT  General Charges $$ ACUTE PT VISIT: 1 Visit         Izetta Call, PT, DPT   Acute Rehabilitation Department Office [phone] Secure Chat Communication Preferred  Izetta JULIANNA Call 03/14/2024, 5:59 PM

## 2024-03-14 NOTE — Plan of Care (Signed)

## 2024-03-14 NOTE — Progress Notes (Addendum)
 PROGRESS NOTE        PATIENT DETAILS Name: Jorge Butler Age: 89 y.o. Sex: male Date of Birth: 05-Mar-1932 Admit Date: 03/13/2024 Admitting Physician Marsa KATHEE Scurry, MD ERE:Mlddn, Norleen, MD  Brief Summary: Patient is a 89 y.o.  male with history of CKD stage IIIb, urinary retention requiring indwelling Foley catheter, HTN, DM-2, pituitary adenoma-s/p resection in 1991-brought to the ED by family-as he was found on the floor following a fall-his Foley bag was noted to be empty-upon further evaluation-patient was found to have AKI, emphysematous cystitis.  Significant events: 6/21>> admit to [facility]  Significant studies: 6/21>> CT head: No acute findings 6/21>> CT C-spine: No fracture 6/21>> CT chest/abdomen/pelvis: No traumatic injury-findings consistent with emphysematous cystitis, mild bilateral hydronephrosis.  Significant prostatomegaly.  Significant microbiology data: 6/21>> urine culture: E. coli  Procedures: None  Consults: Nephrology  Subjective: Lying comfortably in bed-denies any chest pain or shortness of breath.  Objective: Vitals: Blood pressure (!) 103/53, pulse 72, temperature 97.8 F (36.6 C), temperature source Oral, resp. rate 20, height 5' 10 (1.778 m), weight 75.2 kg, SpO2 96%.   Exam: Gen Exam:Alert awake-not in any distress HEENT:atraumatic, normocephalic Chest: B/L clear to auscultation anteriorly CVS:S1S2 regular Abdomen:soft non tender, non distended Extremities:no edema Neurology: Non focal Skin: no rash  Pertinent Labs/Radiology:    Latest Ref Rng & Units 03/14/2024    6:18 AM 03/13/2024    3:16 PM 03/13/2024    2:50 PM  CBC  WBC 4.0 - 10.5 K/uL 11.9   25.5   Hemoglobin 13.0 - 17.0 g/dL 9.3  88.0  88.9   Hematocrit 39.0 - 52.0 % 27.7  35.0  33.2   Platelets 150 - 400 K/uL 292   332     Lab Results  Component Value Date   NA 134 (L) 03/14/2024   K 4.3 03/14/2024   CL 100 03/14/2024   CO2 18 (L)  03/14/2024      Assessment/Plan: AKI on CKD stage IIIb AKI felt to be multifactorial-secondary to bladder outlet obstruction-rhabdomyolysis- emphysematous cystitis AKI improving after treatment of underlying etiologies Continue to avoid nephrotoxic agents Follow renal function Nephrology following.  Hyperkalemia Secondary to AKI Resolved  Emphysematous cystitis Continue IV cefepime -await final urine cultures Per prior documentation-Foley bag was empty on initial presentation-likely had bladder outlet obstruction-Foley replaced in the ED-with good urine output overnight Admitting MD had discussed with urology-apart from bladder drainage with Foley-IV antibiotics no further recommendations.  Mild bilateral hydronephrosis Likely secondary to bladder outlet obstruction-emphysematous cystitis Foley catheter now draining well.  Rhabdomyolysis Secondary to be on the floor for 24 hours following a fall Rhabdomyolysis was mild-already improving with IV fluids.  Normocytic anemia Likely due to combination of CKD/acute illness Follow CBC periodically.  Mechanical fall Multiple imaging studies negative for significant/traumatic injury PT/OT eval ordered.  HTN BP stable-currently not on any antihypertensives  HLD Statin  DM-2 (A1c 7.0 on 5/7) CBG stable on SSI Oral hypoglycemics remain on hold  Recent Labs    03/14/24 0353 03/14/24 0753 03/14/24 1118  GLUCAP 152* 85 220*     History of CVA Aspirin /statin.  History of urinary retention-chronic indwelling Foley catheter Foley catheter replaced in the emergency room Remains on finasteride /Flomax    Code status:   Code Status: Limited: Do not attempt resuscitation (DNR) -DNR-LIMITED -Do Not Intubate/DNI    DVT Prophylaxis: heparin   injection 5,000 Units Start: 03/13/24 2200   Family Communication: None at bedside   Disposition Plan: Status is: Inpatient Remains inpatient appropriate because: Severity of  illness   Planned Discharge Destination:Home health versus SNF   Diet: Diet Order             Diet heart healthy/carb modified Room service appropriate? Yes; Fluid consistency: Thin  Diet effective now                     Antimicrobial agents: Anti-infectives (From admission, onward)    Start     Dose/Rate Route Frequency Ordered Stop   03/14/24 1700  ceFEPIme  (MAXIPIME ) 1 g in sodium chloride  0.9 % 100 mL IVPB        1 g 200 mL/hr over 30 Minutes Intravenous Every 24 hours 03/13/24 1843     03/13/24 1545  ceFEPIme  (MAXIPIME ) 1 g in sodium chloride  0.9 % 100 mL IVPB        1 g 200 mL/hr over 30 Minutes Intravenous  Once 03/13/24 1533 03/13/24 1806        MEDICATIONS: Scheduled Meds:  aspirin  EC  81 mg Oral Daily   atorvastatin   10 mg Oral Daily   finasteride   5 mg Oral Daily   heparin   5,000 Units Subcutaneous Q8H   insulin  aspart  0-9 Units Subcutaneous Q4H   levothyroxine   88 mcg Oral Q0600   sodium chloride  flush  3 mL Intravenous Q12H   tamsulosin   0.4 mg Oral Daily   Continuous Infusions:  ceFEPime  (MAXIPIME ) IV     PRN Meds:.acetaminophen  **OR** acetaminophen    I have personally reviewed following labs and imaging studies  LABORATORY DATA: CBC: Recent Labs  Lab 03/13/24 1450 03/13/24 1516 03/14/24 0618  WBC 25.5*  --  11.9*  HGB 11.0* 11.9* 9.3*  HCT 33.2* 35.0* 27.7*  MCV 86.2  --  83.9  PLT 332  --  292    Basic Metabolic Panel: Recent Labs  Lab 03/13/24 1450 03/13/24 1516 03/13/24 1850 03/13/24 2122 03/14/24 0125 03/14/24 0618 03/14/24 0942  NA 125*   < > 132* 133* 131* 133* 134*  K 6.9*   < > 5.3* 5.7* 4.9 4.4 4.3  CL 89*   < > 96* 100 97* 100 100  CO2 15*  --  16* 18* 18* 18* 18*  GLUCOSE 352*   < > 180* 102* 148* 110* 97  BUN 163*   < > 147* 145* 135* 126* 118*  CREATININE 9.34*   < > 8.00* 7.52* 6.72* 6.00* 5.49*  CALCIUM  8.2*  --  8.0* 7.9* 7.7* 7.8* 7.9*  MG 2.3  --   --   --   --   --   --    < > = values in this  interval not displayed.    GFR: Estimated Creatinine Clearance: 9.2 mL/min (A) (by C-G formula based on SCr of 5.49 mg/dL (H)).  Liver Function Tests: Recent Labs  Lab 03/13/24 1450  AST 32  ALT 21  ALKPHOS 102  BILITOT 0.9  PROT 7.0  ALBUMIN 2.4*   No results for input(s): LIPASE, AMYLASE in the last 168 hours. No results for input(s): AMMONIA in the last 168 hours.  Coagulation Profile: Recent Labs  Lab 03/13/24 1450  INR 1.1    Cardiac Enzymes: Recent Labs  Lab 03/13/24 1450 03/14/24 0618  CKTOTAL 1,014* 479*    BNP (last 3 results) No results for input(s): PROBNP in the last 8760  hours.  Lipid Profile: No results for input(s): CHOL, HDL, LDLCALC, TRIG, CHOLHDL, LDLDIRECT in the last 72 hours.  Thyroid  Function Tests: No results for input(s): TSH, T4TOTAL, FREET4, T3FREE, THYROIDAB in the last 72 hours.  Anemia Panel: No results for input(s): VITAMINB12, FOLATE, FERRITIN, TIBC, IRON, RETICCTPCT in the last 72 hours.  Urine analysis:    Component Value Date/Time   COLORURINE YELLOW 03/13/2024 1450   APPEARANCEUR TURBID (A) 03/13/2024 1450   LABSPEC 1.010 03/13/2024 1450   PHURINE 6.0 03/13/2024 1450   GLUCOSEU 50 (A) 03/13/2024 1450   HGBUR LARGE (A) 03/13/2024 1450   BILIRUBINUR NEGATIVE 03/13/2024 1450   BILIRUBINUR negative 04/16/2023 1130   KETONESUR NEGATIVE 03/13/2024 1450   PROTEINUR 100 (A) 03/13/2024 1450   UROBILINOGEN 0.2 04/16/2023 1130   NITRITE NEGATIVE 03/13/2024 1450   LEUKOCYTESUR MODERATE (A) 03/13/2024 1450    Sepsis Labs: Lactic Acid, Venous    Component Value Date/Time   LATICACIDVEN 1.9 03/13/2024 1517    MICROBIOLOGY: Recent Results (from the past 240 hours)  Urine Culture     Status: Abnormal (Preliminary result)   Collection Time: 03/13/24  2:50 PM   Specimen: Urine, Random  Result Value Ref Range Status   Specimen Description URINE, RANDOM  Final   Special Requests NONE  Reflexed from [id number]  Final   Culture (A)  Final    >=100,000 COLONIES/mL ESCHERICHIA COLI SUSCEPTIBILITIES TO FOLLOW Performed at [facility], [address], [place], [place] [address]    Report Status PENDING  Incomplete    RADIOLOGY STUDIES/RESULTS: CT CHEST ABDOMEN PELVIS WO CONTRAST Result Date: 03/13/2024 CLINICAL DATA:  Trauma EXAM: CT CHEST, ABDOMEN AND PELVIS WITHOUT CONTRAST TECHNIQUE: Multidetector CT imaging of the chest, abdomen and pelvis was performed following the standard protocol without IV contrast. RADIATION DOSE REDUCTION: This exam was performed according to the departmental dose-optimization program which includes automated exposure control, adjustment of the mA and/or kV according to patient size and/or use of iterative reconstruction technique. COMPARISON:  CT abdomen and pelvis 01/27/2024 FINDINGS: CT CHEST FINDINGS Cardiovascular: No significant vascular findings. Normal heart size. No pericardial effusion. Mediastinum/Nodes: No enlarged mediastinal, hilar, or axillary lymph nodes. Thyroid  gland, trachea, and esophagus demonstrate no significant findings. Lungs/Pleura: There is linear atelectasis in the bilateral lower lobes. The lungs are otherwise clear. There is no pleural effusion or pneumothorax. Musculoskeletal: No acute fractures are seen. CT ABDOMEN PELVIS FINDINGS Hepatobiliary: Gallstones are present. Liver is within normal limits. There is no biliary ductal dilatation. Pancreas: Unremarkable. No pancreatic ductal dilatation or surrounding inflammatory changes. Spleen: Normal in size without focal abnormality. Adrenals/Urinary Tract: There is mild bilateral hydroureteronephrosis to the level of the bladder. No urinary tract calculi are seen. The kidneys are otherwise within normal limits. The adrenal glands are within normal limits. There is diffuse bladder wall thickening. The bladder is decompressed by Foley catheter. There are findings suspicious for  pneumatosis of the bladder wall there is inflammatory stranding surrounding the bladder. Stomach/Bowel: Stomach is within normal limits. No evidence of bowel wall thickening, distention, or inflammatory changes. There are scattered colonic diverticula. Duodenal diverticulum is present. The appendix is not visualized. Vascular/Lymphatic: Aortic atherosclerosis. No enlarged abdominal or pelvic lymph nodes. Reproductive: Prostate gland is significantly enlarged. Other: There is presacral edema. There is no ascites or free air. There is no focal abdominal wall hernia or hematoma. Musculoskeletal: No acute fractures are seen. IMPRESSION: 1. No acute posttraumatic sequelae in the chest, abdomen or pelvis. 2. Diffuse bladder wall  thickening with surrounding inflammatory stranding and findings suspicious for pneumatosis of the bladder wall. 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 3. Mild bilateral hydroureteronephrosis to the level of the bladder likely related to bladder process above. No urinary tract calculi are seen. Follow-up recommended. 4. Significant prostatomegaly. 5. Cholelithiasis. 6. Colonic diverticulosis. Aortic Atherosclerosis (ICD10-I70.0) and Emphysema (ICD10-J43.9). Electronically Signed   By: Greig Pique M.D.   On: 03/13/2024 15:55   CT HEAD WO CONTRAST Result Date: 03/13/2024 CLINICAL DATA:  Head trauma, moderate-severe; Polytrauma, blunt EXAM: CT HEAD WITHOUT CONTRAST CT CERVICAL SPINE WITHOUT CONTRAST TECHNIQUE: Multidetector CT imaging of the head and cervical spine was performed following the standard protocol without intravenous contrast. Multiplanar CT image reconstructions of the cervical spine were also generated. RADIATION DOSE REDUCTION: This exam was performed according to the departmental dose-optimization program which includes automated exposure control, adjustment of the mA and/or kV according to patient size and/or use of iterative reconstruction technique. COMPARISON:   None Available. FINDINGS: CT HEAD FINDINGS Brain: No evidence of acute infarction, hemorrhage, hydrocephalus, extra-axial collection or mass lesion/mass effect. Vascular: Calcific atherosclerosis.  No hyperdense vessel. Skull: No acute fracture. Sinuses/Orbits: Opacified left sphenoid sinus. No acute orbital findings. Other: No mastoid effusions. CT CERVICAL SPINE FINDINGS Alignment: Mild anterolisthesis of C4 on C5, most likely degenerative. Skull base and vertebrae: No evidence of acute fracture. Soft tissues and spinal canal: No prevertebral fluid or swelling. No visible canal hematoma. Disc levels: Mild-to-moderate multilevel degenerative change. Bony fusion at C2-C3 posteriorly. Upper chest: Visualized lung apices are clear. IMPRESSION: 1. No evidence of acute intracranial abnormality. 2. No evidence of acute fracture or traumatic malalignment in the cervical spine. 3. Opacified left sphenoid sinus. Electronically Signed   By: Gilmore GORMAN Molt M.D.   On: 03/13/2024 15:53   CT CERVICAL SPINE WO CONTRAST Result Date: 03/13/2024 CLINICAL DATA:  Head trauma, moderate-severe; Polytrauma, blunt EXAM: CT HEAD WITHOUT CONTRAST CT CERVICAL SPINE WITHOUT CONTRAST TECHNIQUE: Multidetector CT imaging of the head and cervical spine was performed following the standard protocol without intravenous contrast. Multiplanar CT image reconstructions of the cervical spine were also generated. RADIATION DOSE REDUCTION: This exam was performed according to the departmental dose-optimization program which includes automated exposure control, adjustment of the mA and/or kV according to patient size and/or use of iterative reconstruction technique. COMPARISON:  None Available. FINDINGS: CT HEAD FINDINGS Brain: No evidence of acute infarction, hemorrhage, hydrocephalus, extra-axial collection or mass lesion/mass effect. Vascular: Calcific atherosclerosis.  No hyperdense vessel. Skull: No acute fracture. Sinuses/Orbits: Opacified left  sphenoid sinus. No acute orbital findings. Other: No mastoid effusions. CT CERVICAL SPINE FINDINGS Alignment: Mild anterolisthesis of C4 on C5, most likely degenerative. Skull base and vertebrae: No evidence of acute fracture. Soft tissues and spinal canal: No prevertebral fluid or swelling. No visible canal hematoma. Disc levels: Mild-to-moderate multilevel degenerative change. Bony fusion at C2-C3 posteriorly. Upper chest: Visualized lung apices are clear. IMPRESSION: 1. No evidence of acute intracranial abnormality. 2. No evidence of acute fracture or traumatic malalignment in the cervical spine. 3. Opacified left sphenoid sinus. Electronically Signed   By: Gilmore GORMAN Molt M.D.   On: 03/13/2024 15:53   DG Pelvis Portable Result Date: 03/13/2024 CLINICAL DATA:  Status post fall with right hip pain. EXAM: PORTABLE PELVIS 1 VIEWS COMPARISON:  None Available. FINDINGS: There is no evidence of acute displaced pelvic fracture or diastasis. No pelvic bone lesions are seen. Mild degenerative changes of bilateral hips. Vascular calcifications. IMPRESSION: 1. No acute displaced pelvic fracture or  diastasis. 2. Mild degenerative changes of bilateral hips. Electronically Signed   By: Limin  Xu M.D.   On: 03/13/2024 15:16   DG Chest Port 1 View Result Date: 03/13/2024 CLINICAL DATA:  Status post fall EXAM: PORTABLE CHEST 1 VIEW COMPARISON:  Chest radiograph dated 01/27/2024 FINDINGS: Normal lung volumes. No focal consolidations. No pleural effusion or pneumothorax. The heart size and mediastinal contours are within normal limits. No acute osseous abnormality. IMPRESSION: No acute disease. Electronically Signed   By: Limin  Xu M.D.   On: 03/13/2024 15:14     LOS: 1 day   Donalda Applebaum, MD  [facility]    To contact the attending provider between 7A-7P or the covering provider during after hours 7P-7A, please log into the web site [email or web address] and access using universal [place] password for that  web site. If you do not have the password, please call the hospital operator.  03/14/2024, 1:20 PM

## 2024-03-14 NOTE — Progress Notes (Signed)
 Subjective:  seems a little brighter today - 3500 of UOP-   urine clearing up-  BUN /crt trending down slowly  Objective Vital signs in last 24 hours: Vitals:   03/13/24 1930 03/13/24 2312 03/14/24 0355 03/14/24 0800  BP: (!) 101/51 (!) 106/44 (!) 108/45 (!) 103/53  Pulse: 79 73 72 72  Resp: (!) 23 (!) 23 17 20   Temp: 97.8 F (36.6 C) 98.2 F (36.8 C) 98 F (36.7 C) 97.8 F (36.6 C)  TempSrc: Oral Oral Oral Oral  SpO2: 97% 96% 96% 96%  Weight:      Height:       Weight change:   Intake/Output Summary (Last 24 hours) at 03/14/2024 1224 Last data filed at 03/14/2024 0640 Gross per 24 hour  Intake --  Output 3550 ml  Net -3550 ml     Assessment/Plan: 89 year old WM with HTN, DM and CKD.  Now presents with A on CRF in the setting of fall with prolonged immobility and acute bladder outlet obstruction 1.Renal- has fairly advanced CKD at baseline ( crt low 2's). Now with AKI and element of rhabdo, UTI and BOO.  Foley has been replaced and he is nonoliguric, antibiotics as well as high dose IVF being given.   I hope we will be able to achieve improvement in renal function for this nice man.  If this does not improve willl be difficult as I would not consider a 89 year old with new failure to thrive to be a good dialysis candidate.  Fortunately is improving slowly-  IVF have stopped with better CK 2. Hypertension/volume  - not overloaded-  previous IVF- is better, IVF stopped 3. Hyperkalemia-  quite severe initially but has resolved-  lokelma  stopped  4. Anemia  - not a significant issue at this time-  will likely go down with hydration 5. 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 No results for input(s): AMMONIA in the last 168 hours. CBC: Recent Labs  Lab 03/13/24 1450 03/13/24 1516 03/14/24 0618  WBC 25.5*  --  11.9*  HGB 11.0* 11.9* 9.3*  HCT 33.2* 35.0* 27.7*  MCV 86.2  --  83.9  PLT 332  --  292   Cardiac Enzymes: Recent Labs  Lab 03/13/24 1450 03/14/24 0618  CKTOTAL 1,014* 479*   CBG: Recent Labs  Lab 03/13/24 2015 03/13/24 2311 03/14/24 0353 03/14/24 0753 03/14/24 1118  GLUCAP 158* 92 152* 85 220*    Iron Studies: No results for input(s): IRON, TIBC, TRANSFERRIN, FERRITIN in the last 72 hours. Studies/Results: CT CHEST ABDOMEN PELVIS WO CONTRAST Result Date: 03/13/2024 CLINICAL DATA:  Trauma EXAM: CT CHEST, ABDOMEN AND PELVIS WITHOUT CONTRAST TECHNIQUE: Multidetector CT imaging of the chest, abdomen and pelvis was performed following the standard protocol without  IV contrast. RADIATION DOSE REDUCTION: This exam was performed according to the departmental dose-optimization program which includes automated exposure control, adjustment of the mA and/or kV according to patient size and/or use of iterative reconstruction technique. COMPARISON:  CT abdomen and pelvis 01/27/2024 FINDINGS: CT CHEST FINDINGS Cardiovascular: No significant vascular findings. Normal heart size. No pericardial effusion. Mediastinum/Nodes: No enlarged mediastinal, hilar, or axillary lymph nodes. Thyroid  gland, trachea, and esophagus demonstrate no significant findings. Lungs/Pleura: There is linear atelectasis in the bilateral lower lobes. The lungs are otherwise clear. There is no pleural effusion or pneumothorax. Musculoskeletal: No acute fractures are seen. CT ABDOMEN PELVIS FINDINGS  Hepatobiliary: Gallstones are present. Liver is within normal limits. There is no biliary ductal dilatation. Pancreas: Unremarkable. No pancreatic ductal dilatation or surrounding inflammatory changes. Spleen: Normal in size without focal abnormality. Adrenals/Urinary Tract: There is mild bilateral hydroureteronephrosis to the level of the bladder. No urinary tract calculi are seen. The kidneys are otherwise within normal limits. The adrenal glands are within normal limits. There is diffuse bladder wall thickening. The bladder is decompressed by Foley catheter. There are findings suspicious for pneumatosis of the bladder wall there is inflammatory stranding surrounding the bladder. Stomach/Bowel: Stomach is within normal limits. No evidence of bowel wall thickening, distention, or inflammatory changes. There are scattered colonic diverticula. Duodenal diverticulum is present. The appendix is not visualized. Vascular/Lymphatic: Aortic atherosclerosis. No enlarged abdominal or pelvic lymph nodes. Reproductive: Prostate gland is significantly enlarged. Other: There is presacral edema. There is no ascites or free air. There is no focal abdominal wall hernia or hematoma. Musculoskeletal: No acute fractures are seen. IMPRESSION: 1. No acute posttraumatic sequelae in the chest, abdomen or pelvis. 2. Diffuse bladder wall thickening with surrounding inflammatory stranding and findings suspicious for pneumatosis of the bladder wall. 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 3. Mild bilateral hydroureteronephrosis to the level of the bladder likely related to bladder process above. No urinary tract calculi are seen. Follow-up recommended. 4. Significant prostatomegaly. 5. Cholelithiasis. 6. Colonic diverticulosis. Aortic Atherosclerosis (ICD10-I70.0) and Emphysema (ICD10-J43.9). Electronically Signed   By: Greig Pique M.D.   On: 03/13/2024 15:55   CT HEAD WO CONTRAST Result Date: 03/13/2024 CLINICAL DATA:  Head  trauma, moderate-severe; Polytrauma, blunt EXAM: CT HEAD WITHOUT CONTRAST CT CERVICAL SPINE WITHOUT CONTRAST TECHNIQUE: Multidetector CT imaging of the head and cervical spine was performed following the standard protocol without intravenous contrast. Multiplanar CT image reconstructions of the cervical spine were also generated. RADIATION DOSE REDUCTION: This exam was performed according to the departmental dose-optimization program which includes automated exposure control, adjustment of the mA and/or kV according to patient size and/or use of iterative reconstruction technique. COMPARISON:  None Available. FINDINGS: CT HEAD FINDINGS Brain: No evidence of acute infarction, hemorrhage, hydrocephalus, extra-axial collection or mass lesion/mass effect. Vascular: Calcific atherosclerosis.  No hyperdense vessel. Skull: No acute fracture. Sinuses/Orbits: Opacified left sphenoid sinus. No acute orbital findings. Other: No mastoid effusions. CT CERVICAL SPINE FINDINGS Alignment: Mild anterolisthesis of C4 on C5, most likely degenerative. Skull base and vertebrae: No evidence of acute fracture. Soft tissues and spinal canal: No prevertebral fluid or swelling. No visible canal hematoma. Disc levels: Mild-to-moderate multilevel degenerative change. Bony fusion at C2-C3 posteriorly. Upper chest: Visualized lung apices are clear. IMPRESSION: 1. No evidence of acute intracranial abnormality. 2. No evidence of acute fracture or traumatic malalignment in the cervical spine. 3. Opacified left sphenoid sinus. Electronically Signed   By: Gilmore GORMAN Molt M.D.   On: 03/13/2024 15:53  CT CERVICAL SPINE WO CONTRAST Result Date: 03/13/2024 CLINICAL DATA:  Head trauma, moderate-severe; Polytrauma, blunt EXAM: CT HEAD WITHOUT CONTRAST CT CERVICAL SPINE WITHOUT CONTRAST TECHNIQUE: Multidetector CT imaging of the head and cervical spine was performed following the standard protocol without intravenous contrast. Multiplanar CT image  reconstructions of the cervical spine were also generated. RADIATION DOSE REDUCTION: This exam was performed according to the departmental dose-optimization program which includes automated exposure control, adjustment of the mA and/or kV according to patient size and/or use of iterative reconstruction technique. COMPARISON:  None Available. FINDINGS: CT HEAD FINDINGS Brain: No evidence of acute infarction, hemorrhage, hydrocephalus, extra-axial collection or mass lesion/mass effect. Vascular: Calcific atherosclerosis.  No hyperdense vessel. Skull: No acute fracture. Sinuses/Orbits: Opacified left sphenoid sinus. No acute orbital findings. Other: No mastoid effusions. CT CERVICAL SPINE FINDINGS Alignment: Mild anterolisthesis of C4 on C5, most likely degenerative. Skull base and vertebrae: No evidence of acute fracture. Soft tissues and spinal canal: No prevertebral fluid or swelling. No visible canal hematoma. Disc levels: Mild-to-moderate multilevel degenerative change. Bony fusion at C2-C3 posteriorly. Upper chest: Visualized lung apices are clear. IMPRESSION: 1. No evidence of acute intracranial abnormality. 2. No evidence of acute fracture or traumatic malalignment in the cervical spine. 3. Opacified left sphenoid sinus. Electronically Signed   By: Gilmore GORMAN Molt M.D.   On: 03/13/2024 15:53   DG Pelvis Portable Result Date: 03/13/2024 CLINICAL DATA:  Status post fall with right hip pain. EXAM: PORTABLE PELVIS 1 VIEWS COMPARISON:  None Available. FINDINGS: There is no evidence of acute displaced pelvic fracture or diastasis. No pelvic bone lesions are seen. Mild degenerative changes of bilateral hips. Vascular calcifications. IMPRESSION: 1. No acute displaced pelvic fracture or diastasis. 2. Mild degenerative changes of bilateral hips. Electronically Signed   By: Limin  Xu M.D.   On: 03/13/2024 15:16   DG Chest Port 1 View Result Date: 03/13/2024 CLINICAL DATA:  Status post fall EXAM: PORTABLE CHEST 1  VIEW COMPARISON:  Chest radiograph dated 01/27/2024 FINDINGS: Normal lung volumes. No focal consolidations. No pleural effusion or pneumothorax. The heart size and mediastinal contours are within normal limits. No acute osseous abnormality. IMPRESSION: No acute disease. Electronically Signed   By: Limin  Xu M.D.   On: 03/13/2024 15:14   Medications: Infusions:  ceFEPime  (MAXIPIME ) IV      Scheduled Medications:  aspirin  EC  81 mg Oral Daily   atorvastatin   10 mg Oral Daily   finasteride   5 mg Oral Daily   heparin   5,000 Units Subcutaneous Q8H   insulin  aspart  0-9 Units Subcutaneous Q4H   levothyroxine   88 mcg Oral Q0600   sodium chloride  flush  3 mL Intravenous Q12H   tamsulosin   0.4 mg Oral Daily    have reviewed scheduled and prn medications.  Physical Exam: General: elderly , pale, less confused than yesterday-  conversational  Heart: RRR Lungs: mostly clear Abdomen: soft, non tender Extremities: min edema     03/14/2024,12:24 PM  LOS: 1 day

## 2024-03-15 ENCOUNTER — Encounter (HOSPITAL_COMMUNITY): Payer: Self-pay | Admitting: Internal Medicine

## 2024-03-15 DIAGNOSIS — N179 Acute kidney failure, unspecified: Secondary | ICD-10-CM | POA: Diagnosis not present

## 2024-03-15 DIAGNOSIS — E039 Hypothyroidism, unspecified: Secondary | ICD-10-CM | POA: Diagnosis not present

## 2024-03-15 DIAGNOSIS — E119 Type 2 diabetes mellitus without complications: Secondary | ICD-10-CM | POA: Diagnosis not present

## 2024-03-15 DIAGNOSIS — N3080 Other cystitis without hematuria: Secondary | ICD-10-CM | POA: Diagnosis not present

## 2024-03-15 LAB — RENAL FUNCTION PANEL
Albumin: 1.9 g/dL — ABNORMAL LOW (ref 3.5–5.0)
Anion gap: 9 (ref 5–15)
BUN: 100 mg/dL — ABNORMAL HIGH (ref 8–23)
CO2: 21 mmol/L — ABNORMAL LOW (ref 22–32)
Calcium: 7.6 mg/dL — ABNORMAL LOW (ref 8.9–10.3)
Chloride: 101 mmol/L (ref 98–111)
Creatinine, Ser: 3.72 mg/dL — ABNORMAL HIGH (ref 0.61–1.24)
GFR, Estimated: 15 mL/min — ABNORMAL LOW (ref >60)
Glucose, Bld: 153 mg/dL — ABNORMAL HIGH (ref 70–99)
Phosphorus: 4.1 mg/dL (ref 2.5–4.6)
Potassium: 4.3 mmol/L (ref 3.5–5.1)
Sodium: 131 mmol/L — ABNORMAL LOW (ref 135–145)

## 2024-03-15 LAB — GLUCOSE, CAPILLARY
Glucose-Capillary: 240 mg/dL — ABNORMAL HIGH (ref 70–99)
Glucose-Capillary: 174 mg/dL — ABNORMAL HIGH (ref 70–99)
Glucose-Capillary: 255 mg/dL — ABNORMAL HIGH (ref 70–99)
Glucose-Capillary: 302 mg/dL — ABNORMAL HIGH (ref 70–99)
Glucose-Capillary: 207 mg/dL — ABNORMAL HIGH (ref 70–99)

## 2024-03-15 LAB — CBC
HCT: 30.2 % — ABNORMAL LOW (ref 39.0–52.0)
Hemoglobin: 9.7 g/dL — ABNORMAL LOW (ref 13.0–17.0)
MCH: 28 pg (ref 26.0–34.0)
MCHC: 32.1 g/dL (ref 30.0–36.0)
MCV: 87.3 fL (ref 80.0–100.0)
Platelets: 296 10*3/uL (ref 150–400)
RBC: 3.46 MIL/uL — ABNORMAL LOW (ref 4.22–5.81)
RDW: 14.6 % (ref 11.5–15.5)
WBC: 9.9 10*3/uL (ref 4.0–10.5)
nRBC: 0 % (ref 0.0–0.2)

## 2024-03-15 LAB — URINE CULTURE: Culture: >=100000 — AB

## 2024-03-15 MED ORDER — SODIUM CHLORIDE 0.9 % IV SOLN
2.0000 g | INTRAVENOUS | Status: DC
  Administered 2024-03-15 – 2024-03-18 (×4): 2 g via INTRAVENOUS
  Filled 2024-03-15 (×4): qty 20

## 2024-03-15 NOTE — Plan of Care (Signed)
  Problem: Coping: Goal: Ability to adjust to condition or change in health will improve Outcome: Progressing   Problem: Skin Integrity: Goal: Risk for impaired skin integrity will decrease Outcome: Progressing   Problem: Clinical Measurements: Goal: Will remain free from infection Outcome: Progressing   Problem: Activity: Goal: Risk for activity intolerance will decrease Outcome: Progressing   Problem: Skin Integrity: Goal: Risk for impaired skin integrity will decrease Outcome: Progressing

## 2024-03-15 NOTE — Plan of Care (Signed)
  Problem: Metabolic: Goal: Ability to maintain appropriate glucose levels will improve Outcome: Progressing   Problem: Nutritional: Goal: Maintenance of adequate nutrition will improve Outcome: Progressing   Problem: Clinical Measurements: Goal: Diagnostic test results will improve Outcome: Progressing Goal: Respiratory complications will improve Outcome: Progressing   Problem: Nutrition: Goal: Adequate nutrition will be maintained Outcome: Progressing   Problem: Safety: Goal: Ability to remain free from injury will improve Outcome: Progressing

## 2024-03-15 NOTE — Evaluation (Signed)
 Occupational Therapy Evaluation Patient Details Name: Jorge Butler MRN: [id number] DOB: May 21, 1932 Today's Date: 03/15/2024   History of Present Illness   The pt is a 89 yo male presenting 6/21 after being found down at home (likely around 24 hours). Work up revealed BG in 400s, UTI, and emphysematous cystitis. PMH includes: HTN, HLD, DM II, CKD III, CVA, hypothyroidism, BPH, glaucoma, and  indwelling catheter.     Clinical Impressions Pt admitted for above, PTA pt reports living alone and being fully independent with ADLs/iADls. Pt currently presenting with impaired strength and balance, his cognition is somewhat questionable but could be getting delirious while admitted. Pt requiring mod A to raise into standing and initiate steps forward and laterally but continues to have a posterior hip placement needing multi modal cues to correct. He also requires Max A to CGA for ADLs at this time, displays limited activity tolerance as well. OT to continue following pt acutely to progress pt as able towards his baseline. Patient will benefit from continued inpatient follow up therapy, <3 hours/day      If plan is discharge home, recommend the following:   A lot of help with walking and/or transfers;A lot of help with bathing/dressing/bathroom;Assistance with cooking/housework;Supervision due to cognitive status;Direct supervision/assist for financial management;Direct supervision/assist for medications management     Functional Status Assessment   Patient has had a recent decline in their functional status and demonstrates the ability to make significant improvements in function in a reasonable and predictable amount of time.     Equipment Recommendations   Other (comment) (defer to next level of care)     Recommendations for Other Services         Precautions/Restrictions   Precautions Precautions: Fall Recall of Precautions/Restrictions: Impaired Restrictions Weight  Bearing Restrictions Per Provider Order: No     Mobility Bed Mobility Overal bed mobility: Needs Assistance Bed Mobility: Supine to Sit, Sit to Supine     Supine to sit: Min assist, HOB elevated Sit to supine: Min assist   General bed mobility comments: min A for BLE management, pt able to pull himself into upright sitting with HHA from OT. Returned to bed with min A to assist with clearing LEs over bed.    Transfers Overall transfer level: Needs assistance Equipment used: Rolling walker (2 wheels) Transfers: Sit to/from Stand Sit to Stand: Mod assist           General transfer comment: cues for hand placement, OT providing manual faciliation and gestural cues to shift hips anteriorly and raise chest.      Balance Overall balance assessment: Needs assistance Sitting-balance support: No upper extremity supported Sitting balance-Leahy Scale: Fair Sitting balance - Comments: CGA pt slight posterior lean, needing cues to return feet onto floor. Postural control: Posterior lean Standing balance support: Bilateral upper extremity supported, During functional activity Standing balance-Leahy Scale: Poor                             ADL either performed or assessed with clinical judgement   ADL Overall ADL's : Needs assistance/impaired Eating/Feeding: Independent;Bed level   Grooming: Sitting;Contact guard assist;Brushing hair   Upper Body Bathing: Sitting;Contact guard assist   Lower Body Bathing: Sitting/lateral leans;Minimal assistance   Upper Body Dressing : Sitting;Contact guard assist   Lower Body Dressing: Moderate assistance;Sitting/lateral leans Lower Body Dressing Details (indicate cue type and reason): don socks via backwards chaining.   Statistician Details (indicate  cue type and reason): Recommend +2 mod A to transfer Toileting- Clothing Manipulation and Hygiene: Maximal assistance;Sit to/from stand               Vision Baseline  Vision/History: 1 Wears glasses Vision Assessment?: No apparent visual deficits     Perception         Praxis         Pertinent Vitals/Pain Pain Assessment Pain Assessment: No/denies pain     Extremity/Trunk Assessment Upper Extremity Assessment Upper Extremity Assessment: Generalized weakness (3+/5 bilaterally, ROM WFL)   Lower Extremity Assessment Lower Extremity Assessment: Generalized weakness   Cervical / Trunk Assessment Cervical / Trunk Assessment: Kyphotic   Communication Communication Communication: No apparent difficulties   Cognition Arousal: Alert Behavior During Therapy: WFL for tasks assessed/performed               OT - Cognition Comments: Pt was A&Ox4 on orientation questions but then began to make a comment that he had court yesterday so he didn't get much sleep (pt was still admitted). Once reoriented pt then began to ask so we're in the hospital now                 Following commands: Intact       Cueing  General Comments   Cueing Techniques: Verbal cues      Exercises General Exercises - Lower Extremity Hip Flexion/Marching: Both, 5 reps, Standing, AROM   Shoulder Instructions      Home Living Family/patient expects to be discharged to:: Private residence Living Arrangements: Alone Available Help at Discharge: Family;Available PRN/intermittently Type of Home: Apartment Home Access: Stairs to enter Entrance Stairs-Number of Steps: 3-4 Entrance Stairs-Rails: Right;Left Home Layout: One level     Bathroom Shower/Tub: Chief Strategy Officer: Standard     Home Equipment: Cane - single Librarian, academic (2 wheels)          Prior Functioning/Environment Prior Level of Function : Independent/Modified Independent;Driving             Mobility Comments: pt reports independent, no other falls. does all cooking, medicine management, but is largely sedentary spending his day in recliner ADLs Comments: pt  reports largely independent    OT Problem List: Impaired balance (sitting and/or standing);Decreased cognition;Decreased strength;Decreased knowledge of use of DME or AE   OT Treatment/Interventions: Therapeutic exercise;Self-care/ADL training;Patient/family education;Balance training;Therapeutic activities;DME and/or AE instruction      OT Goals(Current goals can be found in the care plan section)   ADL Goals Pt Will Perform Lower Body Bathing: with set-up;sitting/lateral leans Pt Will Perform Lower Body Dressing: with contact guard assist;sit to/from stand Pt Will Transfer to Toilet: stand pivot transfer;with contact guard assist;bedside commode Pt Will Perform Toileting - Clothing Manipulation and hygiene: with min assist;sit to/from stand   OT Frequency:  Min 2X/week    Co-evaluation              AM-PAC OT 6 Clicks Daily Activity     Outcome Measure Help from another person eating meals?: None Help from another person taking care of personal grooming?: A Little Help from another person toileting, which includes using toliet, bedpan, or urinal?: A Lot Help from another person bathing (including washing, rinsing, drying)?: A Little Help from another person to put on and taking off regular upper body clothing?: A Little Help from another person to put on and taking off regular lower body clothing?: A Lot 6 Click Score: 17   End of  Session Equipment Utilized During Treatment: Gait belt;Rolling walker (2 wheels) Nurse Communication: Mobility status  Activity Tolerance: Patient limited by fatigue Patient left: in bed;with call bell/phone within reach;with bed alarm set  OT Visit Diagnosis: Unsteadiness on feet (R26.81);Other abnormalities of gait and mobility (R26.89);Muscle weakness (generalized) (M62.81)                Time: 1545-1610 OT Time Calculation (min): 25 min Charges:  OT General Charges $OT Visit: 1 Visit OT Evaluation $OT Eval Moderate Complexity: 1  Mod OT Treatments $Therapeutic Activity: 8-22 mins  03/15/2024  AB, OTR/L  Acute Rehabilitation Services  Office: [phone]   Curtistine JONETTA Das 03/15/2024, 5:08 PM

## 2024-03-15 NOTE — Progress Notes (Signed)
 PROGRESS NOTE        PATIENT DETAILS Name: Jorge Butler Age: 89 y.o. Sex: male Date of Birth: May 19, 1932 Admit Date: 03/13/2024 Admitting Physician Marsa KATHEE Scurry, MD ERE:Mlddn, Norleen, MD  Brief Summary: Patient is a 89 y.o.  male with history of CKD stage IIIb, urinary retention requiring indwelling Foley catheter, HTN, DM-2, pituitary adenoma-s/p resection in 1991-brought to the ED by family-as he was found on the floor following a fall-his Foley bag was noted to be empty-upon further evaluation-patient was found to have AKI, emphysematous cystitis.  Significant events: 6/21>> admit to [facility]  Significant studies: 6/21>> CT head: No acute findings 6/21>> CT C-spine: No fracture 6/21>> CT chest/abdomen/pelvis: No traumatic injury-findings consistent with emphysematous cystitis, mild bilateral hydronephrosis.  Significant prostatomegaly.  Significant microbiology data: 6/21>> urine culture: E. coli-susceptibility pending.  Procedures: None  Consults: Nephrology  Subjective: Complains of generalized weakness-but no other complaints.  Objective: Vitals: Blood pressure (!) 130/47, pulse 79, temperature 97.8 F (36.6 C), temperature source Oral, resp. rate 16, height 5' 10 (1.778 m), weight 75.2 kg, SpO2 98%.   Exam: Awake/alert Chest: Clear to auscultation CVS: S1-S2 regular Extremities: No edema Nonfocal exam.  Pertinent Labs/Radiology:    Latest Ref Rng & Units 03/15/2024    5:44 AM 03/14/2024    6:18 AM 03/13/2024    3:16 PM  CBC  WBC 4.0 - 10.5 K/uL 9.9  11.9    Hemoglobin 13.0 - 17.0 g/dL 9.7  9.3  88.0   Hematocrit 39.0 - 52.0 % 30.2  27.7  35.0   Platelets 150 - 400 K/uL 296  292      Lab Results  Component Value Date   NA 131 (L) 03/15/2024   K 4.3 03/15/2024   CL 101 03/15/2024   CO2 21 (L) 03/15/2024      Assessment/Plan: AKI on CKD stage IIIb AKI felt to be multifactorial-secondary to bladder outlet  obstruction-rhabdomyolysis- emphysematous cystitis AKI improving after treatment of underlying etiologies Continue to avoid nephrotoxic agents Follow renal function Nephrology following.  Hyperkalemia Secondary to AKI Resolved  Emphysematous cystitis Continue IV cefepime -await final urine cultures Per prior documentation-Foley bag was empty on initial presentation-likely had bladder outlet obstruction-Foley replaced in the ED-with good urine output. Admitting MD had discussed with urology-apart from bladder drainage with Foley-IV antibiotics no further recommendations.  Mild bilateral hydronephrosis Likely secondary to bladder outlet obstruction-emphysematous cystitis Foley catheter now draining well.  Rhabdomyolysis Secondary to be on the floor for 24 hours following a fall Improved with IVF Follow CK levels periodically.  Normocytic anemia Likely due to combination of CKD/acute illness Follow CBC periodically.  Mechanical fall Multiple imaging studies negative for significant/traumatic injury PT/OT eval-SNF recommended.  HTN BP stable-currently not on any antihypertensives  HLD Statin  DM-2 (A1c 7.0 on 5/7) CBG stable on SSI Oral hypoglycemics remain on hold  Recent Labs    03/14/24 2332 03/15/24 0327 03/15/24 0747  GLUCAP 280* 240* 174*     History of CVA Aspirin /statin.  History of urinary retention-chronic indwelling Foley catheter Foley catheter replaced in the emergency room Remains on finasteride /Flomax    Code status:   Code Status: Limited: Do not attempt resuscitation (DNR) -DNR-LIMITED -Do Not Intubate/DNI    DVT Prophylaxis: heparin  injection 5,000 Units Start: 03/13/24 2200   Family Communication: None at bedside   Disposition Plan: Status is: Inpatient  Remains inpatient appropriate because: Severity of illness   Planned Discharge Destination:SNF   Diet: Diet Order             Diet heart healthy/carb modified Room service  appropriate? Yes; Fluid consistency: Thin  Diet effective now                     Antimicrobial agents: Anti-infectives (From admission, onward)    Start     Dose/Rate Route Frequency Ordered Stop   03/14/24 1700  ceFEPIme  (MAXIPIME ) 1 g in sodium chloride  0.9 % 100 mL IVPB        1 g 200 mL/hr over 30 Minutes Intravenous Every 24 hours 03/13/24 1843     03/13/24 1545  ceFEPIme  (MAXIPIME ) 1 g in sodium chloride  0.9 % 100 mL IVPB        1 g 200 mL/hr over 30 Minutes Intravenous  Once 03/13/24 1533 03/13/24 1806        MEDICATIONS: Scheduled Meds:  aspirin  EC  81 mg Oral Daily   atorvastatin   10 mg Oral Daily   Chlorhexidine  Gluconate Cloth  6 each Topical Daily   finasteride   5 mg Oral Daily   heparin   5,000 Units Subcutaneous Q8H   insulin  aspart  0-9 Units Subcutaneous Q4H   levothyroxine   88 mcg Oral Q0600   sodium chloride  flush  3 mL Intravenous Q12H   tamsulosin   0.4 mg Oral Daily   Continuous Infusions:  ceFEPime  (MAXIPIME ) IV 1 g (03/14/24 1638)   PRN Meds:.acetaminophen  **OR** acetaminophen    I have personally reviewed following labs and imaging studies  LABORATORY DATA: CBC: Recent Labs  Lab 03/13/24 1450 03/13/24 1516 03/14/24 0618 03/15/24 0544  WBC 25.5*  --  11.9* 9.9  HGB 11.0* 11.9* 9.3* 9.7*  HCT 33.2* 35.0* 27.7* 30.2*  MCV 86.2  --  83.9 87.3  PLT 332  --  292 296    Basic Metabolic Panel: Recent Labs  Lab 03/13/24 1450 03/13/24 1516 03/13/24 2122 03/14/24 0125 03/14/24 0618 03/14/24 0942 03/15/24 0544  NA 125*   < > 133* 131* 133* 134* 131*  K 6.9*   < > 5.7* 4.9 4.4 4.3 4.3  CL 89*   < > 100 97* 100 100 101  CO2 15*   < > 18* 18* 18* 18* 21*  GLUCOSE 352*   < > 102* 148* 110* 97 153*  BUN 163*   < > 145* 135* 126* 118* 100*  CREATININE 9.34*   < > 7.52* 6.72* 6.00* 5.49* 3.72*  CALCIUM  8.2*   < > 7.9* 7.7* 7.8* 7.9* 7.6*  MG 2.3  --   --   --   --   --   --   PHOS  --   --   --   --   --   --  4.1   < > = values in  this interval not displayed.    GFR: Estimated Creatinine Clearance: 13.6 mL/min (A) (by C-G formula based on SCr of 3.72 mg/dL (H)).  Liver Function Tests: Recent Labs  Lab 03/13/24 1450 03/15/24 0544  AST 32  --   ALT 21  --   ALKPHOS 102  --   BILITOT 0.9  --   PROT 7.0  --   ALBUMIN 2.4* 1.9*   No results for input(s): LIPASE, AMYLASE in the last 168 hours. No results for input(s): AMMONIA in the last 168 hours.  Coagulation Profile: Recent Labs  Lab 03/13/24  1450  INR 1.1    Cardiac Enzymes: Recent Labs  Lab 03/13/24 1450 03/14/24 0618  CKTOTAL 1,014* 479*    BNP (last 3 results) No results for input(s): PROBNP in the last 8760 hours.  Lipid Profile: No results for input(s): CHOL, HDL, LDLCALC, TRIG, CHOLHDL, LDLDIRECT in the last 72 hours.  Thyroid  Function Tests: No results for input(s): TSH, T4TOTAL, FREET4, T3FREE, THYROIDAB in the last 72 hours.  Anemia Panel: No results for input(s): VITAMINB12, FOLATE, FERRITIN, TIBC, IRON, RETICCTPCT in the last 72 hours.  Urine analysis:    Component Value Date/Time   COLORURINE YELLOW 03/13/2024 1450   APPEARANCEUR TURBID (A) 03/13/2024 1450   LABSPEC 1.010 03/13/2024 1450   PHURINE 6.0 03/13/2024 1450   GLUCOSEU 50 (A) 03/13/2024 1450   HGBUR LARGE (A) 03/13/2024 1450   BILIRUBINUR NEGATIVE 03/13/2024 1450   BILIRUBINUR negative 04/16/2023 1130   KETONESUR NEGATIVE 03/13/2024 1450   PROTEINUR 100 (A) 03/13/2024 1450   UROBILINOGEN 0.2 04/16/2023 1130   NITRITE NEGATIVE 03/13/2024 1450   LEUKOCYTESUR MODERATE (A) 03/13/2024 1450    Sepsis Labs: Lactic Acid, Venous    Component Value Date/Time   LATICACIDVEN 1.9 03/13/2024 1517    MICROBIOLOGY: Recent Results (from the past 240 hours)  Urine Culture     Status: Abnormal (Preliminary result)   Collection Time: 03/13/24  2:50 PM   Specimen: Urine, Random  Result Value Ref Range Status   Specimen  Description URINE, RANDOM  Final   Special Requests NONE Reflexed from [id number]  Final   Culture (A)  Final    >=100,000 COLONIES/mL ESCHERICHIA COLI SUSCEPTIBILITIES TO FOLLOW Performed at [facility], [address] [place], [place] [address]    Report Status PENDING  Incomplete    RADIOLOGY STUDIES/RESULTS: CT CHEST ABDOMEN PELVIS WO CONTRAST Result Date: 03/13/2024 CLINICAL DATA:  Trauma EXAM: CT CHEST, ABDOMEN AND PELVIS WITHOUT CONTRAST TECHNIQUE: Multidetector CT imaging of the chest, abdomen and pelvis was performed following the standard protocol without IV contrast. RADIATION DOSE REDUCTION: This exam was performed according to the departmental dose-optimization program which includes automated exposure control, adjustment of the mA and/or kV according to patient size and/or use of iterative reconstruction technique. COMPARISON:  CT abdomen and pelvis 01/27/2024 FINDINGS: CT CHEST FINDINGS Cardiovascular: No significant vascular findings. Normal heart size. No pericardial effusion. Mediastinum/Nodes: No enlarged mediastinal, hilar, or axillary lymph nodes. Thyroid  gland, trachea, and esophagus demonstrate no significant findings. Lungs/Pleura: There is linear atelectasis in the bilateral lower lobes. The lungs are otherwise clear. There is no pleural effusion or pneumothorax. Musculoskeletal: No acute fractures are seen. CT ABDOMEN PELVIS FINDINGS Hepatobiliary: Gallstones are present. Liver is within normal limits. There is no biliary ductal dilatation. Pancreas: Unremarkable. No pancreatic ductal dilatation or surrounding inflammatory changes. Spleen: Normal in size without focal abnormality. Adrenals/Urinary Tract: There is mild bilateral hydroureteronephrosis to the level of the bladder. No urinary tract calculi are seen. The kidneys are otherwise within normal limits. The adrenal glands are within normal limits. There is diffuse bladder wall thickening. The bladder is decompressed by  Foley catheter. There are findings suspicious for pneumatosis of the bladder wall there is inflammatory stranding surrounding the bladder. Stomach/Bowel: Stomach is within normal limits. No evidence of bowel wall thickening, distention, or inflammatory changes. There are scattered colonic diverticula. Duodenal diverticulum is present. The appendix is not visualized. Vascular/Lymphatic: Aortic atherosclerosis. No enlarged abdominal or pelvic lymph nodes. Reproductive: Prostate gland is significantly enlarged. Other: There is presacral edema. There  is no ascites or free air. There is no focal abdominal wall hernia or hematoma. Musculoskeletal: No acute fractures are seen. IMPRESSION: 1. No acute posttraumatic sequelae in the chest, abdomen or pelvis. 2. Diffuse bladder wall thickening with surrounding inflammatory stranding and findings suspicious for pneumatosis of the bladder wall. 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 3. Mild bilateral hydroureteronephrosis to the level of the bladder likely related to bladder process above. No urinary tract calculi are seen. Follow-up recommended. 4. Significant prostatomegaly. 5. Cholelithiasis. 6. Colonic diverticulosis. Aortic Atherosclerosis (ICD10-I70.0) and Emphysema (ICD10-J43.9). Electronically Signed   By: Greig Pique M.D.   On: 03/13/2024 15:55   CT HEAD WO CONTRAST Result Date: 03/13/2024 CLINICAL DATA:  Head trauma, moderate-severe; Polytrauma, blunt EXAM: CT HEAD WITHOUT CONTRAST CT CERVICAL SPINE WITHOUT CONTRAST TECHNIQUE: Multidetector CT imaging of the head and cervical spine was performed following the standard protocol without intravenous contrast. Multiplanar CT image reconstructions of the cervical spine were also generated. RADIATION DOSE REDUCTION: This exam was performed according to the departmental dose-optimization program which includes automated exposure control, adjustment of the mA and/or kV according to patient size and/or use of  iterative reconstruction technique. COMPARISON:  None Available. FINDINGS: CT HEAD FINDINGS Brain: No evidence of acute infarction, hemorrhage, hydrocephalus, extra-axial collection or mass lesion/mass effect. Vascular: Calcific atherosclerosis.  No hyperdense vessel. Skull: No acute fracture. Sinuses/Orbits: Opacified left sphenoid sinus. No acute orbital findings. Other: No mastoid effusions. CT CERVICAL SPINE FINDINGS Alignment: Mild anterolisthesis of C4 on C5, most likely degenerative. Skull base and vertebrae: No evidence of acute fracture. Soft tissues and spinal canal: No prevertebral fluid or swelling. No visible canal hematoma. Disc levels: Mild-to-moderate multilevel degenerative change. Bony fusion at C2-C3 posteriorly. Upper chest: Visualized lung apices are clear. IMPRESSION: 1. No evidence of acute intracranial abnormality. 2. No evidence of acute fracture or traumatic malalignment in the cervical spine. 3. Opacified left sphenoid sinus. Electronically Signed   By: Gilmore GORMAN Molt M.D.   On: 03/13/2024 15:53   CT CERVICAL SPINE WO CONTRAST Result Date: 03/13/2024 CLINICAL DATA:  Head trauma, moderate-severe; Polytrauma, blunt EXAM: CT HEAD WITHOUT CONTRAST CT CERVICAL SPINE WITHOUT CONTRAST TECHNIQUE: Multidetector CT imaging of the head and cervical spine was performed following the standard protocol without intravenous contrast. Multiplanar CT image reconstructions of the cervical spine were also generated. RADIATION DOSE REDUCTION: This exam was performed according to the departmental dose-optimization program which includes automated exposure control, adjustment of the mA and/or kV according to patient size and/or use of iterative reconstruction technique. COMPARISON:  None Available. FINDINGS: CT HEAD FINDINGS Brain: No evidence of acute infarction, hemorrhage, hydrocephalus, extra-axial collection or mass lesion/mass effect. Vascular: Calcific atherosclerosis.  No hyperdense vessel. Skull:  No acute fracture. Sinuses/Orbits: Opacified left sphenoid sinus. No acute orbital findings. Other: No mastoid effusions. CT CERVICAL SPINE FINDINGS Alignment: Mild anterolisthesis of C4 on C5, most likely degenerative. Skull base and vertebrae: No evidence of acute fracture. Soft tissues and spinal canal: No prevertebral fluid or swelling. No visible canal hematoma. Disc levels: Mild-to-moderate multilevel degenerative change. Bony fusion at C2-C3 posteriorly. Upper chest: Visualized lung apices are clear. IMPRESSION: 1. No evidence of acute intracranial abnormality. 2. No evidence of acute fracture or traumatic malalignment in the cervical spine. 3. Opacified left sphenoid sinus. Electronically Signed   By: Gilmore GORMAN Molt M.D.   On: 03/13/2024 15:53   DG Pelvis Portable Result Date: 03/13/2024 CLINICAL DATA:  Status post fall with right hip pain. EXAM: PORTABLE PELVIS 1 VIEWS COMPARISON:  None Available. FINDINGS: There is no evidence of acute displaced pelvic fracture or diastasis. No pelvic bone lesions are seen. Mild degenerative changes of bilateral hips. Vascular calcifications. IMPRESSION: 1. No acute displaced pelvic fracture or diastasis. 2. Mild degenerative changes of bilateral hips. Electronically Signed   By: Limin  Xu M.D.   On: 03/13/2024 15:16   DG Chest Port 1 View Result Date: 03/13/2024 CLINICAL DATA:  Status post fall EXAM: PORTABLE CHEST 1 VIEW COMPARISON:  Chest radiograph dated 01/27/2024 FINDINGS: Normal lung volumes. No focal consolidations. No pleural effusion or pneumothorax. The heart size and mediastinal contours are within normal limits. No acute osseous abnormality. IMPRESSION: No acute disease. Electronically Signed   By: Limin  Xu M.D.   On: 03/13/2024 15:14     LOS: 2 days   Donalda Applebaum, MD  [facility]    To contact the attending provider between 7A-7P or the covering provider during after hours 7P-7A, please log into the web site [email or web address] and  access using universal [place] password for that web site. If you do not have the password, please call the hospital operator.  03/15/2024, 11:27 AM

## 2024-03-15 NOTE — Progress Notes (Signed)
 Patient ID: Jorge Butler, male   DOB: September 05, 1932, 89 y.o.   MRN: [id number] S: No new complaints.  No events overnight.  O:BP (!) 133/55 (BP Location: Right Arm)   Pulse 91   Temp 97.8 F (36.6 C) (Oral)   Resp 16   Ht 5' 10 (1.778 m)   Wt 75.2 kg   SpO2 98%   BMI 23.79 kg/m   Intake/Output Summary (Last 24 hours) at 03/15/2024 1237 Last data filed at 03/15/2024 1220 Gross per 24 hour  Intake 3 ml  Output 4500 ml  Net -4497 ml   Intake/Output: I/O last 3 completed shifts: In: 3 [I.V.:3] Out: 6250 [Urine:6250]  Intake/Output this shift:  Total I/O In: -  Out: 600 [Urine:600] Weight change:  Gen: NAD CVS: RRR Resp: CTA Abd: +BS, soft, NT/ND Ext: no edema  Recent Labs  Lab 03/13/24 1450 03/13/24 1516 03/13/24 1850 03/13/24 2122 03/14/24 0125 03/14/24 0618 03/14/24 0942 03/15/24 0544  NA 125* 123* 132* 133* 131* 133* 134* 131*  K 6.9* 6.9* 5.3* 5.7* 4.9 4.4 4.3 4.3  CL 89* 93* 96* 100 97* 100 100 101  CO2 15*  --  16* 18* 18* 18* 18* 21*  GLUCOSE 352* 348* 180* 102* 148* 110* 97 153*  BUN 163* >130* 147* 145* 135* 126* 118* 100*  CREATININE 9.34* 10.00* 8.00* 7.52* 6.72* 6.00* 5.49* 3.72*  ALBUMIN 2.4*  --   --   --   --   --   --  1.9*  CALCIUM  8.2*  --  8.0* 7.9* 7.7* 7.8* 7.9* 7.6*  PHOS  --   --   --   --   --   --   --  4.1  AST 32  --   --   --   --   --   --   --   ALT 21  --   --   --   --   --   --   --    Liver Function Tests: Recent Labs  Lab 03/13/24 1450 03/15/24 0544  AST 32  --   ALT 21  --   ALKPHOS 102  --   BILITOT 0.9  --   PROT 7.0  --   ALBUMIN 2.4* 1.9*   No results for input(s): LIPASE, AMYLASE in the last 168 hours. No results for input(s): AMMONIA in the last 168 hours. CBC: Recent Labs  Lab 03/13/24 1450 03/13/24 1516 03/14/24 0618 03/15/24 0544  WBC 25.5*  --  11.9* 9.9  HGB 11.0* 11.9* 9.3* 9.7*  HCT 33.2* 35.0* 27.7* 30.2*  MCV 86.2  --  83.9 87.3  PLT 332  --  292 296   Cardiac Enzymes: Recent  Labs  Lab 03/13/24 1450 03/14/24 0618  CKTOTAL 1,014* 479*   CBG: Recent Labs  Lab 03/14/24 1946 03/14/24 2332 03/15/24 0327 03/15/24 0747 03/15/24 1211  GLUCAP 249* 280* 240* 174* 255*    Iron Studies: No results for input(s): IRON, TIBC, TRANSFERRIN, FERRITIN in the last 72 hours. Studies/Results: CT CHEST ABDOMEN PELVIS WO CONTRAST Result Date: 03/13/2024 CLINICAL DATA:  Trauma EXAM: CT CHEST, ABDOMEN AND PELVIS WITHOUT CONTRAST TECHNIQUE: Multidetector CT imaging of the chest, abdomen and pelvis was performed following the standard protocol without IV contrast. RADIATION DOSE REDUCTION: This exam was performed according to the departmental dose-optimization program which includes automated exposure control, adjustment of the mA and/or kV according to patient size and/or use of iterative reconstruction technique. COMPARISON:  CT abdomen  and pelvis 01/27/2024 FINDINGS: CT CHEST FINDINGS Cardiovascular: No significant vascular findings. Normal heart size. No pericardial effusion. Mediastinum/Nodes: No enlarged mediastinal, hilar, or axillary lymph nodes. Thyroid  gland, trachea, and esophagus demonstrate no significant findings. Lungs/Pleura: There is linear atelectasis in the bilateral lower lobes. The lungs are otherwise clear. There is no pleural effusion or pneumothorax. Musculoskeletal: No acute fractures are seen. CT ABDOMEN PELVIS FINDINGS Hepatobiliary: Gallstones are present. Liver is within normal limits. There is no biliary ductal dilatation. Pancreas: Unremarkable. No pancreatic ductal dilatation or surrounding inflammatory changes. Spleen: Normal in size without focal abnormality. Adrenals/Urinary Tract: There is mild bilateral hydroureteronephrosis to the level of the bladder. No urinary tract calculi are seen. The kidneys are otherwise within normal limits. The adrenal glands are within normal limits. There is diffuse bladder wall thickening. The bladder is decompressed by  Foley catheter. There are findings suspicious for pneumatosis of the bladder wall there is inflammatory stranding surrounding the bladder. Stomach/Bowel: Stomach is within normal limits. No evidence of bowel wall thickening, distention, or inflammatory changes. There are scattered colonic diverticula. Duodenal diverticulum is present. The appendix is not visualized. Vascular/Lymphatic: Aortic atherosclerosis. No enlarged abdominal or pelvic lymph nodes. Reproductive: Prostate gland is significantly enlarged. Other: There is presacral edema. There is no ascites or free air. There is no focal abdominal wall hernia or hematoma. Musculoskeletal: No acute fractures are seen. IMPRESSION: 1. No acute posttraumatic sequelae in the chest, abdomen or pelvis. 2. Diffuse bladder wall thickening with surrounding inflammatory stranding and findings suspicious for pneumatosis of the bladder wall. 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 3. Mild bilateral hydroureteronephrosis to the level of the bladder likely related to bladder process above. No urinary tract calculi are seen. Follow-up recommended. 4. Significant prostatomegaly. 5. Cholelithiasis. 6. Colonic diverticulosis. Aortic Atherosclerosis (ICD10-I70.0) and Emphysema (ICD10-J43.9). Electronically Signed   By: Greig Pique M.D.   On: 03/13/2024 15:55   CT HEAD WO CONTRAST Result Date: 03/13/2024 CLINICAL DATA:  Head trauma, moderate-severe; Polytrauma, blunt EXAM: CT HEAD WITHOUT CONTRAST CT CERVICAL SPINE WITHOUT CONTRAST TECHNIQUE: Multidetector CT imaging of the head and cervical spine was performed following the standard protocol without intravenous contrast. Multiplanar CT image reconstructions of the cervical spine were also generated. RADIATION DOSE REDUCTION: This exam was performed according to the departmental dose-optimization program which includes automated exposure control, adjustment of the mA and/or kV according to patient size and/or use of  iterative reconstruction technique. COMPARISON:  None Available. FINDINGS: CT HEAD FINDINGS Brain: No evidence of acute infarction, hemorrhage, hydrocephalus, extra-axial collection or mass lesion/mass effect. Vascular: Calcific atherosclerosis.  No hyperdense vessel. Skull: No acute fracture. Sinuses/Orbits: Opacified left sphenoid sinus. No acute orbital findings. Other: No mastoid effusions. CT CERVICAL SPINE FINDINGS Alignment: Mild anterolisthesis of C4 on C5, most likely degenerative. Skull base and vertebrae: No evidence of acute fracture. Soft tissues and spinal canal: No prevertebral fluid or swelling. No visible canal hematoma. Disc levels: Mild-to-moderate multilevel degenerative change. Bony fusion at C2-C3 posteriorly. Upper chest: Visualized lung apices are clear. IMPRESSION: 1. No evidence of acute intracranial abnormality. 2. No evidence of acute fracture or traumatic malalignment in the cervical spine. 3. Opacified left sphenoid sinus. Electronically Signed   By: Gilmore GORMAN Molt M.D.   On: 03/13/2024 15:53   CT CERVICAL SPINE WO CONTRAST Result Date: 03/13/2024 CLINICAL DATA:  Head trauma, moderate-severe; Polytrauma, blunt EXAM: CT HEAD WITHOUT CONTRAST CT CERVICAL SPINE WITHOUT CONTRAST TECHNIQUE: Multidetector CT imaging of the head and cervical spine was performed following the  standard protocol without intravenous contrast. Multiplanar CT image reconstructions of the cervical spine were also generated. RADIATION DOSE REDUCTION: This exam was performed according to the departmental dose-optimization program which includes automated exposure control, adjustment of the mA and/or kV according to patient size and/or use of iterative reconstruction technique. COMPARISON:  None Available. FINDINGS: CT HEAD FINDINGS Brain: No evidence of acute infarction, hemorrhage, hydrocephalus, extra-axial collection or mass lesion/mass effect. Vascular: Calcific atherosclerosis.  No hyperdense vessel. Skull:  No acute fracture. Sinuses/Orbits: Opacified left sphenoid sinus. No acute orbital findings. Other: No mastoid effusions. CT CERVICAL SPINE FINDINGS Alignment: Mild anterolisthesis of C4 on C5, most likely degenerative. Skull base and vertebrae: No evidence of acute fracture. Soft tissues and spinal canal: No prevertebral fluid or swelling. No visible canal hematoma. Disc levels: Mild-to-moderate multilevel degenerative change. Bony fusion at C2-C3 posteriorly. Upper chest: Visualized lung apices are clear. IMPRESSION: 1. No evidence of acute intracranial abnormality. 2. No evidence of acute fracture or traumatic malalignment in the cervical spine. 3. Opacified left sphenoid sinus. Electronically Signed   By: Gilmore GORMAN Molt M.D.   On: 03/13/2024 15:53   DG Pelvis Portable Result Date: 03/13/2024 CLINICAL DATA:  Status post fall with right hip pain. EXAM: PORTABLE PELVIS 1 VIEWS COMPARISON:  None Available. FINDINGS: There is no evidence of acute displaced pelvic fracture or diastasis. No pelvic bone lesions are seen. Mild degenerative changes of bilateral hips. Vascular calcifications. IMPRESSION: 1. No acute displaced pelvic fracture or diastasis. 2. Mild degenerative changes of bilateral hips. Electronically Signed   By: Limin  Xu M.D.   On: 03/13/2024 15:16   DG Chest Port 1 View Result Date: 03/13/2024 CLINICAL DATA:  Status post fall EXAM: PORTABLE CHEST 1 VIEW COMPARISON:  Chest radiograph dated 01/27/2024 FINDINGS: Normal lung volumes. No focal consolidations. No pleural effusion or pneumothorax. The heart size and mediastinal contours are within normal limits. No acute osseous abnormality. IMPRESSION: No acute disease. Electronically Signed   By: Limin  Xu M.D.   On: 03/13/2024 15:14    aspirin  EC  81 mg Oral Daily   atorvastatin   10 mg Oral Daily   Chlorhexidine  Gluconate Cloth  6 each Topical Daily   finasteride   5 mg Oral Daily   heparin   5,000 Units Subcutaneous Q8H   insulin  aspart  0-9  Units Subcutaneous Q4H   levothyroxine   88 mcg Oral Q0600   sodium chloride  flush  3 mL Intravenous Q12H   tamsulosin   0.4 mg Oral Daily    BMET    Component Value Date/Time   NA 131 (L) 03/15/2024 0544   K 4.3 03/15/2024 0544   CL 101 03/15/2024 0544   CO2 21 (L) 03/15/2024 0544   GLUCOSE 153 (H) 03/15/2024 0544   BUN 100 (H) 03/15/2024 0544   CREATININE 3.72 (H) 03/15/2024 0544   CALCIUM  7.6 (L) 03/15/2024 0544   GFRNONAA 15 (L) 03/15/2024 0544   CBC    Component Value Date/Time   WBC 9.9 03/15/2024 0544   RBC 3.46 (L) 03/15/2024 0544   HGB 9.7 (L) 03/15/2024 0544   HCT 30.2 (L) 03/15/2024 0544   PLT 296 03/15/2024 0544   MCV 87.3 03/15/2024 0544   MCH 28.0 03/15/2024 0544   MCHC 32.1 03/15/2024 0544   RDW 14.6 03/15/2024 0544   LYMPHSABS 2.2 01/30/2024 0458   MONOABS 1.3 (H) 01/30/2024 0458   EOSABS 0.5 01/30/2024 0458   BASOSABS 0.1 01/30/2024 0458    Assessment/Plan: 89 year old WM with HTN, DM and  CKD.  Now presents with A on CRF in the setting of fall with prolonged immobility and acute bladder outlet obstruction 1.Renal- has fairly advanced CKD at baseline ( crt low 2's). Now with AKI and element of rhabdo, UTI and BOO.  Foley has been replaced and he is nonoliguric, antibiotics as well as high dose IVF being given.   UOP and BUN/Cr have significantly improved over the past 24 hours.  IVF have stopped with better CK.  No indication for dialysis at this time.  Continue to follow closely.  2. Hypertension/volume  - not overloaded-  previous IVF- is better, IVF stopped 3. Hyperkalemia-  quite severe initially but has resolved-  lokelma  stopped  4. Anemia  - not a significant issue at this time-  will likely go down with hydration 5. 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

## 2024-03-16 DIAGNOSIS — N3080 Other cystitis without hematuria: Secondary | ICD-10-CM | POA: Diagnosis not present

## 2024-03-16 DIAGNOSIS — N179 Acute kidney failure, unspecified: Secondary | ICD-10-CM | POA: Diagnosis not present

## 2024-03-16 DIAGNOSIS — E039 Hypothyroidism, unspecified: Secondary | ICD-10-CM | POA: Diagnosis not present

## 2024-03-16 DIAGNOSIS — E119 Type 2 diabetes mellitus without complications: Secondary | ICD-10-CM | POA: Diagnosis not present

## 2024-03-16 LAB — RENAL FUNCTION PANEL
Albumin: 2.1 g/dL — ABNORMAL LOW (ref 3.5–5.0)
Anion gap: 12 (ref 5–15)
BUN: 77 mg/dL — ABNORMAL HIGH (ref 8–23)
CO2: 20 mmol/L — ABNORMAL LOW (ref 22–32)
Calcium: 7.9 mg/dL — ABNORMAL LOW (ref 8.9–10.3)
Chloride: 103 mmol/L (ref 98–111)
Creatinine, Ser: 2.72 mg/dL — ABNORMAL HIGH (ref 0.61–1.24)
GFR, Estimated: 22 mL/min — ABNORMAL LOW (ref >60)
Glucose, Bld: 198 mg/dL — ABNORMAL HIGH (ref 70–99)
Phosphorus: 3.6 mg/dL (ref 2.5–4.6)
Potassium: 4.4 mmol/L (ref 3.5–5.1)
Sodium: 135 mmol/L (ref 135–145)

## 2024-03-16 LAB — GLUCOSE, CAPILLARY
Glucose-Capillary: 126 mg/dL — ABNORMAL HIGH (ref 70–99)
Glucose-Capillary: 141 mg/dL — ABNORMAL HIGH (ref 70–99)
Glucose-Capillary: 154 mg/dL — ABNORMAL HIGH (ref 70–99)
Glucose-Capillary: 172 mg/dL — ABNORMAL HIGH (ref 70–99)
Glucose-Capillary: 247 mg/dL — ABNORMAL HIGH (ref 70–99)
Glucose-Capillary: 274 mg/dL — ABNORMAL HIGH (ref 70–99)
Glucose-Capillary: 277 mg/dL — ABNORMAL HIGH (ref 70–99)

## 2024-03-16 NOTE — Plan of Care (Signed)
  Problem: Education: Goal: Ability to describe self-care measures that may prevent or decrease complications (Diabetes Survival Skills Education) will improve Outcome: Progressing Goal: Individualized Educational Video(s) Outcome: Progressing   Problem: Fluid Volume: Goal: Ability to maintain a balanced intake and output will improve Outcome: Progressing   Problem: Nutritional: Goal: Maintenance of adequate nutrition will improve Outcome: Progressing Goal: Progress toward achieving an optimal weight will improve Outcome: Progressing   Problem: Skin Integrity: Goal: Risk for impaired skin integrity will decrease Outcome: Progressing

## 2024-03-16 NOTE — NC FL2 (Signed)
 [place]  MEDICAID FL2 LEVEL OF CARE FORM     IDENTIFICATION  Patient Name: Jorge Butler Birthdate: 1931/10/05 Sex: male Admission Date (Current Location): 03/13/2024  [facility] [place] and [place] Number:  Producer, television/film/video and Address:  [facility], 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   Uremia 03/13/2024   Acute renal failure superimposed on stage 3b chronic kidney disease (HCC) 03/13/2024   Hyponatremia 03/13/2024   Sepsis (HCC) 01/28/2024   Abdominal pain 01/28/2024   DM2 (diabetes mellitus, type 2) (HCC) 01/28/2024   CKD stage 3b, GFR 30-44 ml/min (HCC) 01/28/2024   Perforated appendicitis 01/27/2024   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   Glaucoma 04/16/2023   Shuffling gait 04/16/2023   Hypertension 04/16/2023   Acquired hypothyroidism 04/16/2023   HLD (hyperlipidemia) 04/16/2023   Elevated PSA 04/16/2023   History of CVA (cerebrovascular accident) 04/16/2023   Hyperbilirubinemia 04/16/2023   Pseudohyponatremia 04/16/2023   Mild protein-calorie malnutrition (HCC) 04/16/2023   Hyperkalemia 04/16/2023   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/16/2023    Orientation RESPIRATION BLADDER Height & Weight     Self, Time, Place, Situation  Normal Incontinent Weight: 165 lb 12.6 oz (75.2 kg) Height:  5' 10 (177.8 cm)  BEHAVIORAL SYMPTOMS/MOOD NEUROLOGICAL BOWEL NUTRITION STATUS      Continent Diet (See DC Summary)  AMBULATORY STATUS COMMUNICATION OF NEEDS Skin   Extensive Assist Verbally PU Stage and Appropriate Care  (Pressure injury coccyx Md stage 2)                       Personal Care Assistance Level of Assistance  Bathing, Dressing, Feeding Bathing Assistance: Maximum assistance Feeding assistance: Limited assistance Dressing Assistance: Maximum assistance     Functional Limitations Info  Sight, Hearing, Speech Sight Info: Impaired Hearing Info: Adequate Speech Info: Adequate    SPECIAL CARE FACTORS FREQUENCY  PT (By licensed PT), OT (By licensed OT)     PT Frequency: 5X/ week OT Frequency: 5X/ week            Contractures Contractures Info: Not present    Additional Factors Info  Code Status, Allergies Code Status Info: DNR-Limited Allergies Info: Sulfa Antibiotics           Current Medications (03/16/2024):  This is the current hospital active medication list Current Facility-Administered Medications  Medication Dose Route Frequency Provider Last Rate Last Admin   acetaminophen  (TYLENOL ) tablet 650 mg  650 mg Oral Q6H PRN Seena Marsa NOVAK, MD       Or   acetaminophen  (TYLENOL ) suppository 650 mg  650 mg Rectal Q6H PRN Seena Marsa NOVAK, MD       aspirin  EC tablet 81 mg  81 mg Oral Daily Melvin, Alexander B, MD   81 mg at 03/16/24 9060   atorvastatin  (LIPITOR) tablet 10 mg  10 mg Oral Daily Melvin, Alexander B, MD   10 mg at 03/16/24 0939   cefTRIAXone (ROCEPHIN) 2 g in sodium chloride  0.9 % 100 mL IVPB  2 g Intravenous Q24H Ghimire,  Donalda HERO, MD   Stopped at 03/15/24 1616   Chlorhexidine  Gluconate Cloth 2 % PADS 6 each  6 each Topical Daily Raenelle Donalda HERO, MD   6 each at 03/16/24 0940   finasteride  (PROSCAR ) tablet 5 mg  5 mg Oral Daily Melvin, Alexander B, MD   5 mg at 03/16/24 9060   heparin  injection 5,000 Units  5,000 Units Subcutaneous Q8H Seena Marsa NOVAK, MD   5,000 Units at 03/16/24 0535   insulin  aspart (novoLOG ) injection 0-9 Units  0-9 Units Subcutaneous Q4H Melvin, Alexander B, MD   2 Units at 03/16/24 0859   levothyroxine  (SYNTHROID ) tablet  88 mcg  88 mcg Oral Q0600 Melvin, Alexander B, MD   88 mcg at 03/16/24 0535   sodium chloride  flush (NS) 0.9 % injection 3 mL  3 mL Intravenous Q12H Seena Marsa NOVAK, MD   3 mL at 03/16/24 0940   tamsulosin  (FLOMAX ) capsule 0.4 mg  0.4 mg Oral Daily Melvin, Alexander B, MD   0.4 mg at 03/16/24 9060     Discharge Medications: Please see discharge summary for a list of discharge medications.  Relevant Imaging Results:  Relevant Lab Results:   Additional Information SSN:[id number]  Jeoffrey LITTIE Moose, LCSW

## 2024-03-16 NOTE — Plan of Care (Signed)

## 2024-03-16 NOTE — Inpatient Diabetes Management (Signed)
 Inpatient Diabetes Program Recommendations  AACE/ADA: New Consensus Statement on Inpatient Glycemic Control (2015)  Target Ranges:  Prepandial:   less than 140 mg/dL      Peak postprandial:   less than 180 mg/dL (1-2 hours)      Critically ill patients:  140 - 180 mg/dL   Lab Results  Component Value Date   GLUCAP 154 (H) 03/16/2024   HGBA1C 7.0 (H) 01/28/2024    Review of Glycemic Control  Latest Reference Range & Units 03/15/24 12:11 03/15/24 16:31 03/15/24 20:03 03/16/24 00:19 03/16/24 03:32 03/16/24 08:32  Glucose-Capillary 70 - 99 mg/dL 744 (H) 697 (H) 792 (H) 126 (H) 172 (H) 154 (H)   Diabetes history: DM 2 Outpatient Diabetes medications:  Glucotrol  5 mg bid Actos 45 mg daily Sitaglipitn 25 mg daily Current orders for Inpatient glycemic control:  Novolog  sensitive (0-9 units) q 4 hours  Inpatient Diabetes Program Recommendations:    Consider reducing Novolog  correction to tid with meals and HS (instead of q 4 hours).   Thanks,  Randall Bullocks, RN, BC-ADM Inpatient Diabetes Coordinator Pager [phone]  (8a-5p)

## 2024-03-16 NOTE — TOC Initial Note (Addendum)
 Transition of Care [facility]) - Initial/Assessment Note    Patient Details  Name: Jorge Butler MRN: [id number] Date of Birth: [birth year]  [facility]) CM/SW Contact:    Jeoffrey LITTIE Moose, LCSW Phone Number: 03/16/2024, 11:13 AM  Clinical Narrative:                 Pt admitted from home due to a fall. Pt lives alone in [place]. CSW spoke with pt about SNF following his dishagre. Pt agreed to SNF, CSW sent SNF referrals, awaiting approvals.     Barriers to Discharge: Continued Medical Work up, SNF Pending bed offer   Patient Goals and CMS Choice Patient states their goals for this hospitalization and ongoing recovery are:: Rehab          Expected Discharge Plan and Services     Post Acute Care Choice: Skilled Nursing Facility Living arrangements for the past 2 months: Single Family Home                                      Prior Living Arrangements/Services Living arrangements for the past 2 months: Single Family Home Lives with:: Self Patient language and need for interpreter reviewed:: Yes Do you feel safe going back to the place where you live?: Yes      Need for Family Participation in Patient Care: No (Comment) Care giver support system in place?: No (comment)   Criminal Activity/Legal Involvement Pertinent to Current Situation/Hospitalization: No - Comment as needed  Activities of Daily Living   ADL Screening (condition at time of admission) Independently performs ADLs?: Yes (appropriate for developmental age) Is the patient deaf or have difficulty hearing?: No Does the patient have difficulty seeing, even when wearing glasses/contacts?: No Does the patient have difficulty concentrating, remembering, or making decisions?: No  Permission Sought/Granted Permission sought to share information with : Facility Industrial/product designer granted to share information with : Yes, Verbal Permission Granted     Permission  granted to share info w AGENCY: SNFs        Emotional Assessment Appearance:: Appears stated age Attitude/Demeanor/Rapport: Engaged Affect (typically observed): Accepting, Happy, Calm Orientation: : Oriented to Self, Oriented to Place, Oriented to  Time, Oriented to Situation Alcohol  / Substance Use: Not Applicable Psych Involvement: No (comment)  Admission diagnosis:  Hyperkalemia [E87.5] Emphysematous cystitis [N30.80] Fall, initial encounter [W19.XXXA] Acute renal failure, unspecified acute renal failure type (HCC) [N17.9] Patient Active Problem List   Diagnosis Date Noted   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   Uremia 03/13/2024   Acute renal failure superimposed on stage 3b chronic kidney disease (HCC) 03/13/2024   Hyponatremia 03/13/2024   Sepsis (HCC) 01/28/2024   Abdominal pain 01/28/2024   DM2 (diabetes mellitus, type 2) (HCC) 01/28/2024   CKD stage 3b, GFR 30-44 ml/min (HCC) 01/28/2024   Perforated appendicitis 01/27/2024   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   Glaucoma 04/16/2023   Shuffling gait 04/16/2023   Hypertension 04/16/2023   Acquired hypothyroidism 04/16/2023   HLD (hyperlipidemia) 04/16/2023   Elevated PSA 04/16/2023   History of CVA (cerebrovascular accident) 04/16/2023   Hyperbilirubinemia 04/16/2023   Pseudohyponatremia 04/16/2023   Mild protein-calorie malnutrition (HCC) 04/16/2023   Hyperkalemia 04/16/2023   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/16/2023   PCP:  Onita Rush, MD Pharmacy:   [facility] [phone] - [place], [place] - [address] AT [facility] [address] [place] [address] Phone: [phone] Fax: [phone]  [facility] - [place], [place] - [address] [place] [address] [place] [address] Phone: [phone] Fax: [phone]     Social Drivers of Health (SDOH) Social History: SDOH Screenings   Food Insecurity: No Food  Insecurity (03/13/2024)  Housing: Low Risk  (03/14/2024)  Transportation Needs: No Transportation Needs (03/13/2024)  Utilities: Not At Risk (03/13/2024)  Social Connections: Socially Isolated (03/14/2024)  Tobacco Use: Low Risk  (03/15/2024)   SDOH Interventions:     Readmission Risk Interventions     No data to display

## 2024-03-16 NOTE — Care Management Important Message (Signed)
 Important Message  Patient Details  Name: Jorge Butler MRN: [id number] Date of Birth: 02/20/1932   Important Message Given:  Yes - Medicare IM     Jon Cruel 03/16/2024, 12:05 PM

## 2024-03-16 NOTE — Progress Notes (Signed)
 Patient ID: Jorge Butler, male   DOB: 1932-09-05, 89 y.o.   MRN: [id number] S: Feels well, no new complaints. O:BP (!) 132/57 (BP Location: Right Arm)   Pulse 82   Temp 97.6 F (36.4 C) (Oral)   Resp (!) 23   Ht 5' 10 (1.778 m)   Wt 75.2 kg   SpO2 98%   BMI 23.79 kg/m   Intake/Output Summary (Last 24 hours) at 03/16/2024 1147 Last data filed at 03/16/2024 0837 Gross per 24 hour  Intake 200 ml  Output 2850 ml  Net -2650 ml   Intake/Output: I/O last 3 completed shifts: In: 203 [I.V.:3; IV Piggyback:200] Out: 4950 [Urine:4950]  Intake/Output this shift:  Total I/O In: -  Out: 650 [Urine:650] Weight change:  Gen: NAD CVS: RRR Resp:CTA Abd: +BS, soft, NT/nD Ext: no edema  Recent Labs  Lab 03/13/24 1450 03/13/24 1516 03/13/24 1850 03/13/24 2122 03/14/24 0125 03/14/24 0618 03/14/24 0942 03/15/24 0544 03/16/24 0534  NA 125*   < > 132* 133* 131* 133* 134* 131* 135  K 6.9*   < > 5.3* 5.7* 4.9 4.4 4.3 4.3 4.4  CL 89*   < > 96* 100 97* 100 100 101 103  CO2 15*  --  16* 18* 18* 18* 18* 21* 20*  GLUCOSE 352*   < > 180* 102* 148* 110* 97 153* 198*  BUN 163*   < > 147* 145* 135* 126* 118* 100* 77*  CREATININE 9.34*   < > 8.00* 7.52* 6.72* 6.00* 5.49* 3.72* 2.72*  ALBUMIN 2.4*  --   --   --   --   --   --  1.9* 2.1*  CALCIUM  8.2*  --  8.0* 7.9* 7.7* 7.8* 7.9* 7.6* 7.9*  PHOS  --   --   --   --   --   --   --  4.1 3.6  AST 32  --   --   --   --   --   --   --   --   ALT 21  --   --   --   --   --   --   --   --    < > = values in this interval not displayed.   Liver Function Tests: Recent Labs  Lab 03/13/24 1450 03/15/24 0544 03/16/24 0534  AST 32  --   --   ALT 21  --   --   ALKPHOS 102  --   --   BILITOT 0.9  --   --   PROT 7.0  --   --   ALBUMIN 2.4* 1.9* 2.1*   No results for input(s): LIPASE, AMYLASE in the last 168 hours. No results for input(s): AMMONIA in the last 168 hours. CBC: Recent Labs  Lab 03/13/24 1450 03/13/24 1516 03/14/24 0618  03/15/24 0544  WBC 25.5*  --  11.9* 9.9  HGB 11.0* 11.9* 9.3* 9.7*  HCT 33.2* 35.0* 27.7* 30.2*  MCV 86.2  --  83.9 87.3  PLT 332  --  292 296   Cardiac Enzymes: Recent Labs  Lab 03/13/24 1450 03/14/24 0618  CKTOTAL 1,014* 479*   CBG: Recent Labs  Lab 03/15/24 1631 03/15/24 2003 03/16/24 0019 03/16/24 0332 03/16/24 0832  GLUCAP 302* 207* 126* 172* 154*    Iron Studies: No results for input(s): IRON, TIBC, TRANSFERRIN, FERRITIN in the last 72 hours. Studies/Results: No results found.  aspirin  EC  81 mg Oral Daily   atorvastatin   10 mg Oral Daily   Chlorhexidine  Gluconate Cloth  6 each Topical Daily   finasteride   5 mg Oral Daily   heparin   5,000 Units Subcutaneous Q8H   insulin  aspart  0-9 Units Subcutaneous Q4H   levothyroxine   88 mcg Oral Q0600   sodium chloride  flush  3 mL Intravenous Q12H   tamsulosin   0.4 mg Oral Daily    BMET    Component Value Date/Time   NA 135 03/16/2024 0534   K 4.4 03/16/2024 0534   CL 103 03/16/2024 0534   CO2 20 (L) 03/16/2024 0534   GLUCOSE 198 (H) 03/16/2024 0534   BUN 77 (H) 03/16/2024 0534   CREATININE 2.72 (H) 03/16/2024 0534   CALCIUM  7.9 (L) 03/16/2024 0534   GFRNONAA 22 (L) 03/16/2024 0534   CBC    Component Value Date/Time   WBC 9.9 03/15/2024 0544   RBC 3.46 (L) 03/15/2024 0544   HGB 9.7 (L) 03/15/2024 0544   HCT 30.2 (L) 03/15/2024 0544   PLT 296 03/15/2024 0544   MCV 87.3 03/15/2024 0544   MCH 28.0 03/15/2024 0544   MCHC 32.1 03/15/2024 0544   RDW 14.6 03/15/2024 0544   LYMPHSABS 2.2 01/30/2024 0458   MONOABS 1.3 (H) 01/30/2024 0458   EOSABS 0.5 01/30/2024 0458   BASOSABS 0.1 01/30/2024 0458     Assessment/Plan: 89 year old WM with HTN, DM and CKD.  Now presents with A on CRF in the setting of fall with prolonged immobility and acute bladder outlet obstruction 1.Renal- has fairly advanced CKD at baseline ( crt low 2's). Now with AKI and element of rhabdo, UTI and BOO.  Foley has been replaced and  he is nonoliguric, antibiotics as well as high dose IVF being given.   UOP and BUN/Cr have significantly improved over the past 48 hours even after IVF's have stopped with better CK.  No indication for dialysis at this time.  Nothing further to add. Will sign off at this time.  Please call with any questions or concerns.  He will need to follow up with our office after discharge in 3-4 weeks. 2. Hypertension/volume  - not overloaded-  previous IVF- is better, IVF stopped 3. Hyperkalemia-  quite severe initially but has resolved-  lokelma  stopped  4. Anemia  - not a significant issue at this time-  will likely go down with hydration 5. 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

## 2024-03-16 NOTE — Progress Notes (Addendum)
 Physical Therapy Treatment Patient Details Name: Jorge Butler MRN: [id number] DOB: 1932-06-25 Today's Date: 03/16/2024   History of Present Illness The pt is a 89 yo male presenting 6/21 after being found down at home (likely around 24 hours). Work up revealed BG in 400s, UTI, and emphysematous cystitis. PMH includes: HTN, HLD, DM II, CKD III, CVA, hypothyroidism, BPH, glaucoma, and  indwelling catheter.    PT Comments  Pt with steady progress towards his physical therapy goals this session. Able to participate in warm up exercises sitting on edge of bed and ambulating ~12 ft with a walker and chair follow. Overall, requiring min assist for functional mobility. Patient will benefit from continued inpatient follow up therapy, <3 hours/day to address further strengthening, balance, and activity tolerance.    If plan is discharge home, recommend the following: A lot of help with walking and/or transfers;A lot of help with bathing/dressing/bathroom;Assistance with cooking/housework;Direct supervision/assist for medications management;Direct supervision/assist for financial management;Assist for transportation;Help with stairs or ramp for entrance;Supervision due to cognitive status   Can travel by private vehicle     No  Equipment Recommendations  Rolling walker (2 wheels);Wheelchair (measurements PT);Wheelchair cushion (measurements PT)    Recommendations for Other Services       Precautions / Restrictions Precautions Precautions: Fall Recall of Precautions/Restrictions: Impaired Restrictions Weight Bearing Restrictions Per Provider Order: No     Mobility  Bed Mobility Overal bed mobility: Needs Assistance Bed Mobility: Supine to Sit     Supine to sit: Min assist     General bed mobility comments: Assist for reaching with RUE to rail, pt able to bring BLE's off edge of bed with cueing. Increased time to achieve upright    Transfers Overall transfer level: Needs  assistance Equipment used: Rolling walker (2 wheels) Transfers: Sit to/from Stand Sit to Stand: Min assist           General transfer comment: MinA to power up from elevated bed height, cues for hand placement    Ambulation/Gait Ambulation/Gait assistance: Min assist, +2 safety/equipment Gait Distance (Feet): 12 Feet Assistive device: Rolling walker (2 wheels) Gait Pattern/deviations: Step-through pattern, Decreased stride length, Trunk flexed Gait velocity: decreased Gait velocity interpretation: <1.31 ft/sec, indicative of household ambulator   General Gait Details: Decreased bilateral foot clearance, assist for steering walker and for forward progression, chair follow.   Stairs             Wheelchair Mobility     Tilt Bed    Modified Rankin (Stroke Patients Only)       Balance Overall balance assessment: Needs assistance Sitting-balance support: No upper extremity supported Sitting balance-Leahy Scale: Fair     Standing balance support: Bilateral upper extremity supported, During functional activity Standing balance-Leahy Scale: Poor                              Communication Communication Communication: No apparent difficulties  Cognition Arousal: Alert Behavior During Therapy: Flat affect   PT - Cognitive impairments: Memory, Awareness, Safety/Judgement, Problem solving, Attention                       PT - Cognition Comments: Pt asking where watch was 5 minutes after PT placed it on table and informed pt Following commands: Impaired Following commands impaired: Follows multi-step commands inconsistently    Cueing Cueing Techniques: Verbal cues  Exercises General Exercises - Upper Extremity Shoulder Flexion: Both,  5 reps, Seated (to 90) General Exercises - Lower Extremity Long Arc Quad: Both, 5 reps, Seated Hip Flexion/Marching: Both, 5 reps, Seated    General Comments        Pertinent Vitals/Pain Pain  Assessment Pain Assessment: No/denies pain    Home Living                          Prior Function            PT Goals (current goals can now be found in the care plan section) Acute Rehab PT Goals Patient Stated Goal: to return home Potential to Achieve Goals: Good Progress towards PT goals: Progressing toward goals    Frequency    Min 2X/week      PT Plan      Co-evaluation              AM-PAC PT 6 Clicks Mobility   Outcome Measure  Help needed turning from your back to your side while in a flat bed without using bedrails?: A Little Help needed moving from lying on your back to sitting on the side of a flat bed without using bedrails?: A Little Help needed moving to and from a bed to a chair (including a wheelchair)?: A Little Help needed standing up from a chair using your arms (e.g., wheelchair or bedside chair)?: A Little Help needed to walk in hospital room?: A Little Help needed climbing 3-5 steps with a railing? : Total 6 Click Score: 16    End of Session Equipment Utilized During Treatment: Gait belt Activity Tolerance: Patient tolerated treatment well Patient left: in chair;with call bell/phone within reach;with chair alarm set Nurse Communication: Mobility status PT Visit Diagnosis: Unsteadiness on feet (R26.81);Muscle weakness (generalized) (M62.81)     Time: 8967-8948 PT Time Calculation (min) (ACUTE ONLY): 19 min  Charges:    $Therapeutic Activity: 8-22 mins PT General Charges $$ ACUTE PT VISIT: 1 Visit                     Aleck Daring, PT, DPT Acute Rehabilitation Services Office [phone]    Alayne ONEIDA Daring 03/16/2024, 1:36 PM

## 2024-03-16 NOTE — Progress Notes (Signed)
 PROGRESS NOTE        PATIENT DETAILS Name: Jorge Butler Age: 89 y.o. Sex: male Date of Birth: 1932/01/13 Admit Date: 03/13/2024 Admitting Physician Marsa KATHEE Scurry, MD ERE:Mlddn, Norleen, MD  Brief Summary: Patient is a 89 y.o.  male with history of CKD stage IIIb, urinary retention requiring indwelling Foley catheter, HTN, DM-2, pituitary adenoma-s/p resection in 1991-brought to the ED by family-as he was found on the floor following a fall-his Foley bag was noted to be empty-upon further evaluation-patient was found to have AKI, emphysematous cystitis.  Significant events: 6/21>> admit to [facility]  Significant studies: 6/21>> CT head: No acute findings 6/21>> CT C-spine: No fracture 6/21>> CT chest/abdomen/pelvis: No traumatic injury-findings consistent with emphysematous cystitis, mild bilateral hydronephrosis.  Significant prostatomegaly.  Significant microbiology data: 6/21>> urine culture: E. coli  Procedures: None  Consults: Nephrology  Subjective: No major issues-lying comfortably in bed.  Objective: Vitals: Blood pressure (!) 132/57, pulse 82, temperature 97.6 F (36.4 C), temperature source Oral, resp. rate (!) 23, height 5' 10 (1.778 m), weight 75.2 kg, SpO2 98%.   Exam: Awake/alert Chest: Clear to auscultation CVS: S1-S2 regular Extremities: No edema Nonfocal exam.  Pertinent Labs/Radiology:    Latest Ref Rng & Units 03/15/2024    5:44 AM 03/14/2024    6:18 AM 03/13/2024    3:16 PM  CBC  WBC 4.0 - 10.5 K/uL 9.9  11.9    Hemoglobin 13.0 - 17.0 g/dL 9.7  9.3  88.0   Hematocrit 39.0 - 52.0 % 30.2  27.7  35.0   Platelets 150 - 400 K/uL 296  292      Lab Results  Component Value Date   NA 135 03/16/2024   K 4.4 03/16/2024   CL 103 03/16/2024   CO2 20 (L) 03/16/2024      Assessment/Plan: AKI on CKD stage IIIb AKI felt to be multifactorial-secondary to bladder outlet obstruction-rhabdomyolysis- emphysematous  cystitis AKI continues to gradually improve Follow renal function Avoid nephrotoxic agents.    Hyperkalemia Secondary to AKI Resolved  Emphysematous cystitis Urine culture positive for E. coli-pansensitive-has been transition from cefepime  to IV ceftriaxone Urine is slowly clearing up Per prior documentation-Foley bag was empty on initial presentation-likely had bladder outlet obstruction-Foley replaced in the ED-with good urine output. Admitting MD had discussed with urology-apart from bladder drainage with Foley-IV antibiotics no further recommendations.  Mild bilateral hydronephrosis Likely secondary to bladder outlet obstruction-emphysematous cystitis Foley catheter now draining well.  Rhabdomyolysis Secondary to be on the floor for 24 hours following a fall Improved with IVF Follow CK levels periodically.  Normocytic anemia Likely due to combination of CKD/acute illness Follow CBC periodically.  Mechanical fall Multiple imaging studies negative for significant/traumatic injury PT/OT eval-SNF recommended.  HTN BP stable-currently not on any antihypertensives  HLD Statin  DM-2 (A1c 7.0 on 5/7) CBG stable on SSI Oral hypoglycemics remain on hold  Recent Labs    03/16/24 0019 03/16/24 0332 03/16/24 0832  GLUCAP 126* 172* 154*     History of CVA Aspirin /statin.  History of urinary retention-chronic indwelling Foley catheter Foley catheter replaced in the emergency room Remains on finasteride /Flomax   Debility/deconditioning Secondary to acute illness PT/OT eval-SNF hopefully by the end of the week.   Code status:   Code Status: Limited: Do not attempt resuscitation (DNR) -DNR-LIMITED -Do Not Intubate/DNI    DVT Prophylaxis:  heparin  injection 5,000 Units Start: 03/13/24 2200   Family Communication: None at bedside   Disposition Plan: Status is: Inpatient Remains inpatient appropriate because: Severity of illness   Planned Discharge  Destination:SNF   Diet: Diet Order             Diet heart healthy/carb modified Room service appropriate? Yes; Fluid consistency: Thin  Diet effective now                     Antimicrobial agents: Anti-infectives (From admission, onward)    Start     Dose/Rate Route Frequency Ordered Stop   03/15/24 1500  cefTRIAXone (ROCEPHIN) 2 g in sodium chloride  0.9 % 100 mL IVPB        2 g 200 mL/hr over 30 Minutes Intravenous Every 24 hours 03/15/24 1405     03/14/24 1700  ceFEPIme  (MAXIPIME ) 1 g in sodium chloride  0.9 % 100 mL IVPB  Status:  Discontinued        1 g 200 mL/hr over 30 Minutes Intravenous Every 24 hours 03/13/24 1843 03/15/24 1405   03/13/24 1545  ceFEPIme  (MAXIPIME ) 1 g in sodium chloride  0.9 % 100 mL IVPB        1 g 200 mL/hr over 30 Minutes Intravenous  Once 03/13/24 1533 03/13/24 1806        MEDICATIONS: Scheduled Meds:  aspirin  EC  81 mg Oral Daily   atorvastatin   10 mg Oral Daily   Chlorhexidine  Gluconate Cloth  6 each Topical Daily   finasteride   5 mg Oral Daily   heparin   5,000 Units Subcutaneous Q8H   insulin  aspart  0-9 Units Subcutaneous Q4H   levothyroxine   88 mcg Oral Q0600   sodium chloride  flush  3 mL Intravenous Q12H   tamsulosin   0.4 mg Oral Daily   Continuous Infusions:  cefTRIAXone (ROCEPHIN)  IV Stopped (03/15/24 1616)   PRN Meds:.acetaminophen  **OR** acetaminophen    I have personally reviewed following labs and imaging studies  LABORATORY DATA: CBC: Recent Labs  Lab 03/13/24 1450 03/13/24 1516 03/14/24 0618 03/15/24 0544  WBC 25.5*  --  11.9* 9.9  HGB 11.0* 11.9* 9.3* 9.7*  HCT 33.2* 35.0* 27.7* 30.2*  MCV 86.2  --  83.9 87.3  PLT 332  --  292 296    Basic Metabolic Panel: Recent Labs  Lab 03/13/24 1450 03/13/24 1516 03/14/24 0125 03/14/24 0618 03/14/24 0942 03/15/24 0544 03/16/24 0534  NA 125*   < > 131* 133* 134* 131* 135  K 6.9*   < > 4.9 4.4 4.3 4.3 4.4  CL 89*   < > 97* 100 100 101 103  CO2 15*   < > 18*  18* 18* 21* 20*  GLUCOSE 352*   < > 148* 110* 97 153* 198*  BUN 163*   < > 135* 126* 118* 100* 77*  CREATININE 9.34*   < > 6.72* 6.00* 5.49* 3.72* 2.72*  CALCIUM  8.2*   < > 7.7* 7.8* 7.9* 7.6* 7.9*  MG 2.3  --   --   --   --   --   --   PHOS  --   --   --   --   --  4.1 3.6   < > = values in this interval not displayed.    GFR: Estimated Creatinine Clearance: 18.6 mL/min (A) (by C-G formula based on SCr of 2.72 mg/dL (H)).  Liver Function Tests: Recent Labs  Lab 03/13/24 1450 03/15/24 0544 03/16/24 0534  AST 32  --   --   ALT 21  --   --   ALKPHOS 102  --   --   BILITOT 0.9  --   --   PROT 7.0  --   --   ALBUMIN 2.4* 1.9* 2.1*   No results for input(s): LIPASE, AMYLASE in the last 168 hours. No results for input(s): AMMONIA in the last 168 hours.  Coagulation Profile: Recent Labs  Lab 03/13/24 1450  INR 1.1    Cardiac Enzymes: Recent Labs  Lab 03/13/24 1450 03/14/24 0618  CKTOTAL 1,014* 479*    BNP (last 3 results) No results for input(s): PROBNP in the last 8760 hours.  Lipid Profile: No results for input(s): CHOL, HDL, LDLCALC, TRIG, CHOLHDL, LDLDIRECT in the last 72 hours.  Thyroid  Function Tests: No results for input(s): TSH, T4TOTAL, FREET4, T3FREE, THYROIDAB in the last 72 hours.  Anemia Panel: No results for input(s): VITAMINB12, FOLATE, FERRITIN, TIBC, IRON, RETICCTPCT in the last 72 hours.  Urine analysis:    Component Value Date/Time   COLORURINE YELLOW 03/13/2024 1450   APPEARANCEUR TURBID (A) 03/13/2024 1450   LABSPEC 1.010 03/13/2024 1450   PHURINE 6.0 03/13/2024 1450   GLUCOSEU 50 (A) 03/13/2024 1450   HGBUR LARGE (A) 03/13/2024 1450   BILIRUBINUR NEGATIVE 03/13/2024 1450   BILIRUBINUR negative 04/16/2023 1130   KETONESUR NEGATIVE 03/13/2024 1450   PROTEINUR 100 (A) 03/13/2024 1450   UROBILINOGEN 0.2 04/16/2023 1130   NITRITE NEGATIVE 03/13/2024 1450   LEUKOCYTESUR MODERATE (A) 03/13/2024  1450    Sepsis Labs: Lactic Acid, Venous    Component Value Date/Time   LATICACIDVEN 1.9 03/13/2024 1517    MICROBIOLOGY: Recent Results (from the past 240 hours)  Urine Culture     Status: Abnormal   Collection Time: 03/13/24  2:50 PM   Specimen: Urine, Random  Result Value Ref Range Status   Specimen Description URINE, RANDOM  Final   Special Requests   Final    NONE Reflexed from [id number] Performed at [facility], [address] [place], [place] [address]    Culture >=100,000 COLONIES/mL ESCHERICHIA COLI (A)  Final   Report Status 03/15/2024 FINAL  Final   Organism ID, Bacteria ESCHERICHIA COLI (A)  Final      Susceptibility   Escherichia coli - MIC*    AMPICILLIN 4 SENSITIVE Sensitive     CEFAZOLIN <=4 SENSITIVE Sensitive     CEFEPIME  <=0.12 SENSITIVE Sensitive     CEFTRIAXONE <=0.25 SENSITIVE Sensitive     CIPROFLOXACIN  <=0.25 SENSITIVE Sensitive     GENTAMICIN <=1 SENSITIVE Sensitive     IMIPENEM <=0.25 SENSITIVE Sensitive     NITROFURANTOIN <=16 SENSITIVE Sensitive     TRIMETH/SULFA <=20 SENSITIVE Sensitive     AMPICILLIN/SULBACTAM <=2 SENSITIVE Sensitive     PIP/TAZO <=4 SENSITIVE Sensitive ug/mL    * >=100,000 COLONIES/mL ESCHERICHIA COLI    RADIOLOGY STUDIES/RESULTS: No results found.    LOS: 3 days   Donalda Applebaum, MD  [facility]    To contact the attending provider between 7A-7P or the covering provider during after hours 7P-7A, please log into the web site [email or web address] and access using universal [place] password for that web site. If you do not have the password, please call the hospital operator.  03/16/2024, 10:49 AM

## 2024-03-17 DIAGNOSIS — N3080 Other cystitis without hematuria: Secondary | ICD-10-CM | POA: Diagnosis not present

## 2024-03-17 DIAGNOSIS — E119 Type 2 diabetes mellitus without complications: Secondary | ICD-10-CM | POA: Diagnosis not present

## 2024-03-17 DIAGNOSIS — E039 Hypothyroidism, unspecified: Secondary | ICD-10-CM | POA: Diagnosis not present

## 2024-03-17 DIAGNOSIS — N179 Acute kidney failure, unspecified: Secondary | ICD-10-CM | POA: Diagnosis not present

## 2024-03-17 LAB — GLUCOSE, CAPILLARY
Glucose-Capillary: 115 mg/dL — ABNORMAL HIGH (ref 70–99)
Glucose-Capillary: 216 mg/dL — ABNORMAL HIGH (ref 70–99)
Glucose-Capillary: 362 mg/dL — ABNORMAL HIGH (ref 70–99)
Glucose-Capillary: 281 mg/dL — ABNORMAL HIGH (ref 70–99)
Glucose-Capillary: 311 mg/dL — ABNORMAL HIGH (ref 70–99)
Glucose-Capillary: 194 mg/dL — ABNORMAL HIGH (ref 70–99)

## 2024-03-17 LAB — BASIC METABOLIC PANEL WITH GFR
Anion gap: 9 (ref 5–15)
BUN: 59 mg/dL — ABNORMAL HIGH (ref 8–23)
CO2: 20 mmol/L — ABNORMAL LOW (ref 22–32)
Calcium: 8 mg/dL — ABNORMAL LOW (ref 8.9–10.3)
Chloride: 101 mmol/L (ref 98–111)
Creatinine, Ser: 2.13 mg/dL — ABNORMAL HIGH (ref 0.61–1.24)
GFR, Estimated: 29 mL/min — ABNORMAL LOW (ref >60)
Glucose, Bld: 315 mg/dL — ABNORMAL HIGH (ref 70–99)
Potassium: 5.3 mmol/L — ABNORMAL HIGH (ref 3.5–5.1)
Sodium: 130 mmol/L — ABNORMAL LOW (ref 135–145)

## 2024-03-17 MED ORDER — INSULIN ASPART 100 UNIT/ML IJ SOLN: 0.0000 [IU] | Freq: Every day | INTRAMUSCULAR | Status: DC

## 2024-03-17 MED ORDER — INSULIN ASPART 100 UNIT/ML IJ SOLN
0.0000 [IU] | Freq: Three times a day (TID) | INTRAMUSCULAR | Status: DC
  Administered 2024-03-18: 2 [IU] via SUBCUTANEOUS
  Administered 2024-03-18: 3 [IU] via SUBCUTANEOUS

## 2024-03-17 MED ORDER — SODIUM ZIRCONIUM CYCLOSILICATE 5 G PO PACK
5.0000 g | PACK | Freq: Every day | ORAL | Status: AC
  Administered 2024-03-17: 5 g via ORAL
  Filled 2024-03-17: qty 1

## 2024-03-17 NOTE — Progress Notes (Signed)
 Physical Therapy Treatment Patient Details Name: Jorge Butler MRN: [id number] DOB: 07-27-1932 Today's Date: 03/17/2024   History of Present Illness The pt is a 89 yo male presenting 6/21 after being found down at home (likely around 24 hours). Work up revealed BG in 400s, UTI, and emphysematous cystitis. PMH includes: HTN, HLD, DM II, CKD III, CVA, hypothyroidism, BPH, glaucoma, and  indwelling catheter.    PT Comments  Pt received in bed, agreeable to participation in therapy. He required mod assist bed mobility, mod assist sit to stand, and mod assist SPT with RW. Pt retropulsive with stance resulting in inability to progress BLE for gait. Able to take small pivot steps bed to recliner. Pt in recliner with feet elevated at end of session.     If plan is discharge home, recommend the following: A lot of help with walking and/or transfers;A lot of help with bathing/dressing/bathroom;Assistance with cooking/housework;Direct supervision/assist for medications management;Direct supervision/assist for financial management;Assist for transportation;Help with stairs or ramp for entrance;Supervision due to cognitive status   Can travel by private vehicle     No  Equipment Recommendations  Rolling walker (2 wheels);Wheelchair (measurements PT);Wheelchair cushion (measurements PT)    Recommendations for Other Services       Precautions / Restrictions Precautions Precautions: Fall;Other (comment) Recall of Precautions/Restrictions: Impaired Precaution/Restrictions Comments: indwelling catheter     Mobility  Bed Mobility Overal bed mobility: Needs Assistance Bed Mobility: Supine to Sit     Supine to sit: Mod assist, HOB elevated     General bed mobility comments: increased time, assist with BLE and trunk    Transfers Overall transfer level: Needs assistance Equipment used: Rolling walker (2 wheels) Transfers: Sit to/from Stand, Bed to chair/wheelchair/BSC Sit to Stand: Mod  assist, From elevated surface   Step pivot transfers: Mod assist       General transfer comment: increased time to power up and stabilize balance. STS x 2 trials from EOB. Retropulsive with initial stance requiring cues for anterior translation of weight.    Ambulation/Gait                   Stairs             Wheelchair Mobility     Tilt Bed    Modified Rankin (Stroke Patients Only)       Balance Overall balance assessment: Needs assistance Sitting-balance support: No upper extremity supported, Feet supported Sitting balance-Leahy Scale: Fair     Standing balance support: Bilateral upper extremity supported, During functional activity, Reliant on assistive device for balance Standing balance-Leahy Scale: Poor                              Communication Communication Communication: No apparent difficulties  Cognition Arousal: Alert Behavior During Therapy: WFL for tasks assessed/performed   PT - Cognitive impairments: Memory, Awareness, Safety/Judgement, Problem solving, Attention                         Following commands: Impaired Following commands impaired: Follows one step commands with increased time    Cueing Cueing Techniques: Verbal cues  Exercises      General Comments        Pertinent Vitals/Pain Pain Assessment Pain Assessment: No/denies pain    Home Living  Prior Function            PT Goals (current goals can now be found in the care plan section) Acute Rehab PT Goals Patient Stated Goal: home Progress towards PT goals: Progressing toward goals    Frequency    Min 2X/week      PT Plan      Co-evaluation              AM-PAC PT 6 Clicks Mobility   Outcome Measure  Help needed turning from your back to your side while in a flat bed without using bedrails?: A Little Help needed moving from lying on your back to sitting on the side of a flat bed  without using bedrails?: A Lot Help needed moving to and from a bed to a chair (including a wheelchair)?: A Lot Help needed standing up from a chair using your arms (e.g., wheelchair or bedside chair)?: A Lot Help needed to walk in hospital room?: Total Help needed climbing 3-5 steps with a railing? : Total 6 Click Score: 11    End of Session Equipment Utilized During Treatment: Gait belt Activity Tolerance: Patient tolerated treatment well Patient left: in chair;with call bell/phone within reach;with chair alarm set Nurse Communication: Mobility status PT Visit Diagnosis: Unsteadiness on feet (R26.81);Muscle weakness (generalized) (M62.81)     Time: 0802-0828 PT Time Calculation (min) (ACUTE ONLY): 26 min  Charges:    $Gait Training: 8-22 mins $Therapeutic Activity: 8-22 mins PT General Charges $$ ACUTE PT VISIT: 1 Visit                     Sari MATSU., PT  Office # [phone]    Erven Sari Shaker 03/17/2024, 9:03 AM

## 2024-03-17 NOTE — Progress Notes (Addendum)
 PROGRESS NOTE        PATIENT DETAILS Name: Jorge Butler Age: 89 y.o. Sex: male Date of Birth: 03/16/1932 Admit Date: 03/13/2024 Admitting Physician Marsa KATHEE Scurry, MD ERE:Mlddn, Norleen, MD  Brief Summary: Patient is a 89 y.o.  male with history of CKD stage IIIb, urinary retention requiring indwelling Foley catheter, HTN, DM-2, pituitary adenoma-s/p resection in 1991-brought to the ED by family-as he was found on the floor following a fall-his Foley bag was noted to be empty-upon further evaluation-patient was found to have AKI, emphysematous cystitis.  Significant events: 6/21>> admit to [facility]  Significant studies: 6/21>> CT head: No acute findings 6/21>> CT C-spine: No fracture 6/21>> CT chest/abdomen/pelvis: No traumatic injury-findings consistent with emphysematous cystitis, mild bilateral hydronephrosis.  Significant prostatomegaly.  Significant microbiology data: 6/21>> urine culture: E. coli  Procedures: None  Consults: Nephrology  Subjective: Lying comfortably in bed-no complaints.  Objective: Vitals: Blood pressure (!) 128/56, pulse 86, temperature 98.9 F (37.2 C), temperature source Oral, resp. rate (!) 22, height 5' 10 (1.778 m), weight 75.2 kg, SpO2 98%.   Exam: Awake/alert Chest: Clear to auscultation CVS: S1-S2 regular Extremities: No edema Nonfocal exam.  Pertinent Labs/Radiology:    Latest Ref Rng & Units 03/15/2024    5:44 AM 03/14/2024    6:18 AM 03/13/2024    3:16 PM  CBC  WBC 4.0 - 10.5 K/uL 9.9  11.9    Hemoglobin 13.0 - 17.0 g/dL 9.7  9.3  88.0   Hematocrit 39.0 - 52.0 % 30.2  27.7  35.0   Platelets 150 - 400 K/uL 296  292      Lab Results  Component Value Date   NA 130 (L) 03/17/2024   K 5.3 (H) 03/17/2024   CL 101 03/17/2024   CO2 20 (L) 03/17/2024      Assessment/Plan: AKI on CKD stage IIIb AKI felt to be multifactorial-secondary to bladder outlet obstruction-rhabdomyolysis- emphysematous  cystitis AKI continues to gradually improve Follow renal function Avoid nephrotoxic agents.    Hyperkalemia Secondary to AKI Reoccurred 6/24-mild-repeat Lokelma  x 1. Repeat electrolytes tomorrow.  Emphysematous cystitis Urine culture positive for E. coli-pansensitive-has been transition from cefepime  to IV ceftriaxone Urine is slowly clearing up Per prior documentation-Foley bag was empty on initial presentation-likely had bladder outlet obstruction-Foley replaced in the ED-with good urine output. Admitting MD had discussed with urology-apart from bladder drainage with Foley-IV antibiotics no further recommendations.  Mild bilateral hydronephrosis Likely secondary to bladder outlet obstruction-emphysematous cystitis Foley catheter now draining well.  Rhabdomyolysis Secondary to be on the floor for 24 hours following a fall Improved with IVF Follow CK levels periodically.  Normocytic anemia Likely due to combination of CKD/acute illness Follow CBC periodically.  Mechanical fall Multiple imaging studies negative for significant/traumatic injury PT/OT eval-SNF recommended.  HTN BP stable-currently not on any antihypertensives  HLD Statin  DM-2 (A1c 7.0 on 5/7) CBG stable on SSI Oral hypoglycemics remain on hold  Recent Labs    03/16/24 2345 03/17/24 0328 03/17/24 0723  GLUCAP 141* 115* 216*     History of CVA Aspirin /statin.  History of urinary retention-chronic indwelling Foley catheter Foley catheter replaced in the emergency room Remains on finasteride /Flomax   Debility/deconditioning Secondary to acute illness PT/OT eval-SNF hopefully by the end of the week.   Code status:   Code Status: Limited: Do not attempt resuscitation (DNR) -DNR-LIMITED -  Do Not Intubate/DNI    DVT Prophylaxis: heparin  injection 5,000 Units Start: 03/13/24 2200   Family Communication:God Son-Chris-[phone] updated over the phone 6/25   Disposition Plan: Status is:  Inpatient Remains inpatient appropriate because: Severity of illness   Planned Discharge Destination:SNF   Diet: Diet Order             Diet heart healthy/carb modified Room service appropriate? Yes; Fluid consistency: Thin  Diet effective now                     Antimicrobial agents: Anti-infectives (From admission, onward)    Start     Dose/Rate Route Frequency Ordered Stop   03/15/24 1500  cefTRIAXone (ROCEPHIN) 2 g in sodium chloride  0.9 % 100 mL IVPB        2 g 200 mL/hr over 30 Minutes Intravenous Every 24 hours 03/15/24 1405     03/14/24 1700  ceFEPIme  (MAXIPIME ) 1 g in sodium chloride  0.9 % 100 mL IVPB  Status:  Discontinued        1 g 200 mL/hr over 30 Minutes Intravenous Every 24 hours 03/13/24 1843 03/15/24 1405   03/13/24 1545  ceFEPIme  (MAXIPIME ) 1 g in sodium chloride  0.9 % 100 mL IVPB        1 g 200 mL/hr over 30 Minutes Intravenous  Once 03/13/24 1533 03/13/24 1806        MEDICATIONS: Scheduled Meds:  aspirin  EC  81 mg Oral Daily   atorvastatin   10 mg Oral Daily   Chlorhexidine  Gluconate Cloth  6 each Topical Daily   finasteride   5 mg Oral Daily   heparin   5,000 Units Subcutaneous Q8H   insulin  aspart  0-9 Units Subcutaneous Q4H   levothyroxine   88 mcg Oral Q0600   sodium chloride  flush  3 mL Intravenous Q12H   sodium zirconium cyclosilicate   5 g Oral Daily   tamsulosin   0.4 mg Oral Daily   Continuous Infusions:  cefTRIAXone (ROCEPHIN)  IV Stopped (03/16/24 1607)   PRN Meds:.acetaminophen  **OR** acetaminophen    I have personally reviewed following labs and imaging studies  LABORATORY DATA: CBC: Recent Labs  Lab 03/13/24 1450 03/13/24 1516 03/14/24 0618 03/15/24 0544  WBC 25.5*  --  11.9* 9.9  HGB 11.0* 11.9* 9.3* 9.7*  HCT 33.2* 35.0* 27.7* 30.2*  MCV 86.2  --  83.9 87.3  PLT 332  --  292 296    Basic Metabolic Panel: Recent Labs  Lab 03/13/24 1450 03/13/24 1516 03/14/24 0618 03/14/24 0942 03/15/24 0544 03/16/24 0534  03/17/24 0835  NA 125*   < > 133* 134* 131* 135 130*  K 6.9*   < > 4.4 4.3 4.3 4.4 5.3*  CL 89*   < > 100 100 101 103 101  CO2 15*   < > 18* 18* 21* 20* 20*  GLUCOSE 352*   < > 110* 97 153* 198* 315*  BUN 163*   < > 126* 118* 100* 77* 59*  CREATININE 9.34*   < > 6.00* 5.49* 3.72* 2.72* 2.13*  CALCIUM  8.2*   < > 7.8* 7.9* 7.6* 7.9* 8.0*  MG 2.3  --   --   --   --   --   --   PHOS  --   --   --   --  4.1 3.6  --    < > = values in this interval not displayed.    GFR: Estimated Creatinine Clearance: 23.8 mL/min (A) (by C-G formula based  on SCr of 2.13 mg/dL (H)).  Liver Function Tests: Recent Labs  Lab 03/13/24 1450 03/15/24 0544 03/16/24 0534  AST 32  --   --   ALT 21  --   --   ALKPHOS 102  --   --   BILITOT 0.9  --   --   PROT 7.0  --   --   ALBUMIN 2.4* 1.9* 2.1*   No results for input(s): LIPASE, AMYLASE in the last 168 hours. No results for input(s): AMMONIA in the last 168 hours.  Coagulation Profile: Recent Labs  Lab 03/13/24 1450  INR 1.1    Cardiac Enzymes: Recent Labs  Lab 03/13/24 1450 03/14/24 0618  CKTOTAL 1,014* 479*    BNP (last 3 results) No results for input(s): PROBNP in the last 8760 hours.  Lipid Profile: No results for input(s): CHOL, HDL, LDLCALC, TRIG, CHOLHDL, LDLDIRECT in the last 72 hours.  Thyroid  Function Tests: No results for input(s): TSH, T4TOTAL, FREET4, T3FREE, THYROIDAB in the last 72 hours.  Anemia Panel: No results for input(s): VITAMINB12, FOLATE, FERRITIN, TIBC, IRON, RETICCTPCT in the last 72 hours.  Urine analysis:    Component Value Date/Time   COLORURINE YELLOW 03/13/2024 1450   APPEARANCEUR TURBID (A) 03/13/2024 1450   LABSPEC 1.010 03/13/2024 1450   PHURINE 6.0 03/13/2024 1450   GLUCOSEU 50 (A) 03/13/2024 1450   HGBUR LARGE (A) 03/13/2024 1450   BILIRUBINUR NEGATIVE 03/13/2024 1450   BILIRUBINUR negative 04/16/2023 1130   KETONESUR NEGATIVE 03/13/2024 1450    PROTEINUR 100 (A) 03/13/2024 1450   UROBILINOGEN 0.2 04/16/2023 1130   NITRITE NEGATIVE 03/13/2024 1450   LEUKOCYTESUR MODERATE (A) 03/13/2024 1450    Sepsis Labs: Lactic Acid, Venous    Component Value Date/Time   LATICACIDVEN 1.9 03/13/2024 1517    MICROBIOLOGY: Recent Results (from the past 240 hours)  Urine Culture     Status: Abnormal   Collection Time: 03/13/24  2:50 PM   Specimen: Urine, Random  Result Value Ref Range Status   Specimen Description URINE, RANDOM  Final   Special Requests   Final    NONE Reflexed from [id number] Performed at [facility], [address], [place], [place] [address]    Culture >=100,000 COLONIES/mL ESCHERICHIA COLI (A)  Final   Report Status 03/15/2024 FINAL  Final   Organism ID, Bacteria ESCHERICHIA COLI (A)  Final      Susceptibility   Escherichia coli - MIC*    AMPICILLIN 4 SENSITIVE Sensitive     CEFAZOLIN <=4 SENSITIVE Sensitive     CEFEPIME  <=0.12 SENSITIVE Sensitive     CEFTRIAXONE <=0.25 SENSITIVE Sensitive     CIPROFLOXACIN  <=0.25 SENSITIVE Sensitive     GENTAMICIN <=1 SENSITIVE Sensitive     IMIPENEM <=0.25 SENSITIVE Sensitive     NITROFURANTOIN <=16 SENSITIVE Sensitive     TRIMETH/SULFA <=20 SENSITIVE Sensitive     AMPICILLIN/SULBACTAM <=2 SENSITIVE Sensitive     PIP/TAZO <=4 SENSITIVE Sensitive ug/mL    * >=100,000 COLONIES/mL ESCHERICHIA COLI    RADIOLOGY STUDIES/RESULTS: No results found.    LOS: 4 days   Donalda Applebaum, MD  [facility]    To contact the attending provider between 7A-7P or the covering provider during after hours 7P-7A, please log into the web site [email or web address] and access using universal [place] password for that web site. If you do not have the password, please call the hospital operator.  03/17/2024, 11:37 AM

## 2024-03-17 NOTE — Inpatient Diabetes Management (Signed)
 Inpatient Diabetes Program Recommendations  AACE/ADA: New Consensus Statement on Inpatient Glycemic Control (2015)  Target Ranges:  Prepandial:   less than 140 mg/dL      Peak postprandial:   less than 180 mg/dL (1-2 hours)      Critically ill patients:  140 - 180 mg/dL   Lab Results  Component Value Date   GLUCAP 362 (H) 03/17/2024   HGBA1C 7.0 (H) 01/28/2024    Review of Glycemic Control  Latest Reference Range & Units 03/16/24 16:24 03/16/24 20:44 03/16/24 23:45 03/17/24 03:28 03/17/24 07:23 03/17/24 11:56  Glucose-Capillary 70 - 99 mg/dL 722 (H) 725 (H) 858 (H) 115 (H) 216 (H) 362 (H)   Diabetes history: DM 2 Outpatient Diabetes medications:  Glucotrol  5 mg bid Actos 45 mg daily Sitaglipitn 25 mg daily Current orders for Inpatient glycemic control:  Novolog  sensitive (0-9 units) q 4 hours   Inpatient Diabetes Program Recommendations:     Consider adding Tradjenta 5 mg daily (this is hospital formulary for Januvia 25 mg daily) and add CHO modified to diet. Also may want to change Novolog  to tid with meals and HS (instead of q 4 hours).   Thanks,  Randall Bullocks, RN, BC-ADM Inpatient Diabetes Coordinator Pager [phone]  (8a-5p)

## 2024-03-17 NOTE — TOC Progression Note (Signed)
 Transition of Care [facility]) - Progression Note    Patient Details  Name: Jorge Butler MRN: [id number] Date of Birth: 1932-06-09  [facility]) CM/SW Contact  Inocente GORMAN Kindle, LCSW Phone Number: 03/17/2024, 3:42 PM  Clinical Narrative:    CSW contacted insurance and switched the SNF choice to [facility].   Expected Discharge Plan: Skilled Nursing Facility Barriers to Discharge: Continued Medical Work up  Expected Discharge Plan and Services In-house Referral: Clinical Social Work   Post Acute Care Choice: Skilled Nursing Facility Living arrangements for the past 2 months: Single Family Home                                       Social Determinants of Health (SDOH) Interventions SDOH Screenings   Food Insecurity: No Food Insecurity (03/13/2024)  Housing: Low Risk  (03/14/2024)  Transportation Needs: No Transportation Needs (03/13/2024)  Utilities: Not At Risk (03/13/2024)  Social Connections: Socially Isolated (03/14/2024)  Tobacco Use: Low Risk  (03/15/2024)    Readmission Risk Interventions     No data to display

## 2024-03-17 NOTE — TOC Progression Note (Signed)
 Transition of Care [facility]) - Progression Note    Patient Details  Name: Jorge Butler MRN: [id number] Date of Birth: 10/05/1931  [facility]) CM/SW Contact  Hazelle Woollard LITTIE Moose, LCSW Phone Number: 03/17/2024, 3:01 PM  Clinical Narrative:    CSW called Dianne (pt friend on facesheet) about questions she had about SNF placement. CSW provided Dianne with the list of approved SNF for pt upon discharge. Dianne stated she would speak with [place], her godson about which SNF they would prefer. Chris's wife, Nat, called CSW about an hour later with questions about the d/c process. CSW answered Nicole's questions about the discharge process to SNF. Nat stated they want pt to go to [facility] instead of [facility]. [facility] has a private room available and Pt is in agreement with the d/c plan.    Expected Discharge Plan: Skilled Nursing Facility Barriers to Discharge: Continued Medical Work up  Expected Discharge Plan and Services In-house Referral: Clinical Social Work   Post Acute Care Choice: Skilled Nursing Facility Living arrangements for the past 2 months: Single Family Home                                       Social Determinants of Health (SDOH) Interventions SDOH Screenings   Food Insecurity: No Food Insecurity (03/13/2024)  Housing: Low Risk  (03/14/2024)  Transportation Needs: No Transportation Needs (03/13/2024)  Utilities: Not At Risk (03/13/2024)  Social Connections: Socially Isolated (03/14/2024)  Tobacco Use: Low Risk  (03/15/2024)    Readmission Risk Interventions     No data to display

## 2024-03-17 NOTE — TOC Progression Note (Signed)
 Transition of Care [facility]) - Progression Note    Patient Details  Name: Jorge Butler MRN: [id number] Date of Birth: 1932/06/06  Transition of Care [facility]) CM/SW Contact  Inocente GORMAN Kindle, LCSW Phone Number: 03/17/2024, 12:35 PM  Clinical Narrative:    Therapist, occupational received for [facility], Ref# [id number], effective 03/18/2024-03/22/2024.   Expected Discharge Plan: Skilled Nursing Facility Barriers to Discharge: Continued Medical Work up  Expected Discharge Plan and Services In-house Referral: Clinical Social Work   Post Acute Care Choice: Skilled Nursing Facility Living arrangements for the past 2 months: Single Family Home                                       Social Determinants of Health (SDOH) Interventions SDOH Screenings   Food Insecurity: No Food Insecurity (03/13/2024)  Housing: Low Risk  (03/14/2024)  Transportation Needs: No Transportation Needs (03/13/2024)  Utilities: Not At Risk (03/13/2024)  Social Connections: Socially Isolated (03/14/2024)  Tobacco Use: Low Risk  (03/15/2024)    Readmission Risk Interventions     No data to display

## 2024-03-17 NOTE — Plan of Care (Signed)

## 2024-03-17 NOTE — TOC Progression Note (Signed)
 Transition of Care [facility]) - Progression Note    Patient Details  Name: Jorge Butler MRN: [id number] Date of Birth: [birth year]  Transition of Care [facility]) CM/SW Contact  Cardarius Senat LITTIE Moose, LCSW Phone Number: 03/17/2024, 12:07 PM  Clinical Narrative:    CSW spoke with pt about SNF placement following his discharge. CSW provided pt with a list of SNFs that have approved him. Pt chose [facility]. CSW confirmed with [facility] that there is a private room available upon pt discharge.    Barriers to Discharge: Continued Medical Work up, SNF Pending bed offer  Expected Discharge Plan and Services     Post Acute Care Choice: Skilled Nursing Facility Living arrangements for the past 2 months: Single Family Home                                       Social Determinants of Health (SDOH) Interventions SDOH Screenings   Food Insecurity: No Food Insecurity (03/13/2024)  Housing: Low Risk  (03/14/2024)  Transportation Needs: No Transportation Needs (03/13/2024)  Utilities: Not At Risk (03/13/2024)  Social Connections: Socially Isolated (03/14/2024)  Tobacco Use: Low Risk  (03/15/2024)    Readmission Risk Interventions     No data to display

## 2024-03-18 DIAGNOSIS — R338 Other retention of urine: Secondary | ICD-10-CM | POA: Diagnosis not present

## 2024-03-18 DIAGNOSIS — I959 Hypotension, unspecified: Secondary | ICD-10-CM | POA: Diagnosis not present

## 2024-03-18 DIAGNOSIS — K5900 Constipation, unspecified: Secondary | ICD-10-CM | POA: Diagnosis not present

## 2024-03-18 DIAGNOSIS — E119 Type 2 diabetes mellitus without complications: Secondary | ICD-10-CM | POA: Diagnosis not present

## 2024-03-18 DIAGNOSIS — W19XXXD Unspecified fall, subsequent encounter: Secondary | ICD-10-CM | POA: Diagnosis not present

## 2024-03-18 DIAGNOSIS — S91302A Unspecified open wound, left foot, initial encounter: Secondary | ICD-10-CM | POA: Diagnosis not present

## 2024-03-18 DIAGNOSIS — E039 Hypothyroidism, unspecified: Secondary | ICD-10-CM | POA: Diagnosis not present

## 2024-03-18 DIAGNOSIS — N183 Chronic kidney disease, stage 3 unspecified: Secondary | ICD-10-CM | POA: Diagnosis not present

## 2024-03-18 DIAGNOSIS — E1165 Type 2 diabetes mellitus with hyperglycemia: Secondary | ICD-10-CM | POA: Diagnosis not present

## 2024-03-18 DIAGNOSIS — L89626 Pressure-induced deep tissue damage of left heel: Secondary | ICD-10-CM | POA: Diagnosis not present

## 2024-03-18 DIAGNOSIS — N390 Urinary tract infection, site not specified: Secondary | ICD-10-CM | POA: Diagnosis not present

## 2024-03-18 DIAGNOSIS — N179 Acute kidney failure, unspecified: Secondary | ICD-10-CM | POA: Diagnosis not present

## 2024-03-18 DIAGNOSIS — N3080 Other cystitis without hematuria: Secondary | ICD-10-CM | POA: Diagnosis not present

## 2024-03-18 DIAGNOSIS — R531 Weakness: Secondary | ICD-10-CM | POA: Diagnosis not present

## 2024-03-18 DIAGNOSIS — L89896 Pressure-induced deep tissue damage of other site: Secondary | ICD-10-CM | POA: Diagnosis not present

## 2024-03-18 DIAGNOSIS — R339 Retention of urine, unspecified: Secondary | ICD-10-CM | POA: Diagnosis not present

## 2024-03-18 DIAGNOSIS — E785 Hyperlipidemia, unspecified: Secondary | ICD-10-CM | POA: Diagnosis not present

## 2024-03-18 DIAGNOSIS — N1832 Chronic kidney disease, stage 3b: Secondary | ICD-10-CM | POA: Diagnosis not present

## 2024-03-18 DIAGNOSIS — R42 Dizziness and giddiness: Secondary | ICD-10-CM | POA: Diagnosis not present

## 2024-03-18 DIAGNOSIS — N400 Enlarged prostate without lower urinary tract symptoms: Secondary | ICD-10-CM | POA: Diagnosis not present

## 2024-03-18 DIAGNOSIS — Z7401 Bed confinement status: Secondary | ICD-10-CM | POA: Diagnosis not present

## 2024-03-18 DIAGNOSIS — L89152 Pressure ulcer of sacral region, stage 2: Secondary | ICD-10-CM | POA: Diagnosis not present

## 2024-03-18 LAB — BASIC METABOLIC PANEL WITH GFR
Anion gap: 6 (ref 5–15)
BUN: 55 mg/dL — ABNORMAL HIGH (ref 8–23)
CO2: 23 mmol/L (ref 22–32)
Calcium: 7.8 mg/dL — ABNORMAL LOW (ref 8.9–10.3)
Chloride: 102 mmol/L (ref 98–111)
Creatinine, Ser: 2.15 mg/dL — ABNORMAL HIGH (ref 0.61–1.24)
GFR, Estimated: 29 mL/min — ABNORMAL LOW (ref >60)
Glucose, Bld: 198 mg/dL — ABNORMAL HIGH (ref 70–99)
Potassium: 4.7 mmol/L (ref 3.5–5.1)
Sodium: 131 mmol/L — ABNORMAL LOW (ref 135–145)

## 2024-03-18 LAB — GLUCOSE, CAPILLARY
Glucose-Capillary: 210 mg/dL — ABNORMAL HIGH (ref 70–99)
Glucose-Capillary: 188 mg/dL — ABNORMAL HIGH (ref 70–99)

## 2024-03-18 MED ORDER — NOVOLOG FLEXPEN 100 UNIT/ML ~~LOC~~ SOPN: PEN_INJECTOR | SUBCUTANEOUS | Status: AC

## 2024-03-18 MED ORDER — AMOXICILLIN 500 MG PO TABS
500.0000 mg | ORAL_TABLET | Freq: Two times a day (BID) | ORAL | Status: AC
Start: 2024-03-19 — End: 2024-03-25

## 2024-03-18 NOTE — Progress Notes (Signed)
 AVS in packet

## 2024-03-18 NOTE — Discharge Summary (Signed)
 PATIENT DETAILS Name: Jorge Butler Age: 89 y.o. Sex: male Date of Birth: 12-11-1931 MRN: [id number]. Admitting Physician: Marsa KATHEE Scurry, MD ERE:Mlddn, Norleen, MD  Admit Date: 03/13/2024 Discharge date: 03/18/2024  Recommendations for Outpatient Follow-up:  Follow up with PCP in 1-2 weeks Please obtain CMP/CBC in one week Please ensure follow-up with urology.  Admitted From:  [facility]  Disposition: Skilled nursing facility   Discharge Condition: fair  CODE STATUS:   Code Status: Limited: Do not attempt resuscitation (DNR) -DNR-LIMITED -Do Not Intubate/DNI    Diet recommendation:  Diet Order             Diet - low sodium heart healthy           Diet Carb Modified           Diet heart healthy/carb modified Room service appropriate? Yes; Fluid consistency: Thin  Diet effective now                    Brief Summary: Patient is a 89 y.o.  male with history of CKD stage IIIb, urinary retention requiring indwelling Foley catheter, HTN, DM-2, pituitary adenoma-s/p resection in 1991-brought to the ED by family-as he was found on the floor following a fall-his Foley bag was noted to be empty-upon further evaluation-patient was found to have AKI, emphysematous cystitis.   Significant events: 6/21>> admit to [facility]   Significant studies: 6/21>> CT head: No acute findings 6/21>> CT C-spine: No fracture 6/21>> CT chest/abdomen/pelvis: No traumatic injury-findings consistent with emphysematous cystitis, mild bilateral hydronephrosis.  Significant prostatomegaly.   Significant microbiology data: 6/21>> urine culture: E. coli   Procedures: None   Consults: Nephrology  Brief Hospital Course: AKI on CKD stage IIIb AKI felt to be multifactorial-secondary to bladder outlet obstruction-rhabdomyolysis- emphysematous cystitis AKI continues to gradually improve-and almost close to baseline. Follow renal function Avoid nephrotoxic agents.   Follow-up with nephrology in  the outpatient setting.   Hyperkalemia Secondary to AKI Has resolved with improvement in renal function and Lokelma .   Emphysematous cystitis Urine culture positive for E. coli-pansensitive-has been transition from cefepime  to IV ceftriaxone Initially urine was purulent but it is cleared up. Per prior documentation-Foley bag was empty on initial presentation-likely had bladder outlet obstruction-Foley replaced in the ED-with good urine output. Admitting MD had discussed with urology-apart from bladder drainage with Foley-IV antibiotics no further recommendations. Has done extremely well-will switch over from IV Rocephin to amoxicillin  on discharge.   Mild bilateral hydronephrosis Likely secondary to bladder outlet obstruction-emphysematous cystitis Foley catheter now draining well. Ensure outpatient follow-up with urology.   Rhabdomyolysis Secondary to be on the floor for 24 hours following a fall Improved with IVF Follow CK levels periodically.   Normocytic anemia Likely due to combination of CKD/acute illness Follow CBC periodically.   Mechanical fall Multiple imaging studies negative for significant/traumatic injury PT/OT eval-SNF recommended.   HTN BP stable-currently not on any antihypertensives   HLD Statin   DM-2 (A1c 7.0 on 5/7) CBG stable on SSI while inpatient Resume Actos/Januvia on discharge Continue to hold glipizide  for now.  History of CVA Aspirin /statin.   History of urinary retention-chronic indwelling Foley catheter Foley catheter replaced in the emergency room-see above Remains on finasteride /Flomax  Needs outpatient follow-up with urology.   Debility/deconditioning Secondary to acute illness PT/OT eval-SNF hopefully by the end of the week.   Discharge Diagnoses:  Principal Problem:   Emphysematous cystitis Active Problems:   Glaucoma   Hypertension   Acquired hypothyroidism  HLD (hyperlipidemia)   History of CVA (cerebrovascular  accident)   Hyperkalemia   DM2 (diabetes mellitus, type 2) (HCC)   Uremia   Acute renal failure superimposed on stage 3b chronic kidney disease (HCC)   Hyponatremia   Discharge Instructions:  Activity:  As tolerated with Full fall precautions use walker/cane & assistance as needed  Discharge Instructions     Call MD for:  extreme fatigue   Complete by: As directed    Call MD for:  persistant dizziness or light-headedness   Complete by: As directed    Diet - low sodium heart healthy   Complete by: As directed    Diet Carb Modified   Complete by: As directed    Discharge instructions   Complete by: As directed    Follow with Primary MD  Onita Rush, MD in 1-2 weeks  Please get a complete blood count and chemistry panel checked by your Primary MD at your next visit, and again as instructed by your Primary MD.  Get Medicines reviewed and adjusted: Please take all your medications with you for your next visit with your Primary MD  Laboratory/radiological data: Please request your Primary MD to go over all hospital tests and procedure/radiological results at the follow up, please ask your Primary MD to get all Hospital records sent to his/her office.  In some cases, they will be blood work, cultures and biopsy results pending at the time of your discharge. Please request that your primary care M.D. follows up on these results.  Also Note the following: If you experience worsening of your admission symptoms, develop shortness of breath, life threatening emergency, suicidal or homicidal thoughts you must seek medical attention immediately by calling 911 or calling your MD immediately  if symptoms less severe.  You must read complete instructions/literature along with all the possible adverse reactions/side effects for all the Medicines you take and that have been prescribed to you. Take any new Medicines after you have completely understood and accpet all the possible adverse  reactions/side effects.   Do not drive when taking Pain medications or sleeping medications (Benzodaizepines)  Do not take more than prescribed Pain, Sleep and Anxiety Medications. It is not advisable to combine anxiety,sleep and pain medications without talking with your primary care practitioner  Special Instructions: If you have smoked or chewed Tobacco  in the last 2 yrs please stop smoking, stop any regular Alcohol   and or any Recreational drug use.  Wear Seat belts while driving.  Please note: You were cared for by a hospitalist during your hospital stay. Once you are discharged, your primary care physician will handle any further medical issues. Please note that NO REFILLS for any discharge medications will be authorized once you are discharged, as it is imperative that you return to your primary care physician (or establish a relationship with a primary care physician if you do not have one) for your post hospital discharge needs so that they can reassess your need for medications and monitor your lab values.   Checks CBGs before meals and at bedtime   Increase activity slowly   Complete by: As directed    No wound care   Complete by: As directed       Allergies as of 03/18/2024       Reactions   Sulfa Antibiotics Nausea And Vomiting        Medication List     STOP taking these medications    glipiZIDE  5 MG tablet Commonly  known as: GLUCOTROL        TAKE these medications    amoxicillin  500 MG tablet Commonly known as: AMOXIL  Take 1 tablet (500 mg total) by mouth 2 (two) times daily for 6 days. Start taking on: March 19, 2024   aspirin  EC 81 MG tablet Take 81 mg by mouth daily.   atorvastatin  20 MG tablet Commonly known as: LIPITOR Take 20 mg by mouth daily.   cyanocobalamin 500 MCG tablet Commonly known as: VITAMIN B12 Take 500 mcg by mouth daily.   finasteride  5 MG tablet Commonly known as: PROSCAR  Take 5 mg by mouth daily.   multivitamin with  minerals Tabs tablet Take 1 tablet by mouth daily.   NovoLOG  FlexPen 100 UNIT/ML FlexPen Generic drug: insulin  aspart 0-9 Units, Subcutaneous, 3 times daily with meals CBG < 70: Implement Hypoglycemia measures CBG 70 - 120: 0 units CBG 121 - 150: 1 unit CBG 151 - 200: 2 units CBG 201 - 250: 3 units CBG 251 - 300: 5 units CBG 301 - 350: 7 units CBG 351 - 400: 9 units CBG > 400: call MD   omega-3 acid ethyl esters 1 g capsule Commonly known as: LOVAZA Take 1 g by mouth daily.   pioglitazone 45 MG tablet Commonly known as: ACTOS Take 45 mg by mouth daily.   polyethylene glycol 17 g packet Commonly known as: MIRALAX  / GLYCOLAX  Take 17 g by mouth daily.   SAW PALMETTO PO Take 1 capsule by mouth daily.   SITagliptin 25 MG Tabs Take 1 tablet by mouth daily.   Synthroid  88 MCG tablet Generic drug: levothyroxine  Take 88 mcg by mouth daily before breakfast.   tamsulosin  0.4 MG Caps capsule Commonly known as: FLOMAX  Take 0.4 mg by mouth daily.   Testosterone  Cypionate 200 MG/ML Sosy Inject 200 mg into the muscle every 21 ( twenty-one) days.        Contact information for follow-up providers     Onita Rush, MD. Schedule an appointment as soon as possible for a visit in 1 week(s).   Specialty: Internal Medicine Contact information: [address] [place] [address] [phone]         [facility]. Schedule an appointment as soon as possible for a visit in 1 week(s).   Contact information: [address] [place] [address] [place]  [address] [phone]             Contact information for after-discharge care     Destination     [facility]   Service: Skilled Nursing Contact information: [address] [place]  [phone]                    Allergies  Allergen Reactions   Sulfa Antibiotics Nausea And Vomiting     Other Procedures/Studies: CT CHEST ABDOMEN PELVIS  WO CONTRAST Result Date: 03/13/2024 CLINICAL DATA:  Trauma EXAM: CT CHEST, ABDOMEN AND PELVIS WITHOUT CONTRAST TECHNIQUE: Multidetector CT imaging of the chest, abdomen and pelvis was performed following the standard protocol without IV contrast. RADIATION DOSE REDUCTION: This exam was performed according to the departmental dose-optimization program which includes automated exposure control, adjustment of the mA and/or kV according to patient size and/or use of iterative reconstruction technique. COMPARISON:  CT abdomen and pelvis 01/27/2024 FINDINGS: CT CHEST FINDINGS Cardiovascular: No significant vascular findings. Normal heart size. No pericardial effusion. Mediastinum/Nodes: No enlarged mediastinal, hilar, or axillary lymph nodes. Thyroid  gland, trachea, and esophagus  demonstrate no significant findings. Lungs/Pleura: There is linear atelectasis in the bilateral lower lobes. The lungs are otherwise clear. There is no pleural effusion or pneumothorax. Musculoskeletal: No acute fractures are seen. CT ABDOMEN PELVIS FINDINGS Hepatobiliary: Gallstones are present. Liver is within normal limits. There is no biliary ductal dilatation. Pancreas: Unremarkable. No pancreatic ductal dilatation or surrounding inflammatory changes. Spleen: Normal in size without focal abnormality. Adrenals/Urinary Tract: There is mild bilateral hydroureteronephrosis to the level of the bladder. No urinary tract calculi are seen. The kidneys are otherwise within normal limits. The adrenal glands are within normal limits. There is diffuse bladder wall thickening. The bladder is decompressed by Foley catheter. There are findings suspicious for pneumatosis of the bladder wall there is inflammatory stranding surrounding the bladder. Stomach/Bowel: Stomach is within normal limits. No evidence of bowel wall thickening, distention, or inflammatory changes. There are scattered colonic diverticula. Duodenal diverticulum is present. The appendix is  not visualized. Vascular/Lymphatic: Aortic atherosclerosis. No enlarged abdominal or pelvic lymph nodes. Reproductive: Prostate gland is significantly enlarged. Other: There is presacral edema. There is no ascites or free air. There is no focal abdominal wall hernia or hematoma. Musculoskeletal: No acute fractures are seen. IMPRESSION: 1. No acute posttraumatic sequelae in the chest, abdomen or pelvis. 2. Diffuse bladder wall thickening with surrounding inflammatory stranding and findings suspicious for pneumatosis of the bladder wall. 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 3. Mild bilateral hydroureteronephrosis to the level of the bladder likely related to bladder process above. No urinary tract calculi are seen. Follow-up recommended. 4. Significant prostatomegaly. 5. Cholelithiasis. 6. Colonic diverticulosis. Aortic Atherosclerosis (ICD10-I70.0) and Emphysema (ICD10-J43.9). Electronically Signed   By: Greig Pique M.D.   On: 03/13/2024 15:55   CT HEAD WO CONTRAST Result Date: 03/13/2024 CLINICAL DATA:  Head trauma, moderate-severe; Polytrauma, blunt EXAM: CT HEAD WITHOUT CONTRAST CT CERVICAL SPINE WITHOUT CONTRAST TECHNIQUE: Multidetector CT imaging of the head and cervical spine was performed following the standard protocol without intravenous contrast. Multiplanar CT image reconstructions of the cervical spine were also generated. RADIATION DOSE REDUCTION: This exam was performed according to the departmental dose-optimization program which includes automated exposure control, adjustment of the mA and/or kV according to patient size and/or use of iterative reconstruction technique. COMPARISON:  None Available. FINDINGS: CT HEAD FINDINGS Brain: No evidence of acute infarction, hemorrhage, hydrocephalus, extra-axial collection or mass lesion/mass effect. Vascular: Calcific atherosclerosis.  No hyperdense vessel. Skull: No acute fracture. Sinuses/Orbits: Opacified left sphenoid sinus. No acute  orbital findings. Other: No mastoid effusions. CT CERVICAL SPINE FINDINGS Alignment: Mild anterolisthesis of C4 on C5, most likely degenerative. Skull base and vertebrae: No evidence of acute fracture. Soft tissues and spinal canal: No prevertebral fluid or swelling. No visible canal hematoma. Disc levels: Mild-to-moderate multilevel degenerative change. Bony fusion at C2-C3 posteriorly. Upper chest: Visualized lung apices are clear. IMPRESSION: 1. No evidence of acute intracranial abnormality. 2. No evidence of acute fracture or traumatic malalignment in the cervical spine. 3. Opacified left sphenoid sinus. Electronically Signed   By: Gilmore GORMAN Molt M.D.   On: 03/13/2024 15:53   CT CERVICAL SPINE WO CONTRAST Result Date: 03/13/2024 CLINICAL DATA:  Head trauma, moderate-severe; Polytrauma, blunt EXAM: CT HEAD WITHOUT CONTRAST CT CERVICAL SPINE WITHOUT CONTRAST TECHNIQUE: Multidetector CT imaging of the head and cervical spine was performed following the standard protocol without intravenous contrast. Multiplanar CT image reconstructions of the cervical spine were also generated. RADIATION DOSE REDUCTION: This exam was performed according to the departmental dose-optimization program which includes automated  exposure control, adjustment of the mA and/or kV according to patient size and/or use of iterative reconstruction technique. COMPARISON:  None Available. FINDINGS: CT HEAD FINDINGS Brain: No evidence of acute infarction, hemorrhage, hydrocephalus, extra-axial collection or mass lesion/mass effect. Vascular: Calcific atherosclerosis.  No hyperdense vessel. Skull: No acute fracture. Sinuses/Orbits: Opacified left sphenoid sinus. No acute orbital findings. Other: No mastoid effusions. CT CERVICAL SPINE FINDINGS Alignment: Mild anterolisthesis of C4 on C5, most likely degenerative. Skull base and vertebrae: No evidence of acute fracture. Soft tissues and spinal canal: No prevertebral fluid or swelling. No  visible canal hematoma. Disc levels: Mild-to-moderate multilevel degenerative change. Bony fusion at C2-C3 posteriorly. Upper chest: Visualized lung apices are clear. IMPRESSION: 1. No evidence of acute intracranial abnormality. 2. No evidence of acute fracture or traumatic malalignment in the cervical spine. 3. Opacified left sphenoid sinus. Electronically Signed   By: Gilmore GORMAN Molt M.D.   On: 03/13/2024 15:53   DG Pelvis Portable Result Date: 03/13/2024 CLINICAL DATA:  Status post fall with right hip pain. EXAM: PORTABLE PELVIS 1 VIEWS COMPARISON:  None Available. FINDINGS: There is no evidence of acute displaced pelvic fracture or diastasis. No pelvic bone lesions are seen. Mild degenerative changes of bilateral hips. Vascular calcifications. IMPRESSION: 1. No acute displaced pelvic fracture or diastasis. 2. Mild degenerative changes of bilateral hips. Electronically Signed   By: Limin  Xu M.D.   On: 03/13/2024 15:16   DG Chest Port 1 View Result Date: 03/13/2024 CLINICAL DATA:  Status post fall EXAM: PORTABLE CHEST 1 VIEW COMPARISON:  Chest radiograph dated 01/27/2024 FINDINGS: Normal lung volumes. No focal consolidations. No pleural effusion or pneumothorax. The heart size and mediastinal contours are within normal limits. No acute osseous abnormality. IMPRESSION: No acute disease. Electronically Signed   By: Limin  Xu M.D.   On: 03/13/2024 15:14     TODAY-DAY OF DISCHARGE:  Subjective:   Jorge Butler today has no headache,no chest abdominal pain,no new weakness tingling or numbness, feels much better wants to go home today.   Objective:   Blood pressure (!) 152/84, pulse 81, temperature 98.2 F (36.8 C), temperature source Oral, resp. rate 16, height 5' 10 (1.778 m), weight 75.2 kg, SpO2 96%.  Intake/Output Summary (Last 24 hours) at 03/18/2024 0947 Last data filed at 03/18/2024 0515 Gross per 24 hour  Intake 443 ml  Output 2925 ml  Net -2482 ml   Filed Weights   03/13/24  1509  Weight: 75.2 kg    Exam: Awake Alert, Oriented *3, No new F.N deficits, Normal affect [place].AT,PERRAL Supple Neck,No JVD, No cervical lymphadenopathy appriciated.  Symmetrical Chest wall movement, Good air movement bilaterally, CTAB RRR,No Gallops,Rubs or new Murmurs, No Parasternal Heave +ve B.Sounds, Abd Soft, Non tender, No organomegaly appriciated, No rebound -guarding or rigidity. No Cyanosis, Clubbing or edema, No new Rash or bruise   PERTINENT RADIOLOGIC STUDIES: No results found.   PERTINENT LAB RESULTS: CBC: No results for input(s): WBC, HGB, HCT, PLT in the last 72 hours. CMET CMP     Component Value Date/Time   NA 131 (L) 03/18/2024 0608   K 4.7 03/18/2024 0608   CL 102 03/18/2024 0608   CO2 23 03/18/2024 0608   GLUCOSE 198 (H) 03/18/2024 0608   BUN 55 (H) 03/18/2024 0608   CREATININE 2.15 (H) 03/18/2024 0608   CALCIUM  7.8 (L) 03/18/2024 0608   PROT 7.0 03/13/2024 1450   ALBUMIN 2.1 (L) 03/16/2024 0534   AST 32 03/13/2024 1450   ALT 21 03/13/2024  1450   ALKPHOS 102 03/13/2024 1450   BILITOT 0.9 03/13/2024 1450   GFRNONAA 29 (L) 03/18/2024 0608    GFR Estimated Creatinine Clearance: 23.6 mL/min (A) (by C-G formula based on SCr of 2.15 mg/dL (H)). No results for input(s): LIPASE, AMYLASE in the last 72 hours. No results for input(s): CKTOTAL, CKMB, CKMBINDEX, TROPONINI in the last 72 hours. Invalid input(s): POCBNP No results for input(s): DDIMER in the last 72 hours. No results for input(s): HGBA1C in the last 72 hours. No results for input(s): CHOL, HDL, LDLCALC, TRIG, CHOLHDL, LDLDIRECT in the last 72 hours. No results for input(s): TSH, T4TOTAL, T3FREE, THYROIDAB in the last 72 hours.  Invalid input(s): FREET3 No results for input(s): VITAMINB12, FOLATE, FERRITIN, TIBC, IRON, RETICCTPCT in the last 72 hours. Coags: No results for input(s): INR in the last 72 hours.  Invalid input(s):  PT Microbiology: Recent Results (from the past 240 hours)  Urine Culture     Status: Abnormal   Collection Time: 03/13/24  2:50 PM   Specimen: Urine, Random  Result Value Ref Range Status   Specimen Description URINE, RANDOM  Final   Special Requests   Final    NONE Reflexed from [id number] Performed at [facility], [address], [place], [place] [address]    Culture >=100,000 COLONIES/mL ESCHERICHIA COLI (A)  Final   Report Status 03/15/2024 FINAL  Final   Organism ID, Bacteria ESCHERICHIA COLI (A)  Final      Susceptibility   Escherichia coli - MIC*    AMPICILLIN 4 SENSITIVE Sensitive     CEFAZOLIN <=4 SENSITIVE Sensitive     CEFEPIME  <=0.12 SENSITIVE Sensitive     CEFTRIAXONE <=0.25 SENSITIVE Sensitive     CIPROFLOXACIN  <=0.25 SENSITIVE Sensitive     GENTAMICIN <=1 SENSITIVE Sensitive     IMIPENEM <=0.25 SENSITIVE Sensitive     NITROFURANTOIN <=16 SENSITIVE Sensitive     TRIMETH/SULFA <=20 SENSITIVE Sensitive     AMPICILLIN/SULBACTAM <=2 SENSITIVE Sensitive     PIP/TAZO <=4 SENSITIVE Sensitive ug/mL    * >=100,000 COLONIES/mL ESCHERICHIA COLI    FURTHER DISCHARGE INSTRUCTIONS:  Get Medicines reviewed and adjusted: Please take all your medications with you for your next visit with your Primary MD  Laboratory/radiological data: Please request your Primary MD to go over all hospital tests and procedure/radiological results at the follow up, please ask your Primary MD to get all Hospital records sent to his/her office.  In some cases, they will be blood work, cultures and biopsy results pending at the time of your discharge. Please request that your primary care M.D. goes through all the records of your hospital data and follows up on these results.  Also Note the following: If you experience worsening of your admission symptoms, develop shortness of breath, life threatening emergency, suicidal or homicidal thoughts you must seek medical attention immediately by  calling 911 or calling your MD immediately  if symptoms less severe.  You must read complete instructions/literature along with all the possible adverse reactions/side effects for all the Medicines you take and that have been prescribed to you. Take any new Medicines after you have completely understood and accpet all the possible adverse reactions/side effects.   Do not drive when taking Pain medications or sleeping medications (Benzodaizepines)  Do not take more than prescribed Pain, Sleep and Anxiety Medications. It is not advisable to combine anxiety,sleep and pain medications without talking with your primary care practitioner  Special Instructions: If you have smoked or  chewed Tobacco  in the last 2 yrs please stop smoking, stop any regular Alcohol   and or any Recreational drug use.  Wear Seat belts while driving.  Please note: You were cared for by a hospitalist during your hospital stay. Once you are discharged, your primary care physician will handle any further medical issues. Please note that NO REFILLS for any discharge medications will be authorized once you are discharged, as it is imperative that you return to your primary care physician (or establish a relationship with a primary care physician if you do not have one) for your post hospital discharge needs so that they can reassess your need for medications and monitor your lab values.  Total Time spent coordinating discharge including counseling, education and face to face time equals greater than 30 minutes.  SignedBETHA Donalda Applebaum 03/18/2024 9:47 AM

## 2024-03-18 NOTE — Progress Notes (Addendum)
 DISCHARGE NOTE [facility] to be discharged Skilled nursing facility to [facility]  per MD order. Patient verbalized understanding.  Skin clean, dry and intact without evidence of skin break down, no evidence of skin tears noted. IV catheter discontinued intact. Site without signs and symptoms of complications. Dressing and pressure applied. Pt denies pain at the site currently. No complaints noted. See Lda for wounds at discharge.  Discharge packet assembled. An After Visit Summary (AVS) was printed and given to the EMS personnel. Patient will beescorted via stretcher and discharged to [facility] via ambulance. Report called to accepting facility; all questions and concerns addressed.   Peyton SHAUNNA Pepper, RN

## 2024-03-18 NOTE — TOC Transition Note (Signed)
 Transition of Care [facility]) - Discharge Note   Patient Details  Name: Jorge Butler MRN: [id number] Date of Birth: Mar 01, 1932  Transition of Care [facility]) CM/SW Contact:  Inocente GORMAN Kindle, LCSW Phone Number: 03/18/2024, 12:10 PM   Clinical Narrative:    Patient will DC to: [facility] Anticipated DC date: 03/18/24 Family notified: God son Medford and his wife Transport by: [place]   Per MD patient ready for DC to [facility]. RN to call report prior to discharge [phone], room 107). RN, patient, patient's family, and facility notified of DC. Discharge Summary and FL2 sent to facility. DC packet on chart including signed DNR. Ambulance transport requested for patient.   CSW will sign off for now as social work intervention is no longer needed. Please consult us  again if new needs arise.     Final next level of care: Skilled Nursing Facility Barriers to Discharge: Barriers Resolved   Patient Goals and CMS Choice Patient states their goals for this hospitalization and ongoing recovery are:: Rehab CMS Medicare.gov Compare Post Acute Care list provided to:: Patient Choice offered to / list presented to : Patient [place] ownership interest in [facility].provided to:: Patient    Discharge Placement   Existing PASRR number confirmed : 03/18/24          Patient chooses bed at: [facility] [place] Patient to be transferred to facility by: [facility] Name of family member notified: Medford and wife Patient and family notified of of transfer: 03/18/24  Discharge Plan and Services Additional resources added to the After Visit Summary for   In-house Referral: Clinical Social Work   Post Acute Care Choice: Skilled Nursing Facility                               Social Drivers of Health (SDOH) Interventions SDOH Screenings   Food Insecurity: No Food Insecurity (03/13/2024)  Housing: Low Risk  (03/14/2024)  Transportation Needs: No Transportation Needs (03/13/2024)   Utilities: Not At Risk (03/13/2024)  Social Connections: Socially Isolated (03/14/2024)  Tobacco Use: Low Risk  (03/15/2024)     Readmission Risk Interventions     No data to display

## 2024-03-18 NOTE — Plan of Care (Signed)

## 2024-03-18 NOTE — Progress Notes (Signed)
 Report given to [facility] LPN at [employer]. IV access removed.

## 2024-03-18 NOTE — Progress Notes (Signed)
 Patient dressed. [facility] here to transport patient

## 2024-03-19 DIAGNOSIS — N183 Chronic kidney disease, stage 3 unspecified: Secondary | ICD-10-CM | POA: Diagnosis not present

## 2024-03-19 DIAGNOSIS — N390 Urinary tract infection, site not specified: Secondary | ICD-10-CM | POA: Diagnosis not present

## 2024-03-19 DIAGNOSIS — N179 Acute kidney failure, unspecified: Secondary | ICD-10-CM | POA: Diagnosis not present

## 2024-03-19 DIAGNOSIS — R339 Retention of urine, unspecified: Secondary | ICD-10-CM | POA: Diagnosis not present

## 2024-03-24 DIAGNOSIS — L89626 Pressure-induced deep tissue damage of left heel: Secondary | ICD-10-CM | POA: Diagnosis not present

## 2024-03-24 DIAGNOSIS — S91302A Unspecified open wound, left foot, initial encounter: Secondary | ICD-10-CM | POA: Diagnosis not present

## 2024-03-24 DIAGNOSIS — L89896 Pressure-induced deep tissue damage of other site: Secondary | ICD-10-CM | POA: Diagnosis not present

## 2024-03-24 DIAGNOSIS — L89152 Pressure ulcer of sacral region, stage 2: Secondary | ICD-10-CM | POA: Diagnosis not present

## 2024-03-26 DIAGNOSIS — N179 Acute kidney failure, unspecified: Secondary | ICD-10-CM | POA: Diagnosis not present

## 2024-03-26 DIAGNOSIS — N183 Chronic kidney disease, stage 3 unspecified: Secondary | ICD-10-CM | POA: Diagnosis not present

## 2024-03-26 DIAGNOSIS — R339 Retention of urine, unspecified: Secondary | ICD-10-CM | POA: Diagnosis not present

## 2024-03-26 DIAGNOSIS — N390 Urinary tract infection, site not specified: Secondary | ICD-10-CM | POA: Diagnosis not present

## 2024-03-30 DIAGNOSIS — R338 Other retention of urine: Secondary | ICD-10-CM | POA: Diagnosis not present

## 2024-03-31 DIAGNOSIS — S91302A Unspecified open wound, left foot, initial encounter: Secondary | ICD-10-CM | POA: Diagnosis not present

## 2024-03-31 DIAGNOSIS — L89626 Pressure-induced deep tissue damage of left heel: Secondary | ICD-10-CM | POA: Diagnosis not present

## 2024-03-31 DIAGNOSIS — L89896 Pressure-induced deep tissue damage of other site: Secondary | ICD-10-CM | POA: Diagnosis not present

## 2024-03-31 DIAGNOSIS — L89152 Pressure ulcer of sacral region, stage 2: Secondary | ICD-10-CM | POA: Diagnosis not present

## 2024-04-02 DIAGNOSIS — R339 Retention of urine, unspecified: Secondary | ICD-10-CM | POA: Diagnosis not present

## 2024-04-02 DIAGNOSIS — N183 Chronic kidney disease, stage 3 unspecified: Secondary | ICD-10-CM | POA: Diagnosis not present

## 2024-04-02 DIAGNOSIS — R42 Dizziness and giddiness: Secondary | ICD-10-CM | POA: Diagnosis not present

## 2024-04-02 DIAGNOSIS — N179 Acute kidney failure, unspecified: Secondary | ICD-10-CM | POA: Diagnosis not present

## 2024-04-07 DIAGNOSIS — L89896 Pressure-induced deep tissue damage of other site: Secondary | ICD-10-CM | POA: Diagnosis not present

## 2024-04-07 DIAGNOSIS — L89152 Pressure ulcer of sacral region, stage 2: Secondary | ICD-10-CM | POA: Diagnosis not present

## 2024-04-07 DIAGNOSIS — L89626 Pressure-induced deep tissue damage of left heel: Secondary | ICD-10-CM | POA: Diagnosis not present

## 2024-04-07 DIAGNOSIS — S91302A Unspecified open wound, left foot, initial encounter: Secondary | ICD-10-CM | POA: Diagnosis not present

## 2024-04-13 DIAGNOSIS — N3080 Other cystitis without hematuria: Secondary | ICD-10-CM | POA: Diagnosis not present

## 2024-04-13 DIAGNOSIS — L89890 Pressure ulcer of other site, unstageable: Secondary | ICD-10-CM | POA: Diagnosis not present

## 2024-04-13 DIAGNOSIS — D631 Anemia in chronic kidney disease: Secondary | ICD-10-CM | POA: Diagnosis not present

## 2024-04-13 DIAGNOSIS — E785 Hyperlipidemia, unspecified: Secondary | ICD-10-CM | POA: Diagnosis not present

## 2024-04-13 DIAGNOSIS — E039 Hypothyroidism, unspecified: Secondary | ICD-10-CM | POA: Diagnosis not present

## 2024-04-13 DIAGNOSIS — Z9181 History of falling: Secondary | ICD-10-CM | POA: Diagnosis not present

## 2024-04-13 DIAGNOSIS — L89620 Pressure ulcer of left heel, unstageable: Secondary | ICD-10-CM | POA: Diagnosis not present

## 2024-04-13 DIAGNOSIS — I129 Hypertensive chronic kidney disease with stage 1 through stage 4 chronic kidney disease, or unspecified chronic kidney disease: Secondary | ICD-10-CM | POA: Diagnosis not present

## 2024-04-13 DIAGNOSIS — E118 Type 2 diabetes mellitus with unspecified complications: Secondary | ICD-10-CM | POA: Diagnosis not present

## 2024-04-13 DIAGNOSIS — E291 Testicular hypofunction: Secondary | ICD-10-CM | POA: Diagnosis not present

## 2024-04-13 DIAGNOSIS — M6282 Rhabdomyolysis: Secondary | ICD-10-CM | POA: Diagnosis not present

## 2024-04-13 DIAGNOSIS — N132 Hydronephrosis with renal and ureteral calculous obstruction: Secondary | ICD-10-CM | POA: Diagnosis not present

## 2024-04-13 DIAGNOSIS — N179 Acute kidney failure, unspecified: Secondary | ICD-10-CM | POA: Diagnosis not present

## 2024-04-13 DIAGNOSIS — H409 Unspecified glaucoma: Secondary | ICD-10-CM | POA: Diagnosis not present

## 2024-04-13 DIAGNOSIS — Z960 Presence of urogenital implants: Secondary | ICD-10-CM | POA: Diagnosis not present

## 2024-04-13 DIAGNOSIS — N401 Enlarged prostate with lower urinary tract symptoms: Secondary | ICD-10-CM | POA: Diagnosis not present

## 2024-04-13 DIAGNOSIS — R627 Adult failure to thrive: Secondary | ICD-10-CM | POA: Diagnosis not present

## 2024-04-13 DIAGNOSIS — E1122 Type 2 diabetes mellitus with diabetic chronic kidney disease: Secondary | ICD-10-CM | POA: Diagnosis not present

## 2024-04-13 DIAGNOSIS — N1832 Chronic kidney disease, stage 3b: Secondary | ICD-10-CM | POA: Diagnosis not present

## 2024-04-13 DIAGNOSIS — R338 Other retention of urine: Secondary | ICD-10-CM | POA: Diagnosis not present

## 2024-04-13 DIAGNOSIS — Z556 Problems related to health literacy: Secondary | ICD-10-CM | POA: Diagnosis not present

## 2024-04-14 DIAGNOSIS — L89890 Pressure ulcer of other site, unstageable: Secondary | ICD-10-CM | POA: Diagnosis not present

## 2024-04-14 DIAGNOSIS — I129 Hypertensive chronic kidney disease with stage 1 through stage 4 chronic kidney disease, or unspecified chronic kidney disease: Secondary | ICD-10-CM | POA: Diagnosis not present

## 2024-04-14 DIAGNOSIS — D631 Anemia in chronic kidney disease: Secondary | ICD-10-CM | POA: Diagnosis not present

## 2024-04-14 DIAGNOSIS — E291 Testicular hypofunction: Secondary | ICD-10-CM | POA: Diagnosis not present

## 2024-04-14 DIAGNOSIS — Z556 Problems related to health literacy: Secondary | ICD-10-CM | POA: Diagnosis not present

## 2024-04-14 DIAGNOSIS — R338 Other retention of urine: Secondary | ICD-10-CM | POA: Diagnosis not present

## 2024-04-14 DIAGNOSIS — E1122 Type 2 diabetes mellitus with diabetic chronic kidney disease: Secondary | ICD-10-CM | POA: Diagnosis not present

## 2024-04-14 DIAGNOSIS — Z9181 History of falling: Secondary | ICD-10-CM | POA: Diagnosis not present

## 2024-04-14 DIAGNOSIS — R627 Adult failure to thrive: Secondary | ICD-10-CM | POA: Diagnosis not present

## 2024-04-14 DIAGNOSIS — N401 Enlarged prostate with lower urinary tract symptoms: Secondary | ICD-10-CM | POA: Diagnosis not present

## 2024-04-14 DIAGNOSIS — E039 Hypothyroidism, unspecified: Secondary | ICD-10-CM | POA: Diagnosis not present

## 2024-04-14 DIAGNOSIS — N1832 Chronic kidney disease, stage 3b: Secondary | ICD-10-CM | POA: Diagnosis not present

## 2024-04-14 DIAGNOSIS — Z960 Presence of urogenital implants: Secondary | ICD-10-CM | POA: Diagnosis not present

## 2024-04-14 DIAGNOSIS — L89620 Pressure ulcer of left heel, unstageable: Secondary | ICD-10-CM | POA: Diagnosis not present

## 2024-04-14 DIAGNOSIS — M6282 Rhabdomyolysis: Secondary | ICD-10-CM | POA: Diagnosis not present

## 2024-04-14 DIAGNOSIS — H409 Unspecified glaucoma: Secondary | ICD-10-CM | POA: Diagnosis not present

## 2024-04-14 DIAGNOSIS — N179 Acute kidney failure, unspecified: Secondary | ICD-10-CM | POA: Diagnosis not present

## 2024-04-14 DIAGNOSIS — N132 Hydronephrosis with renal and ureteral calculous obstruction: Secondary | ICD-10-CM | POA: Diagnosis not present

## 2024-04-14 DIAGNOSIS — N3080 Other cystitis without hematuria: Secondary | ICD-10-CM | POA: Diagnosis not present

## 2024-04-15 DIAGNOSIS — H353211 Exudative age-related macular degeneration, right eye, with active choroidal neovascularization: Secondary | ICD-10-CM | POA: Diagnosis not present

## 2024-04-15 DIAGNOSIS — E113293 Type 2 diabetes mellitus with mild nonproliferative diabetic retinopathy without macular edema, bilateral: Secondary | ICD-10-CM | POA: Diagnosis not present

## 2024-04-15 DIAGNOSIS — H43813 Vitreous degeneration, bilateral: Secondary | ICD-10-CM | POA: Diagnosis not present

## 2024-04-15 DIAGNOSIS — H353123 Nonexudative age-related macular degeneration, left eye, advanced atrophic without subfoveal involvement: Secondary | ICD-10-CM | POA: Diagnosis not present

## 2024-04-15 DIAGNOSIS — H43393 Other vitreous opacities, bilateral: Secondary | ICD-10-CM | POA: Diagnosis not present

## 2024-04-20 DIAGNOSIS — I10 Essential (primary) hypertension: Secondary | ICD-10-CM | POA: Diagnosis not present

## 2024-04-20 DIAGNOSIS — E1329 Other specified diabetes mellitus with other diabetic kidney complication: Secondary | ICD-10-CM | POA: Diagnosis not present

## 2024-04-20 DIAGNOSIS — E785 Hyperlipidemia, unspecified: Secondary | ICD-10-CM | POA: Diagnosis not present

## 2024-04-20 DIAGNOSIS — Z131 Encounter for screening for diabetes mellitus: Secondary | ICD-10-CM | POA: Diagnosis not present

## 2024-04-22 DIAGNOSIS — Z556 Problems related to health literacy: Secondary | ICD-10-CM | POA: Diagnosis not present

## 2024-04-22 DIAGNOSIS — Z960 Presence of urogenital implants: Secondary | ICD-10-CM | POA: Diagnosis not present

## 2024-04-22 DIAGNOSIS — N132 Hydronephrosis with renal and ureteral calculous obstruction: Secondary | ICD-10-CM | POA: Diagnosis not present

## 2024-04-22 DIAGNOSIS — R627 Adult failure to thrive: Secondary | ICD-10-CM | POA: Diagnosis not present

## 2024-04-22 DIAGNOSIS — D631 Anemia in chronic kidney disease: Secondary | ICD-10-CM | POA: Diagnosis not present

## 2024-04-22 DIAGNOSIS — E291 Testicular hypofunction: Secondary | ICD-10-CM | POA: Diagnosis not present

## 2024-04-22 DIAGNOSIS — L89620 Pressure ulcer of left heel, unstageable: Secondary | ICD-10-CM | POA: Diagnosis not present

## 2024-04-22 DIAGNOSIS — N1832 Chronic kidney disease, stage 3b: Secondary | ICD-10-CM | POA: Diagnosis not present

## 2024-04-22 DIAGNOSIS — D649 Anemia, unspecified: Secondary | ICD-10-CM | POA: Diagnosis not present

## 2024-04-22 DIAGNOSIS — N401 Enlarged prostate with lower urinary tract symptoms: Secondary | ICD-10-CM | POA: Diagnosis not present

## 2024-04-22 DIAGNOSIS — E039 Hypothyroidism, unspecified: Secondary | ICD-10-CM | POA: Diagnosis not present

## 2024-04-22 DIAGNOSIS — N179 Acute kidney failure, unspecified: Secondary | ICD-10-CM | POA: Diagnosis not present

## 2024-04-22 DIAGNOSIS — I10 Essential (primary) hypertension: Secondary | ICD-10-CM | POA: Diagnosis not present

## 2024-04-22 DIAGNOSIS — I129 Hypertensive chronic kidney disease with stage 1 through stage 4 chronic kidney disease, or unspecified chronic kidney disease: Secondary | ICD-10-CM | POA: Diagnosis not present

## 2024-04-22 DIAGNOSIS — L89890 Pressure ulcer of other site, unstageable: Secondary | ICD-10-CM | POA: Diagnosis not present

## 2024-04-22 DIAGNOSIS — H409 Unspecified glaucoma: Secondary | ICD-10-CM | POA: Diagnosis not present

## 2024-04-22 DIAGNOSIS — R338 Other retention of urine: Secondary | ICD-10-CM | POA: Diagnosis not present

## 2024-04-22 DIAGNOSIS — M6282 Rhabdomyolysis: Secondary | ICD-10-CM | POA: Diagnosis not present

## 2024-04-22 DIAGNOSIS — N3080 Other cystitis without hematuria: Secondary | ICD-10-CM | POA: Diagnosis not present

## 2024-04-22 DIAGNOSIS — E1122 Type 2 diabetes mellitus with diabetic chronic kidney disease: Secondary | ICD-10-CM | POA: Diagnosis not present

## 2024-04-22 DIAGNOSIS — Z9181 History of falling: Secondary | ICD-10-CM | POA: Diagnosis not present

## 2024-04-23 DIAGNOSIS — L89620 Pressure ulcer of left heel, unstageable: Secondary | ICD-10-CM | POA: Diagnosis not present

## 2024-04-23 DIAGNOSIS — N132 Hydronephrosis with renal and ureteral calculous obstruction: Secondary | ICD-10-CM | POA: Diagnosis not present

## 2024-04-23 DIAGNOSIS — L89890 Pressure ulcer of other site, unstageable: Secondary | ICD-10-CM | POA: Diagnosis not present

## 2024-04-23 DIAGNOSIS — Z556 Problems related to health literacy: Secondary | ICD-10-CM | POA: Diagnosis not present

## 2024-04-23 DIAGNOSIS — E1122 Type 2 diabetes mellitus with diabetic chronic kidney disease: Secondary | ICD-10-CM | POA: Diagnosis not present

## 2024-04-23 DIAGNOSIS — E291 Testicular hypofunction: Secondary | ICD-10-CM | POA: Diagnosis not present

## 2024-04-23 DIAGNOSIS — N179 Acute kidney failure, unspecified: Secondary | ICD-10-CM | POA: Diagnosis not present

## 2024-04-23 DIAGNOSIS — D631 Anemia in chronic kidney disease: Secondary | ICD-10-CM | POA: Diagnosis not present

## 2024-04-23 DIAGNOSIS — R627 Adult failure to thrive: Secondary | ICD-10-CM | POA: Diagnosis not present

## 2024-04-23 DIAGNOSIS — N3080 Other cystitis without hematuria: Secondary | ICD-10-CM | POA: Diagnosis not present

## 2024-04-23 DIAGNOSIS — H409 Unspecified glaucoma: Secondary | ICD-10-CM | POA: Diagnosis not present

## 2024-04-23 DIAGNOSIS — E039 Hypothyroidism, unspecified: Secondary | ICD-10-CM | POA: Diagnosis not present

## 2024-04-23 DIAGNOSIS — M6282 Rhabdomyolysis: Secondary | ICD-10-CM | POA: Diagnosis not present

## 2024-04-23 DIAGNOSIS — Z9181 History of falling: Secondary | ICD-10-CM | POA: Diagnosis not present

## 2024-04-23 DIAGNOSIS — R338 Other retention of urine: Secondary | ICD-10-CM | POA: Diagnosis not present

## 2024-04-23 DIAGNOSIS — Z960 Presence of urogenital implants: Secondary | ICD-10-CM | POA: Diagnosis not present

## 2024-04-23 DIAGNOSIS — I129 Hypertensive chronic kidney disease with stage 1 through stage 4 chronic kidney disease, or unspecified chronic kidney disease: Secondary | ICD-10-CM | POA: Diagnosis not present

## 2024-04-23 DIAGNOSIS — N1832 Chronic kidney disease, stage 3b: Secondary | ICD-10-CM | POA: Diagnosis not present

## 2024-04-23 DIAGNOSIS — N401 Enlarged prostate with lower urinary tract symptoms: Secondary | ICD-10-CM | POA: Diagnosis not present

## 2024-04-27 DIAGNOSIS — N1832 Chronic kidney disease, stage 3b: Secondary | ICD-10-CM | POA: Diagnosis not present

## 2024-04-27 DIAGNOSIS — Z960 Presence of urogenital implants: Secondary | ICD-10-CM | POA: Diagnosis not present

## 2024-04-27 DIAGNOSIS — I129 Hypertensive chronic kidney disease with stage 1 through stage 4 chronic kidney disease, or unspecified chronic kidney disease: Secondary | ICD-10-CM | POA: Diagnosis not present

## 2024-04-27 DIAGNOSIS — H409 Unspecified glaucoma: Secondary | ICD-10-CM | POA: Diagnosis not present

## 2024-04-27 DIAGNOSIS — E291 Testicular hypofunction: Secondary | ICD-10-CM | POA: Diagnosis not present

## 2024-04-27 DIAGNOSIS — R338 Other retention of urine: Secondary | ICD-10-CM | POA: Diagnosis not present

## 2024-04-27 DIAGNOSIS — Z9181 History of falling: Secondary | ICD-10-CM | POA: Diagnosis not present

## 2024-04-27 DIAGNOSIS — M6282 Rhabdomyolysis: Secondary | ICD-10-CM | POA: Diagnosis not present

## 2024-04-27 DIAGNOSIS — N179 Acute kidney failure, unspecified: Secondary | ICD-10-CM | POA: Diagnosis not present

## 2024-04-27 DIAGNOSIS — D631 Anemia in chronic kidney disease: Secondary | ICD-10-CM | POA: Diagnosis not present

## 2024-04-27 DIAGNOSIS — L89890 Pressure ulcer of other site, unstageable: Secondary | ICD-10-CM | POA: Diagnosis not present

## 2024-04-27 DIAGNOSIS — N3080 Other cystitis without hematuria: Secondary | ICD-10-CM | POA: Diagnosis not present

## 2024-04-27 DIAGNOSIS — D509 Iron deficiency anemia, unspecified: Secondary | ICD-10-CM | POA: Diagnosis not present

## 2024-04-27 DIAGNOSIS — N132 Hydronephrosis with renal and ureteral calculous obstruction: Secondary | ICD-10-CM | POA: Diagnosis not present

## 2024-04-27 DIAGNOSIS — E039 Hypothyroidism, unspecified: Secondary | ICD-10-CM | POA: Diagnosis not present

## 2024-04-27 DIAGNOSIS — R627 Adult failure to thrive: Secondary | ICD-10-CM | POA: Diagnosis not present

## 2024-04-27 DIAGNOSIS — E1122 Type 2 diabetes mellitus with diabetic chronic kidney disease: Secondary | ICD-10-CM | POA: Diagnosis not present

## 2024-04-27 DIAGNOSIS — N401 Enlarged prostate with lower urinary tract symptoms: Secondary | ICD-10-CM | POA: Diagnosis not present

## 2024-04-27 DIAGNOSIS — L89620 Pressure ulcer of left heel, unstageable: Secondary | ICD-10-CM | POA: Diagnosis not present

## 2024-04-27 DIAGNOSIS — Z556 Problems related to health literacy: Secondary | ICD-10-CM | POA: Diagnosis not present

## 2024-04-27 DIAGNOSIS — N183 Chronic kidney disease, stage 3 unspecified: Secondary | ICD-10-CM | POA: Diagnosis not present

## 2024-04-27 DIAGNOSIS — E871 Hypo-osmolality and hyponatremia: Secondary | ICD-10-CM | POA: Diagnosis not present

## 2024-04-28 DIAGNOSIS — I739 Peripheral vascular disease, unspecified: Secondary | ICD-10-CM | POA: Diagnosis not present

## 2024-04-28 DIAGNOSIS — L600 Ingrowing nail: Secondary | ICD-10-CM | POA: Diagnosis not present

## 2024-04-28 DIAGNOSIS — B351 Tinea unguium: Secondary | ICD-10-CM | POA: Diagnosis not present

## 2024-04-28 DIAGNOSIS — M79671 Pain in right foot: Secondary | ICD-10-CM | POA: Diagnosis not present

## 2024-04-28 DIAGNOSIS — L84 Corns and callosities: Secondary | ICD-10-CM | POA: Diagnosis not present

## 2024-04-29 DIAGNOSIS — H353231 Exudative age-related macular degeneration, bilateral, with active choroidal neovascularization: Secondary | ICD-10-CM | POA: Diagnosis not present

## 2024-04-30 DIAGNOSIS — L89620 Pressure ulcer of left heel, unstageable: Secondary | ICD-10-CM | POA: Diagnosis not present

## 2024-04-30 DIAGNOSIS — E1122 Type 2 diabetes mellitus with diabetic chronic kidney disease: Secondary | ICD-10-CM | POA: Diagnosis not present

## 2024-04-30 DIAGNOSIS — H409 Unspecified glaucoma: Secondary | ICD-10-CM | POA: Diagnosis not present

## 2024-04-30 DIAGNOSIS — D631 Anemia in chronic kidney disease: Secondary | ICD-10-CM | POA: Diagnosis not present

## 2024-04-30 DIAGNOSIS — N1832 Chronic kidney disease, stage 3b: Secondary | ICD-10-CM | POA: Diagnosis not present

## 2024-04-30 DIAGNOSIS — R627 Adult failure to thrive: Secondary | ICD-10-CM | POA: Diagnosis not present

## 2024-04-30 DIAGNOSIS — E039 Hypothyroidism, unspecified: Secondary | ICD-10-CM | POA: Diagnosis not present

## 2024-04-30 DIAGNOSIS — Z9181 History of falling: Secondary | ICD-10-CM | POA: Diagnosis not present

## 2024-04-30 DIAGNOSIS — N179 Acute kidney failure, unspecified: Secondary | ICD-10-CM | POA: Diagnosis not present

## 2024-04-30 DIAGNOSIS — L89890 Pressure ulcer of other site, unstageable: Secondary | ICD-10-CM | POA: Diagnosis not present

## 2024-04-30 DIAGNOSIS — N132 Hydronephrosis with renal and ureteral calculous obstruction: Secondary | ICD-10-CM | POA: Diagnosis not present

## 2024-04-30 DIAGNOSIS — R338 Other retention of urine: Secondary | ICD-10-CM | POA: Diagnosis not present

## 2024-04-30 DIAGNOSIS — M6282 Rhabdomyolysis: Secondary | ICD-10-CM | POA: Diagnosis not present

## 2024-04-30 DIAGNOSIS — N3080 Other cystitis without hematuria: Secondary | ICD-10-CM | POA: Diagnosis not present

## 2024-04-30 DIAGNOSIS — N401 Enlarged prostate with lower urinary tract symptoms: Secondary | ICD-10-CM | POA: Diagnosis not present

## 2024-04-30 DIAGNOSIS — I129 Hypertensive chronic kidney disease with stage 1 through stage 4 chronic kidney disease, or unspecified chronic kidney disease: Secondary | ICD-10-CM | POA: Diagnosis not present

## 2024-04-30 DIAGNOSIS — Z960 Presence of urogenital implants: Secondary | ICD-10-CM | POA: Diagnosis not present

## 2024-04-30 DIAGNOSIS — E291 Testicular hypofunction: Secondary | ICD-10-CM | POA: Diagnosis not present

## 2024-04-30 DIAGNOSIS — Z556 Problems related to health literacy: Secondary | ICD-10-CM | POA: Diagnosis not present

## 2024-05-04 DIAGNOSIS — Z960 Presence of urogenital implants: Secondary | ICD-10-CM | POA: Diagnosis not present

## 2024-05-04 DIAGNOSIS — Z9181 History of falling: Secondary | ICD-10-CM | POA: Diagnosis not present

## 2024-05-04 DIAGNOSIS — N179 Acute kidney failure, unspecified: Secondary | ICD-10-CM | POA: Diagnosis not present

## 2024-05-04 DIAGNOSIS — E039 Hypothyroidism, unspecified: Secondary | ICD-10-CM | POA: Diagnosis not present

## 2024-05-04 DIAGNOSIS — M6282 Rhabdomyolysis: Secondary | ICD-10-CM | POA: Diagnosis not present

## 2024-05-04 DIAGNOSIS — E1122 Type 2 diabetes mellitus with diabetic chronic kidney disease: Secondary | ICD-10-CM | POA: Diagnosis not present

## 2024-05-04 DIAGNOSIS — R627 Adult failure to thrive: Secondary | ICD-10-CM | POA: Diagnosis not present

## 2024-05-04 DIAGNOSIS — D631 Anemia in chronic kidney disease: Secondary | ICD-10-CM | POA: Diagnosis not present

## 2024-05-04 DIAGNOSIS — L89620 Pressure ulcer of left heel, unstageable: Secondary | ICD-10-CM | POA: Diagnosis not present

## 2024-05-04 DIAGNOSIS — N401 Enlarged prostate with lower urinary tract symptoms: Secondary | ICD-10-CM | POA: Diagnosis not present

## 2024-05-04 DIAGNOSIS — L89890 Pressure ulcer of other site, unstageable: Secondary | ICD-10-CM | POA: Diagnosis not present

## 2024-05-04 DIAGNOSIS — I129 Hypertensive chronic kidney disease with stage 1 through stage 4 chronic kidney disease, or unspecified chronic kidney disease: Secondary | ICD-10-CM | POA: Diagnosis not present

## 2024-05-04 DIAGNOSIS — H409 Unspecified glaucoma: Secondary | ICD-10-CM | POA: Diagnosis not present

## 2024-05-04 DIAGNOSIS — R338 Other retention of urine: Secondary | ICD-10-CM | POA: Diagnosis not present

## 2024-05-04 DIAGNOSIS — N3080 Other cystitis without hematuria: Secondary | ICD-10-CM | POA: Diagnosis not present

## 2024-05-04 DIAGNOSIS — E291 Testicular hypofunction: Secondary | ICD-10-CM | POA: Diagnosis not present

## 2024-05-04 DIAGNOSIS — Z556 Problems related to health literacy: Secondary | ICD-10-CM | POA: Diagnosis not present

## 2024-05-04 DIAGNOSIS — N1832 Chronic kidney disease, stage 3b: Secondary | ICD-10-CM | POA: Diagnosis not present

## 2024-05-04 DIAGNOSIS — N132 Hydronephrosis with renal and ureteral calculous obstruction: Secondary | ICD-10-CM | POA: Diagnosis not present

## 2024-05-10 DIAGNOSIS — E1122 Type 2 diabetes mellitus with diabetic chronic kidney disease: Secondary | ICD-10-CM | POA: Diagnosis not present

## 2024-05-10 DIAGNOSIS — N1832 Chronic kidney disease, stage 3b: Secondary | ICD-10-CM | POA: Diagnosis not present

## 2024-05-10 DIAGNOSIS — E782 Mixed hyperlipidemia: Secondary | ICD-10-CM | POA: Diagnosis not present

## 2024-05-10 DIAGNOSIS — I129 Hypertensive chronic kidney disease with stage 1 through stage 4 chronic kidney disease, or unspecified chronic kidney disease: Secondary | ICD-10-CM | POA: Diagnosis not present

## 2024-05-11 DIAGNOSIS — N401 Enlarged prostate with lower urinary tract symptoms: Secondary | ICD-10-CM | POA: Diagnosis not present

## 2024-05-11 DIAGNOSIS — N1832 Chronic kidney disease, stage 3b: Secondary | ICD-10-CM | POA: Diagnosis not present

## 2024-05-11 DIAGNOSIS — H409 Unspecified glaucoma: Secondary | ICD-10-CM | POA: Diagnosis not present

## 2024-05-11 DIAGNOSIS — M6282 Rhabdomyolysis: Secondary | ICD-10-CM | POA: Diagnosis not present

## 2024-05-11 DIAGNOSIS — N3080 Other cystitis without hematuria: Secondary | ICD-10-CM | POA: Diagnosis not present

## 2024-05-11 DIAGNOSIS — D631 Anemia in chronic kidney disease: Secondary | ICD-10-CM | POA: Diagnosis not present

## 2024-05-11 DIAGNOSIS — N179 Acute kidney failure, unspecified: Secondary | ICD-10-CM | POA: Diagnosis not present

## 2024-05-11 DIAGNOSIS — R338 Other retention of urine: Secondary | ICD-10-CM | POA: Diagnosis not present

## 2024-05-11 DIAGNOSIS — L89620 Pressure ulcer of left heel, unstageable: Secondary | ICD-10-CM | POA: Diagnosis not present

## 2024-05-11 DIAGNOSIS — E1122 Type 2 diabetes mellitus with diabetic chronic kidney disease: Secondary | ICD-10-CM | POA: Diagnosis not present

## 2024-05-11 DIAGNOSIS — Z9181 History of falling: Secondary | ICD-10-CM | POA: Diagnosis not present

## 2024-05-11 DIAGNOSIS — N132 Hydronephrosis with renal and ureteral calculous obstruction: Secondary | ICD-10-CM | POA: Diagnosis not present

## 2024-05-11 DIAGNOSIS — L89890 Pressure ulcer of other site, unstageable: Secondary | ICD-10-CM | POA: Diagnosis not present

## 2024-05-11 DIAGNOSIS — I129 Hypertensive chronic kidney disease with stage 1 through stage 4 chronic kidney disease, or unspecified chronic kidney disease: Secondary | ICD-10-CM | POA: Diagnosis not present

## 2024-05-11 DIAGNOSIS — Z556 Problems related to health literacy: Secondary | ICD-10-CM | POA: Diagnosis not present

## 2024-05-11 DIAGNOSIS — Z960 Presence of urogenital implants: Secondary | ICD-10-CM | POA: Diagnosis not present

## 2024-05-11 DIAGNOSIS — R627 Adult failure to thrive: Secondary | ICD-10-CM | POA: Diagnosis not present

## 2024-05-11 DIAGNOSIS — E039 Hypothyroidism, unspecified: Secondary | ICD-10-CM | POA: Diagnosis not present

## 2024-05-11 DIAGNOSIS — E291 Testicular hypofunction: Secondary | ICD-10-CM | POA: Diagnosis not present

## 2024-05-12 DIAGNOSIS — L89620 Pressure ulcer of left heel, unstageable: Secondary | ICD-10-CM | POA: Diagnosis not present

## 2024-05-12 DIAGNOSIS — I129 Hypertensive chronic kidney disease with stage 1 through stage 4 chronic kidney disease, or unspecified chronic kidney disease: Secondary | ICD-10-CM | POA: Diagnosis not present

## 2024-05-12 DIAGNOSIS — L89890 Pressure ulcer of other site, unstageable: Secondary | ICD-10-CM | POA: Diagnosis not present

## 2024-05-13 DIAGNOSIS — Z556 Problems related to health literacy: Secondary | ICD-10-CM | POA: Diagnosis not present

## 2024-05-13 DIAGNOSIS — E039 Hypothyroidism, unspecified: Secondary | ICD-10-CM | POA: Diagnosis not present

## 2024-05-13 DIAGNOSIS — Z9181 History of falling: Secondary | ICD-10-CM | POA: Diagnosis not present

## 2024-05-13 DIAGNOSIS — D631 Anemia in chronic kidney disease: Secondary | ICD-10-CM | POA: Diagnosis not present

## 2024-05-13 DIAGNOSIS — L89890 Pressure ulcer of other site, unstageable: Secondary | ICD-10-CM | POA: Diagnosis not present

## 2024-05-13 DIAGNOSIS — I129 Hypertensive chronic kidney disease with stage 1 through stage 4 chronic kidney disease, or unspecified chronic kidney disease: Secondary | ICD-10-CM | POA: Diagnosis not present

## 2024-05-13 DIAGNOSIS — N132 Hydronephrosis with renal and ureteral calculous obstruction: Secondary | ICD-10-CM | POA: Diagnosis not present

## 2024-05-13 DIAGNOSIS — N1832 Chronic kidney disease, stage 3b: Secondary | ICD-10-CM | POA: Diagnosis not present

## 2024-05-13 DIAGNOSIS — L89620 Pressure ulcer of left heel, unstageable: Secondary | ICD-10-CM | POA: Diagnosis not present

## 2024-05-13 DIAGNOSIS — N3080 Other cystitis without hematuria: Secondary | ICD-10-CM | POA: Diagnosis not present

## 2024-05-13 DIAGNOSIS — N401 Enlarged prostate with lower urinary tract symptoms: Secondary | ICD-10-CM | POA: Diagnosis not present

## 2024-05-13 DIAGNOSIS — N179 Acute kidney failure, unspecified: Secondary | ICD-10-CM | POA: Diagnosis not present

## 2024-05-13 DIAGNOSIS — R338 Other retention of urine: Secondary | ICD-10-CM | POA: Diagnosis not present

## 2024-05-13 DIAGNOSIS — E1122 Type 2 diabetes mellitus with diabetic chronic kidney disease: Secondary | ICD-10-CM | POA: Diagnosis not present

## 2024-05-13 DIAGNOSIS — Z960 Presence of urogenital implants: Secondary | ICD-10-CM | POA: Diagnosis not present

## 2024-05-13 DIAGNOSIS — M6282 Rhabdomyolysis: Secondary | ICD-10-CM | POA: Diagnosis not present

## 2024-05-13 DIAGNOSIS — R627 Adult failure to thrive: Secondary | ICD-10-CM | POA: Diagnosis not present

## 2024-05-13 DIAGNOSIS — E291 Testicular hypofunction: Secondary | ICD-10-CM | POA: Diagnosis not present

## 2024-05-13 DIAGNOSIS — H409 Unspecified glaucoma: Secondary | ICD-10-CM | POA: Diagnosis not present

## 2024-05-17 DIAGNOSIS — E291 Testicular hypofunction: Secondary | ICD-10-CM | POA: Diagnosis not present

## 2024-05-17 DIAGNOSIS — E039 Hypothyroidism, unspecified: Secondary | ICD-10-CM | POA: Diagnosis not present

## 2024-05-17 DIAGNOSIS — M6282 Rhabdomyolysis: Secondary | ICD-10-CM | POA: Diagnosis not present

## 2024-05-17 DIAGNOSIS — L89620 Pressure ulcer of left heel, unstageable: Secondary | ICD-10-CM | POA: Diagnosis not present

## 2024-05-17 DIAGNOSIS — I129 Hypertensive chronic kidney disease with stage 1 through stage 4 chronic kidney disease, or unspecified chronic kidney disease: Secondary | ICD-10-CM | POA: Diagnosis not present

## 2024-05-17 DIAGNOSIS — Z9181 History of falling: Secondary | ICD-10-CM | POA: Diagnosis not present

## 2024-05-17 DIAGNOSIS — N132 Hydronephrosis with renal and ureteral calculous obstruction: Secondary | ICD-10-CM | POA: Diagnosis not present

## 2024-05-17 DIAGNOSIS — E1122 Type 2 diabetes mellitus with diabetic chronic kidney disease: Secondary | ICD-10-CM | POA: Diagnosis not present

## 2024-05-17 DIAGNOSIS — N3080 Other cystitis without hematuria: Secondary | ICD-10-CM | POA: Diagnosis not present

## 2024-05-17 DIAGNOSIS — Z556 Problems related to health literacy: Secondary | ICD-10-CM | POA: Diagnosis not present

## 2024-05-17 DIAGNOSIS — L89890 Pressure ulcer of other site, unstageable: Secondary | ICD-10-CM | POA: Diagnosis not present

## 2024-05-17 DIAGNOSIS — H409 Unspecified glaucoma: Secondary | ICD-10-CM | POA: Diagnosis not present

## 2024-05-17 DIAGNOSIS — Z960 Presence of urogenital implants: Secondary | ICD-10-CM | POA: Diagnosis not present

## 2024-05-17 DIAGNOSIS — N179 Acute kidney failure, unspecified: Secondary | ICD-10-CM | POA: Diagnosis not present

## 2024-05-17 DIAGNOSIS — R627 Adult failure to thrive: Secondary | ICD-10-CM | POA: Diagnosis not present

## 2024-05-17 DIAGNOSIS — N1832 Chronic kidney disease, stage 3b: Secondary | ICD-10-CM | POA: Diagnosis not present

## 2024-05-17 DIAGNOSIS — R338 Other retention of urine: Secondary | ICD-10-CM | POA: Diagnosis not present

## 2024-05-17 DIAGNOSIS — D631 Anemia in chronic kidney disease: Secondary | ICD-10-CM | POA: Diagnosis not present

## 2024-05-17 DIAGNOSIS — N401 Enlarged prostate with lower urinary tract symptoms: Secondary | ICD-10-CM | POA: Diagnosis not present

## 2024-05-18 DIAGNOSIS — N132 Hydronephrosis with renal and ureteral calculous obstruction: Secondary | ICD-10-CM | POA: Diagnosis not present

## 2024-05-18 DIAGNOSIS — Z7984 Long term (current) use of oral hypoglycemic drugs: Secondary | ICD-10-CM | POA: Diagnosis not present

## 2024-05-18 DIAGNOSIS — Z9181 History of falling: Secondary | ICD-10-CM | POA: Diagnosis not present

## 2024-05-18 DIAGNOSIS — L89620 Pressure ulcer of left heel, unstageable: Secondary | ICD-10-CM | POA: Diagnosis not present

## 2024-05-18 DIAGNOSIS — N3080 Other cystitis without hematuria: Secondary | ICD-10-CM | POA: Diagnosis not present

## 2024-05-18 DIAGNOSIS — N1832 Chronic kidney disease, stage 3b: Secondary | ICD-10-CM | POA: Diagnosis not present

## 2024-05-18 DIAGNOSIS — L89890 Pressure ulcer of other site, unstageable: Secondary | ICD-10-CM | POA: Diagnosis not present

## 2024-05-18 DIAGNOSIS — E1122 Type 2 diabetes mellitus with diabetic chronic kidney disease: Secondary | ICD-10-CM | POA: Diagnosis not present

## 2024-05-18 DIAGNOSIS — E039 Hypothyroidism, unspecified: Secondary | ICD-10-CM | POA: Diagnosis not present

## 2024-05-18 DIAGNOSIS — Z556 Problems related to health literacy: Secondary | ICD-10-CM | POA: Diagnosis not present

## 2024-05-18 DIAGNOSIS — D631 Anemia in chronic kidney disease: Secondary | ICD-10-CM | POA: Diagnosis not present

## 2024-05-18 DIAGNOSIS — H409 Unspecified glaucoma: Secondary | ICD-10-CM | POA: Diagnosis not present

## 2024-05-18 DIAGNOSIS — Z794 Long term (current) use of insulin: Secondary | ICD-10-CM | POA: Diagnosis not present

## 2024-05-18 DIAGNOSIS — R627 Adult failure to thrive: Secondary | ICD-10-CM | POA: Diagnosis not present

## 2024-05-18 DIAGNOSIS — Z960 Presence of urogenital implants: Secondary | ICD-10-CM | POA: Diagnosis not present

## 2024-05-18 DIAGNOSIS — I129 Hypertensive chronic kidney disease with stage 1 through stage 4 chronic kidney disease, or unspecified chronic kidney disease: Secondary | ICD-10-CM | POA: Diagnosis not present

## 2024-05-18 DIAGNOSIS — R338 Other retention of urine: Secondary | ICD-10-CM | POA: Diagnosis not present

## 2024-05-18 DIAGNOSIS — N179 Acute kidney failure, unspecified: Secondary | ICD-10-CM | POA: Diagnosis not present

## 2024-05-18 DIAGNOSIS — M6282 Rhabdomyolysis: Secondary | ICD-10-CM | POA: Diagnosis not present

## 2024-05-18 DIAGNOSIS — E291 Testicular hypofunction: Secondary | ICD-10-CM | POA: Diagnosis not present

## 2024-05-18 DIAGNOSIS — N401 Enlarged prostate with lower urinary tract symptoms: Secondary | ICD-10-CM | POA: Diagnosis not present

## 2024-05-20 DIAGNOSIS — N189 Chronic kidney disease, unspecified: Secondary | ICD-10-CM | POA: Diagnosis not present

## 2024-05-20 DIAGNOSIS — R3914 Feeling of incomplete bladder emptying: Secondary | ICD-10-CM | POA: Diagnosis not present

## 2024-05-20 DIAGNOSIS — N401 Enlarged prostate with lower urinary tract symptoms: Secondary | ICD-10-CM | POA: Diagnosis not present

## 2024-05-21 DIAGNOSIS — E785 Hyperlipidemia, unspecified: Secondary | ICD-10-CM | POA: Diagnosis not present

## 2024-05-21 DIAGNOSIS — D649 Anemia, unspecified: Secondary | ICD-10-CM | POA: Diagnosis not present

## 2024-05-25 DIAGNOSIS — R627 Adult failure to thrive: Secondary | ICD-10-CM | POA: Diagnosis not present

## 2024-05-25 DIAGNOSIS — E039 Hypothyroidism, unspecified: Secondary | ICD-10-CM | POA: Diagnosis not present

## 2024-05-25 DIAGNOSIS — H409 Unspecified glaucoma: Secondary | ICD-10-CM | POA: Diagnosis not present

## 2024-05-25 DIAGNOSIS — L89890 Pressure ulcer of other site, unstageable: Secondary | ICD-10-CM | POA: Diagnosis not present

## 2024-05-25 DIAGNOSIS — N3080 Other cystitis without hematuria: Secondary | ICD-10-CM | POA: Diagnosis not present

## 2024-05-25 DIAGNOSIS — N1832 Chronic kidney disease, stage 3b: Secondary | ICD-10-CM | POA: Diagnosis not present

## 2024-05-25 DIAGNOSIS — Z556 Problems related to health literacy: Secondary | ICD-10-CM | POA: Diagnosis not present

## 2024-05-25 DIAGNOSIS — M6282 Rhabdomyolysis: Secondary | ICD-10-CM | POA: Diagnosis not present

## 2024-05-25 DIAGNOSIS — Z9181 History of falling: Secondary | ICD-10-CM | POA: Diagnosis not present

## 2024-05-25 DIAGNOSIS — L89620 Pressure ulcer of left heel, unstageable: Secondary | ICD-10-CM | POA: Diagnosis not present

## 2024-05-25 DIAGNOSIS — E1122 Type 2 diabetes mellitus with diabetic chronic kidney disease: Secondary | ICD-10-CM | POA: Diagnosis not present

## 2024-05-25 DIAGNOSIS — D631 Anemia in chronic kidney disease: Secondary | ICD-10-CM | POA: Diagnosis not present

## 2024-05-25 DIAGNOSIS — N179 Acute kidney failure, unspecified: Secondary | ICD-10-CM | POA: Diagnosis not present

## 2024-05-25 DIAGNOSIS — N401 Enlarged prostate with lower urinary tract symptoms: Secondary | ICD-10-CM | POA: Diagnosis not present

## 2024-05-25 DIAGNOSIS — Z960 Presence of urogenital implants: Secondary | ICD-10-CM | POA: Diagnosis not present

## 2024-05-25 DIAGNOSIS — E291 Testicular hypofunction: Secondary | ICD-10-CM | POA: Diagnosis not present

## 2024-05-25 DIAGNOSIS — R338 Other retention of urine: Secondary | ICD-10-CM | POA: Diagnosis not present

## 2024-05-25 DIAGNOSIS — I129 Hypertensive chronic kidney disease with stage 1 through stage 4 chronic kidney disease, or unspecified chronic kidney disease: Secondary | ICD-10-CM | POA: Diagnosis not present

## 2024-05-25 DIAGNOSIS — N132 Hydronephrosis with renal and ureteral calculous obstruction: Secondary | ICD-10-CM | POA: Diagnosis not present

## 2024-05-26 DIAGNOSIS — R627 Adult failure to thrive: Secondary | ICD-10-CM | POA: Diagnosis not present

## 2024-05-26 DIAGNOSIS — E291 Testicular hypofunction: Secondary | ICD-10-CM | POA: Diagnosis not present

## 2024-05-26 DIAGNOSIS — H409 Unspecified glaucoma: Secondary | ICD-10-CM | POA: Diagnosis not present

## 2024-05-26 DIAGNOSIS — N1832 Chronic kidney disease, stage 3b: Secondary | ICD-10-CM | POA: Diagnosis not present

## 2024-05-26 DIAGNOSIS — Z556 Problems related to health literacy: Secondary | ICD-10-CM | POA: Diagnosis not present

## 2024-05-26 DIAGNOSIS — I129 Hypertensive chronic kidney disease with stage 1 through stage 4 chronic kidney disease, or unspecified chronic kidney disease: Secondary | ICD-10-CM | POA: Diagnosis not present

## 2024-05-26 DIAGNOSIS — N132 Hydronephrosis with renal and ureteral calculous obstruction: Secondary | ICD-10-CM | POA: Diagnosis not present

## 2024-05-26 DIAGNOSIS — Z9181 History of falling: Secondary | ICD-10-CM | POA: Diagnosis not present

## 2024-05-26 DIAGNOSIS — E1122 Type 2 diabetes mellitus with diabetic chronic kidney disease: Secondary | ICD-10-CM | POA: Diagnosis not present

## 2024-05-26 DIAGNOSIS — N3080 Other cystitis without hematuria: Secondary | ICD-10-CM | POA: Diagnosis not present

## 2024-05-26 DIAGNOSIS — L89620 Pressure ulcer of left heel, unstageable: Secondary | ICD-10-CM | POA: Diagnosis not present

## 2024-05-26 DIAGNOSIS — R338 Other retention of urine: Secondary | ICD-10-CM | POA: Diagnosis not present

## 2024-05-26 DIAGNOSIS — E039 Hypothyroidism, unspecified: Secondary | ICD-10-CM | POA: Diagnosis not present

## 2024-05-26 DIAGNOSIS — N401 Enlarged prostate with lower urinary tract symptoms: Secondary | ICD-10-CM | POA: Diagnosis not present

## 2024-05-26 DIAGNOSIS — M6282 Rhabdomyolysis: Secondary | ICD-10-CM | POA: Diagnosis not present

## 2024-05-26 DIAGNOSIS — Z960 Presence of urogenital implants: Secondary | ICD-10-CM | POA: Diagnosis not present

## 2024-05-26 DIAGNOSIS — N179 Acute kidney failure, unspecified: Secondary | ICD-10-CM | POA: Diagnosis not present

## 2024-05-26 DIAGNOSIS — D631 Anemia in chronic kidney disease: Secondary | ICD-10-CM | POA: Diagnosis not present

## 2024-05-26 DIAGNOSIS — L89890 Pressure ulcer of other site, unstageable: Secondary | ICD-10-CM | POA: Diagnosis not present

## 2024-05-31 DIAGNOSIS — I129 Hypertensive chronic kidney disease with stage 1 through stage 4 chronic kidney disease, or unspecified chronic kidney disease: Secondary | ICD-10-CM | POA: Diagnosis not present

## 2024-05-31 DIAGNOSIS — N179 Acute kidney failure, unspecified: Secondary | ICD-10-CM | POA: Diagnosis not present

## 2024-05-31 DIAGNOSIS — R338 Other retention of urine: Secondary | ICD-10-CM | POA: Diagnosis not present

## 2024-05-31 DIAGNOSIS — Z556 Problems related to health literacy: Secondary | ICD-10-CM | POA: Diagnosis not present

## 2024-05-31 DIAGNOSIS — N132 Hydronephrosis with renal and ureteral calculous obstruction: Secondary | ICD-10-CM | POA: Diagnosis not present

## 2024-05-31 DIAGNOSIS — R627 Adult failure to thrive: Secondary | ICD-10-CM | POA: Diagnosis not present

## 2024-05-31 DIAGNOSIS — D631 Anemia in chronic kidney disease: Secondary | ICD-10-CM | POA: Diagnosis not present

## 2024-05-31 DIAGNOSIS — Z9181 History of falling: Secondary | ICD-10-CM | POA: Diagnosis not present

## 2024-05-31 DIAGNOSIS — N3080 Other cystitis without hematuria: Secondary | ICD-10-CM | POA: Diagnosis not present

## 2024-05-31 DIAGNOSIS — N1832 Chronic kidney disease, stage 3b: Secondary | ICD-10-CM | POA: Diagnosis not present

## 2024-05-31 DIAGNOSIS — E1122 Type 2 diabetes mellitus with diabetic chronic kidney disease: Secondary | ICD-10-CM | POA: Diagnosis not present

## 2024-05-31 DIAGNOSIS — E039 Hypothyroidism, unspecified: Secondary | ICD-10-CM | POA: Diagnosis not present

## 2024-05-31 DIAGNOSIS — N401 Enlarged prostate with lower urinary tract symptoms: Secondary | ICD-10-CM | POA: Diagnosis not present

## 2024-05-31 DIAGNOSIS — M6282 Rhabdomyolysis: Secondary | ICD-10-CM | POA: Diagnosis not present

## 2024-05-31 DIAGNOSIS — L89620 Pressure ulcer of left heel, unstageable: Secondary | ICD-10-CM | POA: Diagnosis not present

## 2024-05-31 DIAGNOSIS — E291 Testicular hypofunction: Secondary | ICD-10-CM | POA: Diagnosis not present

## 2024-05-31 DIAGNOSIS — L89890 Pressure ulcer of other site, unstageable: Secondary | ICD-10-CM | POA: Diagnosis not present

## 2024-05-31 DIAGNOSIS — H409 Unspecified glaucoma: Secondary | ICD-10-CM | POA: Diagnosis not present

## 2024-05-31 DIAGNOSIS — Z960 Presence of urogenital implants: Secondary | ICD-10-CM | POA: Diagnosis not present

## 2024-06-03 DIAGNOSIS — Z556 Problems related to health literacy: Secondary | ICD-10-CM | POA: Diagnosis not present

## 2024-06-03 DIAGNOSIS — L89890 Pressure ulcer of other site, unstageable: Secondary | ICD-10-CM | POA: Diagnosis not present

## 2024-06-03 DIAGNOSIS — N132 Hydronephrosis with renal and ureteral calculous obstruction: Secondary | ICD-10-CM | POA: Diagnosis not present

## 2024-06-03 DIAGNOSIS — H43393 Other vitreous opacities, bilateral: Secondary | ICD-10-CM | POA: Diagnosis not present

## 2024-06-03 DIAGNOSIS — M6282 Rhabdomyolysis: Secondary | ICD-10-CM | POA: Diagnosis not present

## 2024-06-03 DIAGNOSIS — E1122 Type 2 diabetes mellitus with diabetic chronic kidney disease: Secondary | ICD-10-CM | POA: Diagnosis not present

## 2024-06-03 DIAGNOSIS — R627 Adult failure to thrive: Secondary | ICD-10-CM | POA: Diagnosis not present

## 2024-06-03 DIAGNOSIS — Z9181 History of falling: Secondary | ICD-10-CM | POA: Diagnosis not present

## 2024-06-03 DIAGNOSIS — N1832 Chronic kidney disease, stage 3b: Secondary | ICD-10-CM | POA: Diagnosis not present

## 2024-06-03 DIAGNOSIS — H43813 Vitreous degeneration, bilateral: Secondary | ICD-10-CM | POA: Diagnosis not present

## 2024-06-03 DIAGNOSIS — E113293 Type 2 diabetes mellitus with mild nonproliferative diabetic retinopathy without macular edema, bilateral: Secondary | ICD-10-CM | POA: Diagnosis not present

## 2024-06-03 DIAGNOSIS — E291 Testicular hypofunction: Secondary | ICD-10-CM | POA: Diagnosis not present

## 2024-06-03 DIAGNOSIS — N401 Enlarged prostate with lower urinary tract symptoms: Secondary | ICD-10-CM | POA: Diagnosis not present

## 2024-06-03 DIAGNOSIS — N179 Acute kidney failure, unspecified: Secondary | ICD-10-CM | POA: Diagnosis not present

## 2024-06-03 DIAGNOSIS — E039 Hypothyroidism, unspecified: Secondary | ICD-10-CM | POA: Diagnosis not present

## 2024-06-03 DIAGNOSIS — H353123 Nonexudative age-related macular degeneration, left eye, advanced atrophic without subfoveal involvement: Secondary | ICD-10-CM | POA: Diagnosis not present

## 2024-06-03 DIAGNOSIS — D631 Anemia in chronic kidney disease: Secondary | ICD-10-CM | POA: Diagnosis not present

## 2024-06-03 DIAGNOSIS — L89620 Pressure ulcer of left heel, unstageable: Secondary | ICD-10-CM | POA: Diagnosis not present

## 2024-06-03 DIAGNOSIS — I129 Hypertensive chronic kidney disease with stage 1 through stage 4 chronic kidney disease, or unspecified chronic kidney disease: Secondary | ICD-10-CM | POA: Diagnosis not present

## 2024-06-03 DIAGNOSIS — H409 Unspecified glaucoma: Secondary | ICD-10-CM | POA: Diagnosis not present

## 2024-06-03 DIAGNOSIS — H353221 Exudative age-related macular degeneration, left eye, with active choroidal neovascularization: Secondary | ICD-10-CM | POA: Diagnosis not present

## 2024-06-03 DIAGNOSIS — R338 Other retention of urine: Secondary | ICD-10-CM | POA: Diagnosis not present

## 2024-06-03 DIAGNOSIS — Z960 Presence of urogenital implants: Secondary | ICD-10-CM | POA: Diagnosis not present

## 2024-06-03 DIAGNOSIS — N3080 Other cystitis without hematuria: Secondary | ICD-10-CM | POA: Diagnosis not present

## 2024-06-07 DIAGNOSIS — L89890 Pressure ulcer of other site, unstageable: Secondary | ICD-10-CM | POA: Diagnosis not present

## 2024-06-07 DIAGNOSIS — E039 Hypothyroidism, unspecified: Secondary | ICD-10-CM | POA: Diagnosis not present

## 2024-06-07 DIAGNOSIS — M6282 Rhabdomyolysis: Secondary | ICD-10-CM | POA: Diagnosis not present

## 2024-06-07 DIAGNOSIS — R338 Other retention of urine: Secondary | ICD-10-CM | POA: Diagnosis not present

## 2024-06-07 DIAGNOSIS — N132 Hydronephrosis with renal and ureteral calculous obstruction: Secondary | ICD-10-CM | POA: Diagnosis not present

## 2024-06-07 DIAGNOSIS — L89620 Pressure ulcer of left heel, unstageable: Secondary | ICD-10-CM | POA: Diagnosis not present

## 2024-06-07 DIAGNOSIS — N401 Enlarged prostate with lower urinary tract symptoms: Secondary | ICD-10-CM | POA: Diagnosis not present

## 2024-06-07 DIAGNOSIS — H409 Unspecified glaucoma: Secondary | ICD-10-CM | POA: Diagnosis not present

## 2024-06-07 DIAGNOSIS — Z960 Presence of urogenital implants: Secondary | ICD-10-CM | POA: Diagnosis not present

## 2024-06-07 DIAGNOSIS — N3080 Other cystitis without hematuria: Secondary | ICD-10-CM | POA: Diagnosis not present

## 2024-06-07 DIAGNOSIS — I129 Hypertensive chronic kidney disease with stage 1 through stage 4 chronic kidney disease, or unspecified chronic kidney disease: Secondary | ICD-10-CM | POA: Diagnosis not present

## 2024-06-07 DIAGNOSIS — E291 Testicular hypofunction: Secondary | ICD-10-CM | POA: Diagnosis not present

## 2024-06-07 DIAGNOSIS — N179 Acute kidney failure, unspecified: Secondary | ICD-10-CM | POA: Diagnosis not present

## 2024-06-07 DIAGNOSIS — D631 Anemia in chronic kidney disease: Secondary | ICD-10-CM | POA: Diagnosis not present

## 2024-06-07 DIAGNOSIS — Z556 Problems related to health literacy: Secondary | ICD-10-CM | POA: Diagnosis not present

## 2024-06-07 DIAGNOSIS — N1832 Chronic kidney disease, stage 3b: Secondary | ICD-10-CM | POA: Diagnosis not present

## 2024-06-07 DIAGNOSIS — E1122 Type 2 diabetes mellitus with diabetic chronic kidney disease: Secondary | ICD-10-CM | POA: Diagnosis not present

## 2024-06-07 DIAGNOSIS — Z9181 History of falling: Secondary | ICD-10-CM | POA: Diagnosis not present

## 2024-06-07 DIAGNOSIS — R627 Adult failure to thrive: Secondary | ICD-10-CM | POA: Diagnosis not present

## 2024-06-08 DIAGNOSIS — E1122 Type 2 diabetes mellitus with diabetic chronic kidney disease: Secondary | ICD-10-CM | POA: Diagnosis not present

## 2024-06-08 DIAGNOSIS — Z794 Long term (current) use of insulin: Secondary | ICD-10-CM | POA: Diagnosis not present

## 2024-06-08 DIAGNOSIS — N1832 Chronic kidney disease, stage 3b: Secondary | ICD-10-CM | POA: Diagnosis not present

## 2024-06-08 DIAGNOSIS — Z7984 Long term (current) use of oral hypoglycemic drugs: Secondary | ICD-10-CM | POA: Diagnosis not present

## 2024-06-11 DIAGNOSIS — N179 Acute kidney failure, unspecified: Secondary | ICD-10-CM | POA: Diagnosis not present

## 2024-06-11 DIAGNOSIS — L89890 Pressure ulcer of other site, unstageable: Secondary | ICD-10-CM | POA: Diagnosis not present

## 2024-06-11 DIAGNOSIS — E291 Testicular hypofunction: Secondary | ICD-10-CM | POA: Diagnosis not present

## 2024-06-11 DIAGNOSIS — H409 Unspecified glaucoma: Secondary | ICD-10-CM | POA: Diagnosis not present

## 2024-06-11 DIAGNOSIS — E1122 Type 2 diabetes mellitus with diabetic chronic kidney disease: Secondary | ICD-10-CM | POA: Diagnosis not present

## 2024-06-11 DIAGNOSIS — N132 Hydronephrosis with renal and ureteral calculous obstruction: Secondary | ICD-10-CM | POA: Diagnosis not present

## 2024-06-11 DIAGNOSIS — D631 Anemia in chronic kidney disease: Secondary | ICD-10-CM | POA: Diagnosis not present

## 2024-06-11 DIAGNOSIS — E039 Hypothyroidism, unspecified: Secondary | ICD-10-CM | POA: Diagnosis not present

## 2024-06-11 DIAGNOSIS — N1832 Chronic kidney disease, stage 3b: Secondary | ICD-10-CM | POA: Diagnosis not present

## 2024-06-11 DIAGNOSIS — M6282 Rhabdomyolysis: Secondary | ICD-10-CM | POA: Diagnosis not present

## 2024-06-11 DIAGNOSIS — R338 Other retention of urine: Secondary | ICD-10-CM | POA: Diagnosis not present

## 2024-06-11 DIAGNOSIS — L89620 Pressure ulcer of left heel, unstageable: Secondary | ICD-10-CM | POA: Diagnosis not present

## 2024-06-11 DIAGNOSIS — Z9181 History of falling: Secondary | ICD-10-CM | POA: Diagnosis not present

## 2024-06-11 DIAGNOSIS — Z556 Problems related to health literacy: Secondary | ICD-10-CM | POA: Diagnosis not present

## 2024-06-11 DIAGNOSIS — R627 Adult failure to thrive: Secondary | ICD-10-CM | POA: Diagnosis not present

## 2024-06-11 DIAGNOSIS — N401 Enlarged prostate with lower urinary tract symptoms: Secondary | ICD-10-CM | POA: Diagnosis not present

## 2024-06-11 DIAGNOSIS — I129 Hypertensive chronic kidney disease with stage 1 through stage 4 chronic kidney disease, or unspecified chronic kidney disease: Secondary | ICD-10-CM | POA: Diagnosis not present

## 2024-06-11 DIAGNOSIS — Z960 Presence of urogenital implants: Secondary | ICD-10-CM | POA: Diagnosis not present

## 2024-06-11 DIAGNOSIS — N3080 Other cystitis without hematuria: Secondary | ICD-10-CM | POA: Diagnosis not present

## 2024-06-12 DIAGNOSIS — E291 Testicular hypofunction: Secondary | ICD-10-CM | POA: Diagnosis not present

## 2024-06-12 DIAGNOSIS — Z794 Long term (current) use of insulin: Secondary | ICD-10-CM | POA: Diagnosis not present

## 2024-06-12 DIAGNOSIS — I129 Hypertensive chronic kidney disease with stage 1 through stage 4 chronic kidney disease, or unspecified chronic kidney disease: Secondary | ICD-10-CM | POA: Diagnosis not present

## 2024-06-12 DIAGNOSIS — Z79890 Hormone replacement therapy (postmenopausal): Secondary | ICD-10-CM | POA: Diagnosis not present

## 2024-06-12 DIAGNOSIS — R3981 Functional urinary incontinence: Secondary | ICD-10-CM | POA: Diagnosis not present

## 2024-06-12 DIAGNOSIS — M6282 Rhabdomyolysis: Secondary | ICD-10-CM | POA: Diagnosis not present

## 2024-06-12 DIAGNOSIS — R338 Other retention of urine: Secondary | ICD-10-CM | POA: Diagnosis not present

## 2024-06-12 DIAGNOSIS — Z960 Presence of urogenital implants: Secondary | ICD-10-CM | POA: Diagnosis not present

## 2024-06-12 DIAGNOSIS — L89890 Pressure ulcer of other site, unstageable: Secondary | ICD-10-CM | POA: Diagnosis not present

## 2024-06-12 DIAGNOSIS — E1122 Type 2 diabetes mellitus with diabetic chronic kidney disease: Secondary | ICD-10-CM | POA: Diagnosis not present

## 2024-06-12 DIAGNOSIS — N1832 Chronic kidney disease, stage 3b: Secondary | ICD-10-CM | POA: Diagnosis not present

## 2024-06-12 DIAGNOSIS — Z9181 History of falling: Secondary | ICD-10-CM | POA: Diagnosis not present

## 2024-06-12 DIAGNOSIS — H409 Unspecified glaucoma: Secondary | ICD-10-CM | POA: Diagnosis not present

## 2024-06-12 DIAGNOSIS — Z7984 Long term (current) use of oral hypoglycemic drugs: Secondary | ICD-10-CM | POA: Diagnosis not present

## 2024-06-12 DIAGNOSIS — Z7982 Long term (current) use of aspirin: Secondary | ICD-10-CM | POA: Diagnosis not present

## 2024-06-12 DIAGNOSIS — Z556 Problems related to health literacy: Secondary | ICD-10-CM | POA: Diagnosis not present

## 2024-06-12 DIAGNOSIS — E039 Hypothyroidism, unspecified: Secondary | ICD-10-CM | POA: Diagnosis not present

## 2024-06-12 DIAGNOSIS — R627 Adult failure to thrive: Secondary | ICD-10-CM | POA: Diagnosis not present

## 2024-06-12 DIAGNOSIS — L89620 Pressure ulcer of left heel, unstageable: Secondary | ICD-10-CM | POA: Diagnosis not present

## 2024-06-14 DIAGNOSIS — E1122 Type 2 diabetes mellitus with diabetic chronic kidney disease: Secondary | ICD-10-CM | POA: Diagnosis not present

## 2024-06-14 DIAGNOSIS — Z9181 History of falling: Secondary | ICD-10-CM | POA: Diagnosis not present

## 2024-06-14 DIAGNOSIS — E291 Testicular hypofunction: Secondary | ICD-10-CM | POA: Diagnosis not present

## 2024-06-14 DIAGNOSIS — Z7982 Long term (current) use of aspirin: Secondary | ICD-10-CM | POA: Diagnosis not present

## 2024-06-14 DIAGNOSIS — L89890 Pressure ulcer of other site, unstageable: Secondary | ICD-10-CM | POA: Diagnosis not present

## 2024-06-14 DIAGNOSIS — Z794 Long term (current) use of insulin: Secondary | ICD-10-CM | POA: Diagnosis not present

## 2024-06-14 DIAGNOSIS — R627 Adult failure to thrive: Secondary | ICD-10-CM | POA: Diagnosis not present

## 2024-06-14 DIAGNOSIS — Z7984 Long term (current) use of oral hypoglycemic drugs: Secondary | ICD-10-CM | POA: Diagnosis not present

## 2024-06-14 DIAGNOSIS — L89620 Pressure ulcer of left heel, unstageable: Secondary | ICD-10-CM | POA: Diagnosis not present

## 2024-06-14 DIAGNOSIS — E039 Hypothyroidism, unspecified: Secondary | ICD-10-CM | POA: Diagnosis not present

## 2024-06-14 DIAGNOSIS — I129 Hypertensive chronic kidney disease with stage 1 through stage 4 chronic kidney disease, or unspecified chronic kidney disease: Secondary | ICD-10-CM | POA: Diagnosis not present

## 2024-06-14 DIAGNOSIS — Z556 Problems related to health literacy: Secondary | ICD-10-CM | POA: Diagnosis not present

## 2024-06-14 DIAGNOSIS — N1832 Chronic kidney disease, stage 3b: Secondary | ICD-10-CM | POA: Diagnosis not present

## 2024-06-14 DIAGNOSIS — Z79890 Hormone replacement therapy (postmenopausal): Secondary | ICD-10-CM | POA: Diagnosis not present

## 2024-06-14 DIAGNOSIS — Z960 Presence of urogenital implants: Secondary | ICD-10-CM | POA: Diagnosis not present

## 2024-06-14 DIAGNOSIS — M6282 Rhabdomyolysis: Secondary | ICD-10-CM | POA: Diagnosis not present

## 2024-06-14 DIAGNOSIS — R338 Other retention of urine: Secondary | ICD-10-CM | POA: Diagnosis not present

## 2024-06-14 DIAGNOSIS — H409 Unspecified glaucoma: Secondary | ICD-10-CM | POA: Diagnosis not present

## 2024-06-15 ENCOUNTER — Encounter (HOSPITAL_BASED_OUTPATIENT_CLINIC_OR_DEPARTMENT_OTHER): Attending: Internal Medicine | Admitting: Internal Medicine

## 2024-06-15 DIAGNOSIS — E1122 Type 2 diabetes mellitus with diabetic chronic kidney disease: Secondary | ICD-10-CM | POA: Insufficient documentation

## 2024-06-15 DIAGNOSIS — E11621 Type 2 diabetes mellitus with foot ulcer: Secondary | ICD-10-CM | POA: Diagnosis not present

## 2024-06-15 DIAGNOSIS — N183 Chronic kidney disease, stage 3 unspecified: Secondary | ICD-10-CM | POA: Insufficient documentation

## 2024-06-15 DIAGNOSIS — Z794 Long term (current) use of insulin: Secondary | ICD-10-CM | POA: Diagnosis not present

## 2024-06-15 DIAGNOSIS — L89629 Pressure ulcer of left heel, unspecified stage: Secondary | ICD-10-CM | POA: Diagnosis not present

## 2024-06-16 DIAGNOSIS — Z7982 Long term (current) use of aspirin: Secondary | ICD-10-CM | POA: Diagnosis not present

## 2024-06-16 DIAGNOSIS — Z794 Long term (current) use of insulin: Secondary | ICD-10-CM | POA: Diagnosis not present

## 2024-06-16 DIAGNOSIS — L89620 Pressure ulcer of left heel, unstageable: Secondary | ICD-10-CM | POA: Diagnosis not present

## 2024-06-16 DIAGNOSIS — N1832 Chronic kidney disease, stage 3b: Secondary | ICD-10-CM | POA: Diagnosis not present

## 2024-06-16 DIAGNOSIS — Z7984 Long term (current) use of oral hypoglycemic drugs: Secondary | ICD-10-CM | POA: Diagnosis not present

## 2024-06-16 DIAGNOSIS — I129 Hypertensive chronic kidney disease with stage 1 through stage 4 chronic kidney disease, or unspecified chronic kidney disease: Secondary | ICD-10-CM | POA: Diagnosis not present

## 2024-06-16 DIAGNOSIS — M6282 Rhabdomyolysis: Secondary | ICD-10-CM | POA: Diagnosis not present

## 2024-06-16 DIAGNOSIS — E291 Testicular hypofunction: Secondary | ICD-10-CM | POA: Diagnosis not present

## 2024-06-16 DIAGNOSIS — H409 Unspecified glaucoma: Secondary | ICD-10-CM | POA: Diagnosis not present

## 2024-06-16 DIAGNOSIS — Z960 Presence of urogenital implants: Secondary | ICD-10-CM | POA: Diagnosis not present

## 2024-06-16 DIAGNOSIS — E039 Hypothyroidism, unspecified: Secondary | ICD-10-CM | POA: Diagnosis not present

## 2024-06-16 DIAGNOSIS — E1122 Type 2 diabetes mellitus with diabetic chronic kidney disease: Secondary | ICD-10-CM | POA: Diagnosis not present

## 2024-06-16 DIAGNOSIS — Z79890 Hormone replacement therapy (postmenopausal): Secondary | ICD-10-CM | POA: Diagnosis not present

## 2024-06-16 DIAGNOSIS — R627 Adult failure to thrive: Secondary | ICD-10-CM | POA: Diagnosis not present

## 2024-06-16 DIAGNOSIS — L89890 Pressure ulcer of other site, unstageable: Secondary | ICD-10-CM | POA: Diagnosis not present

## 2024-06-16 DIAGNOSIS — Z556 Problems related to health literacy: Secondary | ICD-10-CM | POA: Diagnosis not present

## 2024-06-16 DIAGNOSIS — R338 Other retention of urine: Secondary | ICD-10-CM | POA: Diagnosis not present

## 2024-06-16 DIAGNOSIS — Z9181 History of falling: Secondary | ICD-10-CM | POA: Diagnosis not present

## 2024-06-17 DIAGNOSIS — N401 Enlarged prostate with lower urinary tract symptoms: Secondary | ICD-10-CM | POA: Diagnosis not present

## 2024-06-17 DIAGNOSIS — R338 Other retention of urine: Secondary | ICD-10-CM | POA: Diagnosis not present

## 2024-06-17 DIAGNOSIS — N471 Phimosis: Secondary | ICD-10-CM | POA: Diagnosis not present

## 2024-06-18 DIAGNOSIS — Z9181 History of falling: Secondary | ICD-10-CM | POA: Diagnosis not present

## 2024-06-18 DIAGNOSIS — E785 Hyperlipidemia, unspecified: Secondary | ICD-10-CM | POA: Diagnosis not present

## 2024-06-18 DIAGNOSIS — H409 Unspecified glaucoma: Secondary | ICD-10-CM | POA: Diagnosis not present

## 2024-06-18 DIAGNOSIS — Z960 Presence of urogenital implants: Secondary | ICD-10-CM | POA: Diagnosis not present

## 2024-06-18 DIAGNOSIS — Z7982 Long term (current) use of aspirin: Secondary | ICD-10-CM | POA: Diagnosis not present

## 2024-06-18 DIAGNOSIS — E039 Hypothyroidism, unspecified: Secondary | ICD-10-CM | POA: Diagnosis not present

## 2024-06-18 DIAGNOSIS — Z79890 Hormone replacement therapy (postmenopausal): Secondary | ICD-10-CM | POA: Diagnosis not present

## 2024-06-18 DIAGNOSIS — L89620 Pressure ulcer of left heel, unstageable: Secondary | ICD-10-CM | POA: Diagnosis not present

## 2024-06-18 DIAGNOSIS — D649 Anemia, unspecified: Secondary | ICD-10-CM | POA: Diagnosis not present

## 2024-06-18 DIAGNOSIS — M6282 Rhabdomyolysis: Secondary | ICD-10-CM | POA: Diagnosis not present

## 2024-06-18 DIAGNOSIS — E1122 Type 2 diabetes mellitus with diabetic chronic kidney disease: Secondary | ICD-10-CM | POA: Diagnosis not present

## 2024-06-18 DIAGNOSIS — R338 Other retention of urine: Secondary | ICD-10-CM | POA: Diagnosis not present

## 2024-06-18 DIAGNOSIS — Z794 Long term (current) use of insulin: Secondary | ICD-10-CM | POA: Diagnosis not present

## 2024-06-18 DIAGNOSIS — Z556 Problems related to health literacy: Secondary | ICD-10-CM | POA: Diagnosis not present

## 2024-06-18 DIAGNOSIS — I129 Hypertensive chronic kidney disease with stage 1 through stage 4 chronic kidney disease, or unspecified chronic kidney disease: Secondary | ICD-10-CM | POA: Diagnosis not present

## 2024-06-18 DIAGNOSIS — E291 Testicular hypofunction: Secondary | ICD-10-CM | POA: Diagnosis not present

## 2024-06-18 DIAGNOSIS — Z7984 Long term (current) use of oral hypoglycemic drugs: Secondary | ICD-10-CM | POA: Diagnosis not present

## 2024-06-18 DIAGNOSIS — L89890 Pressure ulcer of other site, unstageable: Secondary | ICD-10-CM | POA: Diagnosis not present

## 2024-06-18 DIAGNOSIS — R627 Adult failure to thrive: Secondary | ICD-10-CM | POA: Diagnosis not present

## 2024-06-18 DIAGNOSIS — N1832 Chronic kidney disease, stage 3b: Secondary | ICD-10-CM | POA: Diagnosis not present

## 2024-06-19 DIAGNOSIS — Z79890 Hormone replacement therapy (postmenopausal): Secondary | ICD-10-CM | POA: Diagnosis not present

## 2024-06-19 DIAGNOSIS — Z960 Presence of urogenital implants: Secondary | ICD-10-CM | POA: Diagnosis not present

## 2024-06-19 DIAGNOSIS — Z9181 History of falling: Secondary | ICD-10-CM | POA: Diagnosis not present

## 2024-06-19 DIAGNOSIS — N1832 Chronic kidney disease, stage 3b: Secondary | ICD-10-CM | POA: Diagnosis not present

## 2024-06-19 DIAGNOSIS — L89890 Pressure ulcer of other site, unstageable: Secondary | ICD-10-CM | POA: Diagnosis not present

## 2024-06-19 DIAGNOSIS — R627 Adult failure to thrive: Secondary | ICD-10-CM | POA: Diagnosis not present

## 2024-06-19 DIAGNOSIS — R338 Other retention of urine: Secondary | ICD-10-CM | POA: Diagnosis not present

## 2024-06-19 DIAGNOSIS — L89620 Pressure ulcer of left heel, unstageable: Secondary | ICD-10-CM | POA: Diagnosis not present

## 2024-06-19 DIAGNOSIS — E291 Testicular hypofunction: Secondary | ICD-10-CM | POA: Diagnosis not present

## 2024-06-19 DIAGNOSIS — E1122 Type 2 diabetes mellitus with diabetic chronic kidney disease: Secondary | ICD-10-CM | POA: Diagnosis not present

## 2024-06-19 DIAGNOSIS — Z7982 Long term (current) use of aspirin: Secondary | ICD-10-CM | POA: Diagnosis not present

## 2024-06-19 DIAGNOSIS — Z794 Long term (current) use of insulin: Secondary | ICD-10-CM | POA: Diagnosis not present

## 2024-06-19 DIAGNOSIS — M6282 Rhabdomyolysis: Secondary | ICD-10-CM | POA: Diagnosis not present

## 2024-06-19 DIAGNOSIS — H409 Unspecified glaucoma: Secondary | ICD-10-CM | POA: Diagnosis not present

## 2024-06-19 DIAGNOSIS — E039 Hypothyroidism, unspecified: Secondary | ICD-10-CM | POA: Diagnosis not present

## 2024-06-19 DIAGNOSIS — Z7984 Long term (current) use of oral hypoglycemic drugs: Secondary | ICD-10-CM | POA: Diagnosis not present

## 2024-06-19 DIAGNOSIS — Z556 Problems related to health literacy: Secondary | ICD-10-CM | POA: Diagnosis not present

## 2024-06-19 DIAGNOSIS — I129 Hypertensive chronic kidney disease with stage 1 through stage 4 chronic kidney disease, or unspecified chronic kidney disease: Secondary | ICD-10-CM | POA: Diagnosis not present

## 2024-06-20 DIAGNOSIS — I129 Hypertensive chronic kidney disease with stage 1 through stage 4 chronic kidney disease, or unspecified chronic kidney disease: Secondary | ICD-10-CM | POA: Diagnosis not present

## 2024-06-20 DIAGNOSIS — Z794 Long term (current) use of insulin: Secondary | ICD-10-CM | POA: Diagnosis not present

## 2024-06-20 DIAGNOSIS — N1832 Chronic kidney disease, stage 3b: Secondary | ICD-10-CM | POA: Diagnosis not present

## 2024-06-20 DIAGNOSIS — R338 Other retention of urine: Secondary | ICD-10-CM | POA: Diagnosis not present

## 2024-06-20 DIAGNOSIS — H409 Unspecified glaucoma: Secondary | ICD-10-CM | POA: Diagnosis not present

## 2024-06-20 DIAGNOSIS — M6282 Rhabdomyolysis: Secondary | ICD-10-CM | POA: Diagnosis not present

## 2024-06-20 DIAGNOSIS — Z79890 Hormone replacement therapy (postmenopausal): Secondary | ICD-10-CM | POA: Diagnosis not present

## 2024-06-20 DIAGNOSIS — Z9181 History of falling: Secondary | ICD-10-CM | POA: Diagnosis not present

## 2024-06-20 DIAGNOSIS — E291 Testicular hypofunction: Secondary | ICD-10-CM | POA: Diagnosis not present

## 2024-06-20 DIAGNOSIS — L89890 Pressure ulcer of other site, unstageable: Secondary | ICD-10-CM | POA: Diagnosis not present

## 2024-06-20 DIAGNOSIS — R627 Adult failure to thrive: Secondary | ICD-10-CM | POA: Diagnosis not present

## 2024-06-20 DIAGNOSIS — Z7984 Long term (current) use of oral hypoglycemic drugs: Secondary | ICD-10-CM | POA: Diagnosis not present

## 2024-06-20 DIAGNOSIS — E039 Hypothyroidism, unspecified: Secondary | ICD-10-CM | POA: Diagnosis not present

## 2024-06-20 DIAGNOSIS — E1122 Type 2 diabetes mellitus with diabetic chronic kidney disease: Secondary | ICD-10-CM | POA: Diagnosis not present

## 2024-06-20 DIAGNOSIS — Z556 Problems related to health literacy: Secondary | ICD-10-CM | POA: Diagnosis not present

## 2024-06-20 DIAGNOSIS — Z960 Presence of urogenital implants: Secondary | ICD-10-CM | POA: Diagnosis not present

## 2024-06-20 DIAGNOSIS — Z7982 Long term (current) use of aspirin: Secondary | ICD-10-CM | POA: Diagnosis not present

## 2024-06-20 DIAGNOSIS — L89620 Pressure ulcer of left heel, unstageable: Secondary | ICD-10-CM | POA: Diagnosis not present

## 2024-06-21 DIAGNOSIS — R338 Other retention of urine: Secondary | ICD-10-CM | POA: Diagnosis not present

## 2024-06-21 DIAGNOSIS — M6282 Rhabdomyolysis: Secondary | ICD-10-CM | POA: Diagnosis not present

## 2024-06-21 DIAGNOSIS — E1122 Type 2 diabetes mellitus with diabetic chronic kidney disease: Secondary | ICD-10-CM | POA: Diagnosis not present

## 2024-06-21 DIAGNOSIS — Z9181 History of falling: Secondary | ICD-10-CM | POA: Diagnosis not present

## 2024-06-21 DIAGNOSIS — Z960 Presence of urogenital implants: Secondary | ICD-10-CM | POA: Diagnosis not present

## 2024-06-21 DIAGNOSIS — Z794 Long term (current) use of insulin: Secondary | ICD-10-CM | POA: Diagnosis not present

## 2024-06-21 DIAGNOSIS — L89620 Pressure ulcer of left heel, unstageable: Secondary | ICD-10-CM | POA: Diagnosis not present

## 2024-06-21 DIAGNOSIS — I129 Hypertensive chronic kidney disease with stage 1 through stage 4 chronic kidney disease, or unspecified chronic kidney disease: Secondary | ICD-10-CM | POA: Diagnosis not present

## 2024-06-21 DIAGNOSIS — Z7982 Long term (current) use of aspirin: Secondary | ICD-10-CM | POA: Diagnosis not present

## 2024-06-21 DIAGNOSIS — N1832 Chronic kidney disease, stage 3b: Secondary | ICD-10-CM | POA: Diagnosis not present

## 2024-06-21 DIAGNOSIS — R627 Adult failure to thrive: Secondary | ICD-10-CM | POA: Diagnosis not present

## 2024-06-21 DIAGNOSIS — E039 Hypothyroidism, unspecified: Secondary | ICD-10-CM | POA: Diagnosis not present

## 2024-06-21 DIAGNOSIS — Z79890 Hormone replacement therapy (postmenopausal): Secondary | ICD-10-CM | POA: Diagnosis not present

## 2024-06-21 DIAGNOSIS — L89890 Pressure ulcer of other site, unstageable: Secondary | ICD-10-CM | POA: Diagnosis not present

## 2024-06-21 DIAGNOSIS — H409 Unspecified glaucoma: Secondary | ICD-10-CM | POA: Diagnosis not present

## 2024-06-21 DIAGNOSIS — E291 Testicular hypofunction: Secondary | ICD-10-CM | POA: Diagnosis not present

## 2024-06-21 DIAGNOSIS — Z556 Problems related to health literacy: Secondary | ICD-10-CM | POA: Diagnosis not present

## 2024-06-21 DIAGNOSIS — Z7984 Long term (current) use of oral hypoglycemic drugs: Secondary | ICD-10-CM | POA: Diagnosis not present

## 2024-06-22 ENCOUNTER — Encounter (HOSPITAL_BASED_OUTPATIENT_CLINIC_OR_DEPARTMENT_OTHER): Admitting: Internal Medicine

## 2024-06-22 DIAGNOSIS — L89629 Pressure ulcer of left heel, unspecified stage: Secondary | ICD-10-CM | POA: Diagnosis not present

## 2024-06-22 DIAGNOSIS — E11621 Type 2 diabetes mellitus with foot ulcer: Secondary | ICD-10-CM | POA: Diagnosis not present

## 2024-06-22 DIAGNOSIS — E1122 Type 2 diabetes mellitus with diabetic chronic kidney disease: Secondary | ICD-10-CM | POA: Diagnosis not present

## 2024-06-22 DIAGNOSIS — Z794 Long term (current) use of insulin: Secondary | ICD-10-CM | POA: Diagnosis not present

## 2024-06-22 DIAGNOSIS — N183 Chronic kidney disease, stage 3 unspecified: Secondary | ICD-10-CM | POA: Diagnosis not present

## 2024-06-23 DIAGNOSIS — R627 Adult failure to thrive: Secondary | ICD-10-CM | POA: Diagnosis not present

## 2024-06-23 DIAGNOSIS — I129 Hypertensive chronic kidney disease with stage 1 through stage 4 chronic kidney disease, or unspecified chronic kidney disease: Secondary | ICD-10-CM | POA: Diagnosis not present

## 2024-06-23 DIAGNOSIS — H409 Unspecified glaucoma: Secondary | ICD-10-CM | POA: Diagnosis not present

## 2024-06-23 DIAGNOSIS — Z7982 Long term (current) use of aspirin: Secondary | ICD-10-CM | POA: Diagnosis not present

## 2024-06-23 DIAGNOSIS — M6282 Rhabdomyolysis: Secondary | ICD-10-CM | POA: Diagnosis not present

## 2024-06-23 DIAGNOSIS — Z960 Presence of urogenital implants: Secondary | ICD-10-CM | POA: Diagnosis not present

## 2024-06-23 DIAGNOSIS — Z79890 Hormone replacement therapy (postmenopausal): Secondary | ICD-10-CM | POA: Diagnosis not present

## 2024-06-23 DIAGNOSIS — E291 Testicular hypofunction: Secondary | ICD-10-CM | POA: Diagnosis not present

## 2024-06-23 DIAGNOSIS — L89890 Pressure ulcer of other site, unstageable: Secondary | ICD-10-CM | POA: Diagnosis not present

## 2024-06-23 DIAGNOSIS — Z556 Problems related to health literacy: Secondary | ICD-10-CM | POA: Diagnosis not present

## 2024-06-23 DIAGNOSIS — N1832 Chronic kidney disease, stage 3b: Secondary | ICD-10-CM | POA: Diagnosis not present

## 2024-06-23 DIAGNOSIS — R338 Other retention of urine: Secondary | ICD-10-CM | POA: Diagnosis not present

## 2024-06-23 DIAGNOSIS — L89620 Pressure ulcer of left heel, unstageable: Secondary | ICD-10-CM | POA: Diagnosis not present

## 2024-06-23 DIAGNOSIS — Z7984 Long term (current) use of oral hypoglycemic drugs: Secondary | ICD-10-CM | POA: Diagnosis not present

## 2024-06-23 DIAGNOSIS — Z9181 History of falling: Secondary | ICD-10-CM | POA: Diagnosis not present

## 2024-06-23 DIAGNOSIS — Z794 Long term (current) use of insulin: Secondary | ICD-10-CM | POA: Diagnosis not present

## 2024-06-23 DIAGNOSIS — E039 Hypothyroidism, unspecified: Secondary | ICD-10-CM | POA: Diagnosis not present

## 2024-06-23 DIAGNOSIS — E1122 Type 2 diabetes mellitus with diabetic chronic kidney disease: Secondary | ICD-10-CM | POA: Diagnosis not present

## 2024-06-25 DIAGNOSIS — Z556 Problems related to health literacy: Secondary | ICD-10-CM | POA: Diagnosis not present

## 2024-06-25 DIAGNOSIS — R627 Adult failure to thrive: Secondary | ICD-10-CM | POA: Diagnosis not present

## 2024-06-25 DIAGNOSIS — Z7984 Long term (current) use of oral hypoglycemic drugs: Secondary | ICD-10-CM | POA: Diagnosis not present

## 2024-06-25 DIAGNOSIS — H409 Unspecified glaucoma: Secondary | ICD-10-CM | POA: Diagnosis not present

## 2024-06-25 DIAGNOSIS — Z79890 Hormone replacement therapy (postmenopausal): Secondary | ICD-10-CM | POA: Diagnosis not present

## 2024-06-25 DIAGNOSIS — E039 Hypothyroidism, unspecified: Secondary | ICD-10-CM | POA: Diagnosis not present

## 2024-06-25 DIAGNOSIS — N1832 Chronic kidney disease, stage 3b: Secondary | ICD-10-CM | POA: Diagnosis not present

## 2024-06-25 DIAGNOSIS — Z960 Presence of urogenital implants: Secondary | ICD-10-CM | POA: Diagnosis not present

## 2024-06-25 DIAGNOSIS — E1122 Type 2 diabetes mellitus with diabetic chronic kidney disease: Secondary | ICD-10-CM | POA: Diagnosis not present

## 2024-06-25 DIAGNOSIS — Z794 Long term (current) use of insulin: Secondary | ICD-10-CM | POA: Diagnosis not present

## 2024-06-25 DIAGNOSIS — I129 Hypertensive chronic kidney disease with stage 1 through stage 4 chronic kidney disease, or unspecified chronic kidney disease: Secondary | ICD-10-CM | POA: Diagnosis not present

## 2024-06-25 DIAGNOSIS — R338 Other retention of urine: Secondary | ICD-10-CM | POA: Diagnosis not present

## 2024-06-25 DIAGNOSIS — L89890 Pressure ulcer of other site, unstageable: Secondary | ICD-10-CM | POA: Diagnosis not present

## 2024-06-25 DIAGNOSIS — Z9181 History of falling: Secondary | ICD-10-CM | POA: Diagnosis not present

## 2024-06-25 DIAGNOSIS — L89620 Pressure ulcer of left heel, unstageable: Secondary | ICD-10-CM | POA: Diagnosis not present

## 2024-06-25 DIAGNOSIS — Z7982 Long term (current) use of aspirin: Secondary | ICD-10-CM | POA: Diagnosis not present

## 2024-06-25 DIAGNOSIS — M6282 Rhabdomyolysis: Secondary | ICD-10-CM | POA: Diagnosis not present

## 2024-06-25 DIAGNOSIS — E291 Testicular hypofunction: Secondary | ICD-10-CM | POA: Diagnosis not present

## 2024-06-28 DIAGNOSIS — E291 Testicular hypofunction: Secondary | ICD-10-CM | POA: Diagnosis not present

## 2024-06-28 DIAGNOSIS — Z7984 Long term (current) use of oral hypoglycemic drugs: Secondary | ICD-10-CM | POA: Diagnosis not present

## 2024-06-28 DIAGNOSIS — L89620 Pressure ulcer of left heel, unstageable: Secondary | ICD-10-CM | POA: Diagnosis not present

## 2024-06-28 DIAGNOSIS — R338 Other retention of urine: Secondary | ICD-10-CM | POA: Diagnosis not present

## 2024-06-28 DIAGNOSIS — Z7982 Long term (current) use of aspirin: Secondary | ICD-10-CM | POA: Diagnosis not present

## 2024-06-28 DIAGNOSIS — E039 Hypothyroidism, unspecified: Secondary | ICD-10-CM | POA: Diagnosis not present

## 2024-06-28 DIAGNOSIS — Z79890 Hormone replacement therapy (postmenopausal): Secondary | ICD-10-CM | POA: Diagnosis not present

## 2024-06-28 DIAGNOSIS — N1832 Chronic kidney disease, stage 3b: Secondary | ICD-10-CM | POA: Diagnosis not present

## 2024-06-28 DIAGNOSIS — Z794 Long term (current) use of insulin: Secondary | ICD-10-CM | POA: Diagnosis not present

## 2024-06-28 DIAGNOSIS — M6282 Rhabdomyolysis: Secondary | ICD-10-CM | POA: Diagnosis not present

## 2024-06-28 DIAGNOSIS — Z960 Presence of urogenital implants: Secondary | ICD-10-CM | POA: Diagnosis not present

## 2024-06-28 DIAGNOSIS — Z9181 History of falling: Secondary | ICD-10-CM | POA: Diagnosis not present

## 2024-06-28 DIAGNOSIS — Z556 Problems related to health literacy: Secondary | ICD-10-CM | POA: Diagnosis not present

## 2024-06-28 DIAGNOSIS — H409 Unspecified glaucoma: Secondary | ICD-10-CM | POA: Diagnosis not present

## 2024-06-28 DIAGNOSIS — L89890 Pressure ulcer of other site, unstageable: Secondary | ICD-10-CM | POA: Diagnosis not present

## 2024-06-28 DIAGNOSIS — E1122 Type 2 diabetes mellitus with diabetic chronic kidney disease: Secondary | ICD-10-CM | POA: Diagnosis not present

## 2024-06-28 DIAGNOSIS — R627 Adult failure to thrive: Secondary | ICD-10-CM | POA: Diagnosis not present

## 2024-06-28 DIAGNOSIS — I129 Hypertensive chronic kidney disease with stage 1 through stage 4 chronic kidney disease, or unspecified chronic kidney disease: Secondary | ICD-10-CM | POA: Diagnosis not present

## 2024-06-30 DIAGNOSIS — R338 Other retention of urine: Secondary | ICD-10-CM | POA: Diagnosis not present

## 2024-06-30 DIAGNOSIS — E039 Hypothyroidism, unspecified: Secondary | ICD-10-CM | POA: Diagnosis not present

## 2024-06-30 DIAGNOSIS — E291 Testicular hypofunction: Secondary | ICD-10-CM | POA: Diagnosis not present

## 2024-06-30 DIAGNOSIS — M6282 Rhabdomyolysis: Secondary | ICD-10-CM | POA: Diagnosis not present

## 2024-06-30 DIAGNOSIS — H409 Unspecified glaucoma: Secondary | ICD-10-CM | POA: Diagnosis not present

## 2024-06-30 DIAGNOSIS — E1122 Type 2 diabetes mellitus with diabetic chronic kidney disease: Secondary | ICD-10-CM | POA: Diagnosis not present

## 2024-06-30 DIAGNOSIS — Z556 Problems related to health literacy: Secondary | ICD-10-CM | POA: Diagnosis not present

## 2024-06-30 DIAGNOSIS — I129 Hypertensive chronic kidney disease with stage 1 through stage 4 chronic kidney disease, or unspecified chronic kidney disease: Secondary | ICD-10-CM | POA: Diagnosis not present

## 2024-06-30 DIAGNOSIS — Z79890 Hormone replacement therapy (postmenopausal): Secondary | ICD-10-CM | POA: Diagnosis not present

## 2024-06-30 DIAGNOSIS — L89890 Pressure ulcer of other site, unstageable: Secondary | ICD-10-CM | POA: Diagnosis not present

## 2024-06-30 DIAGNOSIS — N1832 Chronic kidney disease, stage 3b: Secondary | ICD-10-CM | POA: Diagnosis not present

## 2024-06-30 DIAGNOSIS — Z794 Long term (current) use of insulin: Secondary | ICD-10-CM | POA: Diagnosis not present

## 2024-06-30 DIAGNOSIS — Z9181 History of falling: Secondary | ICD-10-CM | POA: Diagnosis not present

## 2024-06-30 DIAGNOSIS — Z7984 Long term (current) use of oral hypoglycemic drugs: Secondary | ICD-10-CM | POA: Diagnosis not present

## 2024-06-30 DIAGNOSIS — R627 Adult failure to thrive: Secondary | ICD-10-CM | POA: Diagnosis not present

## 2024-06-30 DIAGNOSIS — L89620 Pressure ulcer of left heel, unstageable: Secondary | ICD-10-CM | POA: Diagnosis not present

## 2024-06-30 DIAGNOSIS — Z7982 Long term (current) use of aspirin: Secondary | ICD-10-CM | POA: Diagnosis not present

## 2024-06-30 DIAGNOSIS — Z960 Presence of urogenital implants: Secondary | ICD-10-CM | POA: Diagnosis not present

## 2024-07-01 ENCOUNTER — Encounter (HOSPITAL_BASED_OUTPATIENT_CLINIC_OR_DEPARTMENT_OTHER): Attending: Internal Medicine | Admitting: Internal Medicine

## 2024-07-01 DIAGNOSIS — E11621 Type 2 diabetes mellitus with foot ulcer: Secondary | ICD-10-CM | POA: Diagnosis not present

## 2024-07-01 DIAGNOSIS — L89629 Pressure ulcer of left heel, unspecified stage: Secondary | ICD-10-CM | POA: Insufficient documentation

## 2024-07-02 DIAGNOSIS — E291 Testicular hypofunction: Secondary | ICD-10-CM | POA: Diagnosis not present

## 2024-07-02 DIAGNOSIS — Z79890 Hormone replacement therapy (postmenopausal): Secondary | ICD-10-CM | POA: Diagnosis not present

## 2024-07-02 DIAGNOSIS — Z7982 Long term (current) use of aspirin: Secondary | ICD-10-CM | POA: Diagnosis not present

## 2024-07-02 DIAGNOSIS — E1122 Type 2 diabetes mellitus with diabetic chronic kidney disease: Secondary | ICD-10-CM | POA: Diagnosis not present

## 2024-07-02 DIAGNOSIS — E039 Hypothyroidism, unspecified: Secondary | ICD-10-CM | POA: Diagnosis not present

## 2024-07-02 DIAGNOSIS — Z960 Presence of urogenital implants: Secondary | ICD-10-CM | POA: Diagnosis not present

## 2024-07-02 DIAGNOSIS — Z7984 Long term (current) use of oral hypoglycemic drugs: Secondary | ICD-10-CM | POA: Diagnosis not present

## 2024-07-02 DIAGNOSIS — I129 Hypertensive chronic kidney disease with stage 1 through stage 4 chronic kidney disease, or unspecified chronic kidney disease: Secondary | ICD-10-CM | POA: Diagnosis not present

## 2024-07-02 DIAGNOSIS — Z556 Problems related to health literacy: Secondary | ICD-10-CM | POA: Diagnosis not present

## 2024-07-02 DIAGNOSIS — L89620 Pressure ulcer of left heel, unstageable: Secondary | ICD-10-CM | POA: Diagnosis not present

## 2024-07-02 DIAGNOSIS — L89890 Pressure ulcer of other site, unstageable: Secondary | ICD-10-CM | POA: Diagnosis not present

## 2024-07-02 DIAGNOSIS — N1832 Chronic kidney disease, stage 3b: Secondary | ICD-10-CM | POA: Diagnosis not present

## 2024-07-02 DIAGNOSIS — R338 Other retention of urine: Secondary | ICD-10-CM | POA: Diagnosis not present

## 2024-07-02 DIAGNOSIS — Z794 Long term (current) use of insulin: Secondary | ICD-10-CM | POA: Diagnosis not present

## 2024-07-02 DIAGNOSIS — H409 Unspecified glaucoma: Secondary | ICD-10-CM | POA: Diagnosis not present

## 2024-07-02 DIAGNOSIS — M6282 Rhabdomyolysis: Secondary | ICD-10-CM | POA: Diagnosis not present

## 2024-07-02 DIAGNOSIS — Z9181 History of falling: Secondary | ICD-10-CM | POA: Diagnosis not present

## 2024-07-02 DIAGNOSIS — R627 Adult failure to thrive: Secondary | ICD-10-CM | POA: Diagnosis not present

## 2024-07-05 DIAGNOSIS — Z79890 Hormone replacement therapy (postmenopausal): Secondary | ICD-10-CM | POA: Diagnosis not present

## 2024-07-05 DIAGNOSIS — H409 Unspecified glaucoma: Secondary | ICD-10-CM | POA: Diagnosis not present

## 2024-07-05 DIAGNOSIS — Z7982 Long term (current) use of aspirin: Secondary | ICD-10-CM | POA: Diagnosis not present

## 2024-07-05 DIAGNOSIS — Z794 Long term (current) use of insulin: Secondary | ICD-10-CM | POA: Diagnosis not present

## 2024-07-05 DIAGNOSIS — I129 Hypertensive chronic kidney disease with stage 1 through stage 4 chronic kidney disease, or unspecified chronic kidney disease: Secondary | ICD-10-CM | POA: Diagnosis not present

## 2024-07-05 DIAGNOSIS — L89620 Pressure ulcer of left heel, unstageable: Secondary | ICD-10-CM | POA: Diagnosis not present

## 2024-07-05 DIAGNOSIS — L89890 Pressure ulcer of other site, unstageable: Secondary | ICD-10-CM | POA: Diagnosis not present

## 2024-07-05 DIAGNOSIS — E1122 Type 2 diabetes mellitus with diabetic chronic kidney disease: Secondary | ICD-10-CM | POA: Diagnosis not present

## 2024-07-05 DIAGNOSIS — Z556 Problems related to health literacy: Secondary | ICD-10-CM | POA: Diagnosis not present

## 2024-07-05 DIAGNOSIS — M6282 Rhabdomyolysis: Secondary | ICD-10-CM | POA: Diagnosis not present

## 2024-07-05 DIAGNOSIS — R338 Other retention of urine: Secondary | ICD-10-CM | POA: Diagnosis not present

## 2024-07-05 DIAGNOSIS — E291 Testicular hypofunction: Secondary | ICD-10-CM | POA: Diagnosis not present

## 2024-07-05 DIAGNOSIS — E039 Hypothyroidism, unspecified: Secondary | ICD-10-CM | POA: Diagnosis not present

## 2024-07-05 DIAGNOSIS — Z960 Presence of urogenital implants: Secondary | ICD-10-CM | POA: Diagnosis not present

## 2024-07-05 DIAGNOSIS — Z9181 History of falling: Secondary | ICD-10-CM | POA: Diagnosis not present

## 2024-07-05 DIAGNOSIS — R627 Adult failure to thrive: Secondary | ICD-10-CM | POA: Diagnosis not present

## 2024-07-05 DIAGNOSIS — Z7984 Long term (current) use of oral hypoglycemic drugs: Secondary | ICD-10-CM | POA: Diagnosis not present

## 2024-07-05 DIAGNOSIS — N1832 Chronic kidney disease, stage 3b: Secondary | ICD-10-CM | POA: Diagnosis not present

## 2024-07-07 DIAGNOSIS — I129 Hypertensive chronic kidney disease with stage 1 through stage 4 chronic kidney disease, or unspecified chronic kidney disease: Secondary | ICD-10-CM | POA: Diagnosis not present

## 2024-07-07 DIAGNOSIS — E039 Hypothyroidism, unspecified: Secondary | ICD-10-CM | POA: Diagnosis not present

## 2024-07-07 DIAGNOSIS — R627 Adult failure to thrive: Secondary | ICD-10-CM | POA: Diagnosis not present

## 2024-07-07 DIAGNOSIS — E291 Testicular hypofunction: Secondary | ICD-10-CM | POA: Diagnosis not present

## 2024-07-07 DIAGNOSIS — Z556 Problems related to health literacy: Secondary | ICD-10-CM | POA: Diagnosis not present

## 2024-07-07 DIAGNOSIS — Z7982 Long term (current) use of aspirin: Secondary | ICD-10-CM | POA: Diagnosis not present

## 2024-07-07 DIAGNOSIS — L89620 Pressure ulcer of left heel, unstageable: Secondary | ICD-10-CM | POA: Diagnosis not present

## 2024-07-07 DIAGNOSIS — M6282 Rhabdomyolysis: Secondary | ICD-10-CM | POA: Diagnosis not present

## 2024-07-07 DIAGNOSIS — N1832 Chronic kidney disease, stage 3b: Secondary | ICD-10-CM | POA: Diagnosis not present

## 2024-07-07 DIAGNOSIS — Z79890 Hormone replacement therapy (postmenopausal): Secondary | ICD-10-CM | POA: Diagnosis not present

## 2024-07-07 DIAGNOSIS — R338 Other retention of urine: Secondary | ICD-10-CM | POA: Diagnosis not present

## 2024-07-07 DIAGNOSIS — E1122 Type 2 diabetes mellitus with diabetic chronic kidney disease: Secondary | ICD-10-CM | POA: Diagnosis not present

## 2024-07-07 DIAGNOSIS — Z960 Presence of urogenital implants: Secondary | ICD-10-CM | POA: Diagnosis not present

## 2024-07-07 DIAGNOSIS — H409 Unspecified glaucoma: Secondary | ICD-10-CM | POA: Diagnosis not present

## 2024-07-07 DIAGNOSIS — Z9181 History of falling: Secondary | ICD-10-CM | POA: Diagnosis not present

## 2024-07-07 DIAGNOSIS — Z794 Long term (current) use of insulin: Secondary | ICD-10-CM | POA: Diagnosis not present

## 2024-07-07 DIAGNOSIS — Z7984 Long term (current) use of oral hypoglycemic drugs: Secondary | ICD-10-CM | POA: Diagnosis not present

## 2024-07-07 DIAGNOSIS — L89890 Pressure ulcer of other site, unstageable: Secondary | ICD-10-CM | POA: Diagnosis not present

## 2024-07-08 DIAGNOSIS — N1832 Chronic kidney disease, stage 3b: Secondary | ICD-10-CM | POA: Diagnosis not present

## 2024-07-08 DIAGNOSIS — I129 Hypertensive chronic kidney disease with stage 1 through stage 4 chronic kidney disease, or unspecified chronic kidney disease: Secondary | ICD-10-CM | POA: Diagnosis not present

## 2024-07-08 DIAGNOSIS — Z794 Long term (current) use of insulin: Secondary | ICD-10-CM | POA: Diagnosis not present

## 2024-07-08 DIAGNOSIS — E1122 Type 2 diabetes mellitus with diabetic chronic kidney disease: Secondary | ICD-10-CM | POA: Diagnosis not present

## 2024-07-09 DIAGNOSIS — Z7982 Long term (current) use of aspirin: Secondary | ICD-10-CM | POA: Diagnosis not present

## 2024-07-09 DIAGNOSIS — I129 Hypertensive chronic kidney disease with stage 1 through stage 4 chronic kidney disease, or unspecified chronic kidney disease: Secondary | ICD-10-CM | POA: Diagnosis not present

## 2024-07-09 DIAGNOSIS — E291 Testicular hypofunction: Secondary | ICD-10-CM | POA: Diagnosis not present

## 2024-07-09 DIAGNOSIS — E039 Hypothyroidism, unspecified: Secondary | ICD-10-CM | POA: Diagnosis not present

## 2024-07-09 DIAGNOSIS — Z9181 History of falling: Secondary | ICD-10-CM | POA: Diagnosis not present

## 2024-07-09 DIAGNOSIS — N1832 Chronic kidney disease, stage 3b: Secondary | ICD-10-CM | POA: Diagnosis not present

## 2024-07-09 DIAGNOSIS — Z960 Presence of urogenital implants: Secondary | ICD-10-CM | POA: Diagnosis not present

## 2024-07-09 DIAGNOSIS — E1122 Type 2 diabetes mellitus with diabetic chronic kidney disease: Secondary | ICD-10-CM | POA: Diagnosis not present

## 2024-07-09 DIAGNOSIS — H409 Unspecified glaucoma: Secondary | ICD-10-CM | POA: Diagnosis not present

## 2024-07-09 DIAGNOSIS — L89890 Pressure ulcer of other site, unstageable: Secondary | ICD-10-CM | POA: Diagnosis not present

## 2024-07-09 DIAGNOSIS — R627 Adult failure to thrive: Secondary | ICD-10-CM | POA: Diagnosis not present

## 2024-07-09 DIAGNOSIS — Z7984 Long term (current) use of oral hypoglycemic drugs: Secondary | ICD-10-CM | POA: Diagnosis not present

## 2024-07-09 DIAGNOSIS — M6282 Rhabdomyolysis: Secondary | ICD-10-CM | POA: Diagnosis not present

## 2024-07-09 DIAGNOSIS — Z79890 Hormone replacement therapy (postmenopausal): Secondary | ICD-10-CM | POA: Diagnosis not present

## 2024-07-09 DIAGNOSIS — Z794 Long term (current) use of insulin: Secondary | ICD-10-CM | POA: Diagnosis not present

## 2024-07-09 DIAGNOSIS — R338 Other retention of urine: Secondary | ICD-10-CM | POA: Diagnosis not present

## 2024-07-09 DIAGNOSIS — L89620 Pressure ulcer of left heel, unstageable: Secondary | ICD-10-CM | POA: Diagnosis not present

## 2024-07-09 DIAGNOSIS — Z556 Problems related to health literacy: Secondary | ICD-10-CM | POA: Diagnosis not present

## 2024-07-12 DIAGNOSIS — Z7984 Long term (current) use of oral hypoglycemic drugs: Secondary | ICD-10-CM | POA: Diagnosis not present

## 2024-07-12 DIAGNOSIS — Z79890 Hormone replacement therapy (postmenopausal): Secondary | ICD-10-CM | POA: Diagnosis not present

## 2024-07-12 DIAGNOSIS — E291 Testicular hypofunction: Secondary | ICD-10-CM | POA: Diagnosis not present

## 2024-07-12 DIAGNOSIS — H409 Unspecified glaucoma: Secondary | ICD-10-CM | POA: Diagnosis not present

## 2024-07-12 DIAGNOSIS — N1832 Chronic kidney disease, stage 3b: Secondary | ICD-10-CM | POA: Diagnosis not present

## 2024-07-12 DIAGNOSIS — R338 Other retention of urine: Secondary | ICD-10-CM | POA: Diagnosis not present

## 2024-07-12 DIAGNOSIS — Z7982 Long term (current) use of aspirin: Secondary | ICD-10-CM | POA: Diagnosis not present

## 2024-07-12 DIAGNOSIS — M6282 Rhabdomyolysis: Secondary | ICD-10-CM | POA: Diagnosis not present

## 2024-07-12 DIAGNOSIS — E1122 Type 2 diabetes mellitus with diabetic chronic kidney disease: Secondary | ICD-10-CM | POA: Diagnosis not present

## 2024-07-12 DIAGNOSIS — Z9181 History of falling: Secondary | ICD-10-CM | POA: Diagnosis not present

## 2024-07-12 DIAGNOSIS — E039 Hypothyroidism, unspecified: Secondary | ICD-10-CM | POA: Diagnosis not present

## 2024-07-12 DIAGNOSIS — L89890 Pressure ulcer of other site, unstageable: Secondary | ICD-10-CM | POA: Diagnosis not present

## 2024-07-12 DIAGNOSIS — L89620 Pressure ulcer of left heel, unstageable: Secondary | ICD-10-CM | POA: Diagnosis not present

## 2024-07-12 DIAGNOSIS — I129 Hypertensive chronic kidney disease with stage 1 through stage 4 chronic kidney disease, or unspecified chronic kidney disease: Secondary | ICD-10-CM | POA: Diagnosis not present

## 2024-07-12 DIAGNOSIS — L89629 Pressure ulcer of left heel, unspecified stage: Secondary | ICD-10-CM | POA: Diagnosis not present

## 2024-07-12 DIAGNOSIS — Z960 Presence of urogenital implants: Secondary | ICD-10-CM | POA: Diagnosis not present

## 2024-07-12 DIAGNOSIS — Z556 Problems related to health literacy: Secondary | ICD-10-CM | POA: Diagnosis not present

## 2024-07-12 DIAGNOSIS — R627 Adult failure to thrive: Secondary | ICD-10-CM | POA: Diagnosis not present

## 2024-07-12 DIAGNOSIS — Z794 Long term (current) use of insulin: Secondary | ICD-10-CM | POA: Diagnosis not present

## 2024-07-14 DIAGNOSIS — Z794 Long term (current) use of insulin: Secondary | ICD-10-CM | POA: Diagnosis not present

## 2024-07-14 DIAGNOSIS — Z7982 Long term (current) use of aspirin: Secondary | ICD-10-CM | POA: Diagnosis not present

## 2024-07-14 DIAGNOSIS — Z7984 Long term (current) use of oral hypoglycemic drugs: Secondary | ICD-10-CM | POA: Diagnosis not present

## 2024-07-14 DIAGNOSIS — R338 Other retention of urine: Secondary | ICD-10-CM | POA: Diagnosis not present

## 2024-07-14 DIAGNOSIS — Z79890 Hormone replacement therapy (postmenopausal): Secondary | ICD-10-CM | POA: Diagnosis not present

## 2024-07-14 DIAGNOSIS — M6282 Rhabdomyolysis: Secondary | ICD-10-CM | POA: Diagnosis not present

## 2024-07-14 DIAGNOSIS — E039 Hypothyroidism, unspecified: Secondary | ICD-10-CM | POA: Diagnosis not present

## 2024-07-14 DIAGNOSIS — R627 Adult failure to thrive: Secondary | ICD-10-CM | POA: Diagnosis not present

## 2024-07-14 DIAGNOSIS — H409 Unspecified glaucoma: Secondary | ICD-10-CM | POA: Diagnosis not present

## 2024-07-14 DIAGNOSIS — L89890 Pressure ulcer of other site, unstageable: Secondary | ICD-10-CM | POA: Diagnosis not present

## 2024-07-14 DIAGNOSIS — E291 Testicular hypofunction: Secondary | ICD-10-CM | POA: Diagnosis not present

## 2024-07-14 DIAGNOSIS — N1832 Chronic kidney disease, stage 3b: Secondary | ICD-10-CM | POA: Diagnosis not present

## 2024-07-14 DIAGNOSIS — Z960 Presence of urogenital implants: Secondary | ICD-10-CM | POA: Diagnosis not present

## 2024-07-14 DIAGNOSIS — I129 Hypertensive chronic kidney disease with stage 1 through stage 4 chronic kidney disease, or unspecified chronic kidney disease: Secondary | ICD-10-CM | POA: Diagnosis not present

## 2024-07-14 DIAGNOSIS — Z9181 History of falling: Secondary | ICD-10-CM | POA: Diagnosis not present

## 2024-07-14 DIAGNOSIS — Z556 Problems related to health literacy: Secondary | ICD-10-CM | POA: Diagnosis not present

## 2024-07-14 DIAGNOSIS — L89620 Pressure ulcer of left heel, unstageable: Secondary | ICD-10-CM | POA: Diagnosis not present

## 2024-07-14 DIAGNOSIS — E1122 Type 2 diabetes mellitus with diabetic chronic kidney disease: Secondary | ICD-10-CM | POA: Diagnosis not present

## 2024-07-15 ENCOUNTER — Encounter (HOSPITAL_BASED_OUTPATIENT_CLINIC_OR_DEPARTMENT_OTHER): Admitting: Internal Medicine

## 2024-07-15 DIAGNOSIS — L89629 Pressure ulcer of left heel, unspecified stage: Secondary | ICD-10-CM

## 2024-07-15 DIAGNOSIS — E11621 Type 2 diabetes mellitus with foot ulcer: Secondary | ICD-10-CM | POA: Diagnosis not present

## 2024-07-16 DIAGNOSIS — Z794 Long term (current) use of insulin: Secondary | ICD-10-CM | POA: Diagnosis not present

## 2024-07-16 DIAGNOSIS — Z9181 History of falling: Secondary | ICD-10-CM | POA: Diagnosis not present

## 2024-07-16 DIAGNOSIS — E039 Hypothyroidism, unspecified: Secondary | ICD-10-CM | POA: Diagnosis not present

## 2024-07-16 DIAGNOSIS — Z79890 Hormone replacement therapy (postmenopausal): Secondary | ICD-10-CM | POA: Diagnosis not present

## 2024-07-16 DIAGNOSIS — L89620 Pressure ulcer of left heel, unstageable: Secondary | ICD-10-CM | POA: Diagnosis not present

## 2024-07-16 DIAGNOSIS — N1832 Chronic kidney disease, stage 3b: Secondary | ICD-10-CM | POA: Diagnosis not present

## 2024-07-16 DIAGNOSIS — H409 Unspecified glaucoma: Secondary | ICD-10-CM | POA: Diagnosis not present

## 2024-07-16 DIAGNOSIS — L89890 Pressure ulcer of other site, unstageable: Secondary | ICD-10-CM | POA: Diagnosis not present

## 2024-07-16 DIAGNOSIS — R338 Other retention of urine: Secondary | ICD-10-CM | POA: Diagnosis not present

## 2024-07-16 DIAGNOSIS — I129 Hypertensive chronic kidney disease with stage 1 through stage 4 chronic kidney disease, or unspecified chronic kidney disease: Secondary | ICD-10-CM | POA: Diagnosis not present

## 2024-07-16 DIAGNOSIS — Z960 Presence of urogenital implants: Secondary | ICD-10-CM | POA: Diagnosis not present

## 2024-07-16 DIAGNOSIS — Z7982 Long term (current) use of aspirin: Secondary | ICD-10-CM | POA: Diagnosis not present

## 2024-07-16 DIAGNOSIS — M6282 Rhabdomyolysis: Secondary | ICD-10-CM | POA: Diagnosis not present

## 2024-07-16 DIAGNOSIS — Z556 Problems related to health literacy: Secondary | ICD-10-CM | POA: Diagnosis not present

## 2024-07-16 DIAGNOSIS — R627 Adult failure to thrive: Secondary | ICD-10-CM | POA: Diagnosis not present

## 2024-07-16 DIAGNOSIS — E1122 Type 2 diabetes mellitus with diabetic chronic kidney disease: Secondary | ICD-10-CM | POA: Diagnosis not present

## 2024-07-16 DIAGNOSIS — E291 Testicular hypofunction: Secondary | ICD-10-CM | POA: Diagnosis not present

## 2024-07-16 DIAGNOSIS — Z7984 Long term (current) use of oral hypoglycemic drugs: Secondary | ICD-10-CM | POA: Diagnosis not present

## 2024-07-18 ENCOUNTER — Encounter (HOSPITAL_BASED_OUTPATIENT_CLINIC_OR_DEPARTMENT_OTHER): Admitting: Internal Medicine

## 2024-07-19 DIAGNOSIS — N471 Phimosis: Secondary | ICD-10-CM | POA: Diagnosis not present

## 2024-07-19 DIAGNOSIS — N401 Enlarged prostate with lower urinary tract symptoms: Secondary | ICD-10-CM | POA: Diagnosis not present

## 2024-07-19 DIAGNOSIS — R338 Other retention of urine: Secondary | ICD-10-CM | POA: Diagnosis not present

## 2024-07-20 DIAGNOSIS — L89890 Pressure ulcer of other site, unstageable: Secondary | ICD-10-CM | POA: Diagnosis not present

## 2024-07-20 DIAGNOSIS — E291 Testicular hypofunction: Secondary | ICD-10-CM | POA: Diagnosis not present

## 2024-07-20 DIAGNOSIS — Z556 Problems related to health literacy: Secondary | ICD-10-CM | POA: Diagnosis not present

## 2024-07-20 DIAGNOSIS — Z79890 Hormone replacement therapy (postmenopausal): Secondary | ICD-10-CM | POA: Diagnosis not present

## 2024-07-20 DIAGNOSIS — E1122 Type 2 diabetes mellitus with diabetic chronic kidney disease: Secondary | ICD-10-CM | POA: Diagnosis not present

## 2024-07-20 DIAGNOSIS — Z7984 Long term (current) use of oral hypoglycemic drugs: Secondary | ICD-10-CM | POA: Diagnosis not present

## 2024-07-20 DIAGNOSIS — L89620 Pressure ulcer of left heel, unstageable: Secondary | ICD-10-CM | POA: Diagnosis not present

## 2024-07-20 DIAGNOSIS — H409 Unspecified glaucoma: Secondary | ICD-10-CM | POA: Diagnosis not present

## 2024-07-20 DIAGNOSIS — Z7982 Long term (current) use of aspirin: Secondary | ICD-10-CM | POA: Diagnosis not present

## 2024-07-20 DIAGNOSIS — N1832 Chronic kidney disease, stage 3b: Secondary | ICD-10-CM | POA: Diagnosis not present

## 2024-07-20 DIAGNOSIS — E039 Hypothyroidism, unspecified: Secondary | ICD-10-CM | POA: Diagnosis not present

## 2024-07-20 DIAGNOSIS — Z9181 History of falling: Secondary | ICD-10-CM | POA: Diagnosis not present

## 2024-07-20 DIAGNOSIS — Z794 Long term (current) use of insulin: Secondary | ICD-10-CM | POA: Diagnosis not present

## 2024-07-20 DIAGNOSIS — M6282 Rhabdomyolysis: Secondary | ICD-10-CM | POA: Diagnosis not present

## 2024-07-20 DIAGNOSIS — Z960 Presence of urogenital implants: Secondary | ICD-10-CM | POA: Diagnosis not present

## 2024-07-20 DIAGNOSIS — R338 Other retention of urine: Secondary | ICD-10-CM | POA: Diagnosis not present

## 2024-07-20 DIAGNOSIS — I129 Hypertensive chronic kidney disease with stage 1 through stage 4 chronic kidney disease, or unspecified chronic kidney disease: Secondary | ICD-10-CM | POA: Diagnosis not present

## 2024-07-20 DIAGNOSIS — R627 Adult failure to thrive: Secondary | ICD-10-CM | POA: Diagnosis not present

## 2024-07-21 DIAGNOSIS — Z7984 Long term (current) use of oral hypoglycemic drugs: Secondary | ICD-10-CM | POA: Diagnosis not present

## 2024-07-21 DIAGNOSIS — M6282 Rhabdomyolysis: Secondary | ICD-10-CM | POA: Diagnosis not present

## 2024-07-21 DIAGNOSIS — R338 Other retention of urine: Secondary | ICD-10-CM | POA: Diagnosis not present

## 2024-07-21 DIAGNOSIS — Z960 Presence of urogenital implants: Secondary | ICD-10-CM | POA: Diagnosis not present

## 2024-07-21 DIAGNOSIS — Z7982 Long term (current) use of aspirin: Secondary | ICD-10-CM | POA: Diagnosis not present

## 2024-07-21 DIAGNOSIS — L89890 Pressure ulcer of other site, unstageable: Secondary | ICD-10-CM | POA: Diagnosis not present

## 2024-07-21 DIAGNOSIS — I129 Hypertensive chronic kidney disease with stage 1 through stage 4 chronic kidney disease, or unspecified chronic kidney disease: Secondary | ICD-10-CM | POA: Diagnosis not present

## 2024-07-21 DIAGNOSIS — L89620 Pressure ulcer of left heel, unstageable: Secondary | ICD-10-CM | POA: Diagnosis not present

## 2024-07-21 DIAGNOSIS — Z556 Problems related to health literacy: Secondary | ICD-10-CM | POA: Diagnosis not present

## 2024-07-21 DIAGNOSIS — N1832 Chronic kidney disease, stage 3b: Secondary | ICD-10-CM | POA: Diagnosis not present

## 2024-07-21 DIAGNOSIS — R627 Adult failure to thrive: Secondary | ICD-10-CM | POA: Diagnosis not present

## 2024-07-21 DIAGNOSIS — E1122 Type 2 diabetes mellitus with diabetic chronic kidney disease: Secondary | ICD-10-CM | POA: Diagnosis not present

## 2024-07-21 DIAGNOSIS — E291 Testicular hypofunction: Secondary | ICD-10-CM | POA: Diagnosis not present

## 2024-07-21 DIAGNOSIS — Z79890 Hormone replacement therapy (postmenopausal): Secondary | ICD-10-CM | POA: Diagnosis not present

## 2024-07-21 DIAGNOSIS — Z9181 History of falling: Secondary | ICD-10-CM | POA: Diagnosis not present

## 2024-07-21 DIAGNOSIS — E039 Hypothyroidism, unspecified: Secondary | ICD-10-CM | POA: Diagnosis not present

## 2024-07-21 DIAGNOSIS — Z794 Long term (current) use of insulin: Secondary | ICD-10-CM | POA: Diagnosis not present

## 2024-07-21 DIAGNOSIS — H409 Unspecified glaucoma: Secondary | ICD-10-CM | POA: Diagnosis not present

## 2024-07-22 DIAGNOSIS — E119 Type 2 diabetes mellitus without complications: Secondary | ICD-10-CM | POA: Diagnosis not present

## 2024-07-22 DIAGNOSIS — H353231 Exudative age-related macular degeneration, bilateral, with active choroidal neovascularization: Secondary | ICD-10-CM | POA: Diagnosis not present

## 2024-07-22 DIAGNOSIS — D649 Anemia, unspecified: Secondary | ICD-10-CM | POA: Diagnosis not present

## 2024-07-23 DIAGNOSIS — Z556 Problems related to health literacy: Secondary | ICD-10-CM | POA: Diagnosis not present

## 2024-07-23 DIAGNOSIS — Z960 Presence of urogenital implants: Secondary | ICD-10-CM | POA: Diagnosis not present

## 2024-07-23 DIAGNOSIS — Z79890 Hormone replacement therapy (postmenopausal): Secondary | ICD-10-CM | POA: Diagnosis not present

## 2024-07-23 DIAGNOSIS — E291 Testicular hypofunction: Secondary | ICD-10-CM | POA: Diagnosis not present

## 2024-07-23 DIAGNOSIS — Z794 Long term (current) use of insulin: Secondary | ICD-10-CM | POA: Diagnosis not present

## 2024-07-23 DIAGNOSIS — R338 Other retention of urine: Secondary | ICD-10-CM | POA: Diagnosis not present

## 2024-07-23 DIAGNOSIS — Z7982 Long term (current) use of aspirin: Secondary | ICD-10-CM | POA: Diagnosis not present

## 2024-07-23 DIAGNOSIS — E1122 Type 2 diabetes mellitus with diabetic chronic kidney disease: Secondary | ICD-10-CM | POA: Diagnosis not present

## 2024-07-23 DIAGNOSIS — Z9181 History of falling: Secondary | ICD-10-CM | POA: Diagnosis not present

## 2024-07-23 DIAGNOSIS — I129 Hypertensive chronic kidney disease with stage 1 through stage 4 chronic kidney disease, or unspecified chronic kidney disease: Secondary | ICD-10-CM | POA: Diagnosis not present

## 2024-07-23 DIAGNOSIS — L89890 Pressure ulcer of other site, unstageable: Secondary | ICD-10-CM | POA: Diagnosis not present

## 2024-07-23 DIAGNOSIS — L89620 Pressure ulcer of left heel, unstageable: Secondary | ICD-10-CM | POA: Diagnosis not present

## 2024-07-23 DIAGNOSIS — M6282 Rhabdomyolysis: Secondary | ICD-10-CM | POA: Diagnosis not present

## 2024-07-23 DIAGNOSIS — E039 Hypothyroidism, unspecified: Secondary | ICD-10-CM | POA: Diagnosis not present

## 2024-07-23 DIAGNOSIS — Z7984 Long term (current) use of oral hypoglycemic drugs: Secondary | ICD-10-CM | POA: Diagnosis not present

## 2024-07-23 DIAGNOSIS — N1832 Chronic kidney disease, stage 3b: Secondary | ICD-10-CM | POA: Diagnosis not present

## 2024-07-23 DIAGNOSIS — R627 Adult failure to thrive: Secondary | ICD-10-CM | POA: Diagnosis not present

## 2024-07-23 DIAGNOSIS — H409 Unspecified glaucoma: Secondary | ICD-10-CM | POA: Diagnosis not present

## 2024-07-26 DIAGNOSIS — Z794 Long term (current) use of insulin: Secondary | ICD-10-CM | POA: Diagnosis not present

## 2024-07-26 DIAGNOSIS — R627 Adult failure to thrive: Secondary | ICD-10-CM | POA: Diagnosis not present

## 2024-07-26 DIAGNOSIS — Z7984 Long term (current) use of oral hypoglycemic drugs: Secondary | ICD-10-CM | POA: Diagnosis not present

## 2024-07-26 DIAGNOSIS — Z7982 Long term (current) use of aspirin: Secondary | ICD-10-CM | POA: Diagnosis not present

## 2024-07-26 DIAGNOSIS — L89890 Pressure ulcer of other site, unstageable: Secondary | ICD-10-CM | POA: Diagnosis not present

## 2024-07-26 DIAGNOSIS — E291 Testicular hypofunction: Secondary | ICD-10-CM | POA: Diagnosis not present

## 2024-07-26 DIAGNOSIS — E039 Hypothyroidism, unspecified: Secondary | ICD-10-CM | POA: Diagnosis not present

## 2024-07-26 DIAGNOSIS — I129 Hypertensive chronic kidney disease with stage 1 through stage 4 chronic kidney disease, or unspecified chronic kidney disease: Secondary | ICD-10-CM | POA: Diagnosis not present

## 2024-07-26 DIAGNOSIS — Z79890 Hormone replacement therapy (postmenopausal): Secondary | ICD-10-CM | POA: Diagnosis not present

## 2024-07-26 DIAGNOSIS — M6282 Rhabdomyolysis: Secondary | ICD-10-CM | POA: Diagnosis not present

## 2024-07-26 DIAGNOSIS — H409 Unspecified glaucoma: Secondary | ICD-10-CM | POA: Diagnosis not present

## 2024-07-26 DIAGNOSIS — N1832 Chronic kidney disease, stage 3b: Secondary | ICD-10-CM | POA: Diagnosis not present

## 2024-07-26 DIAGNOSIS — E1122 Type 2 diabetes mellitus with diabetic chronic kidney disease: Secondary | ICD-10-CM | POA: Diagnosis not present

## 2024-07-26 DIAGNOSIS — R338 Other retention of urine: Secondary | ICD-10-CM | POA: Diagnosis not present

## 2024-07-26 DIAGNOSIS — Z556 Problems related to health literacy: Secondary | ICD-10-CM | POA: Diagnosis not present

## 2024-07-26 DIAGNOSIS — Z960 Presence of urogenital implants: Secondary | ICD-10-CM | POA: Diagnosis not present

## 2024-07-26 DIAGNOSIS — Z9181 History of falling: Secondary | ICD-10-CM | POA: Diagnosis not present

## 2024-07-26 DIAGNOSIS — L89620 Pressure ulcer of left heel, unstageable: Secondary | ICD-10-CM | POA: Diagnosis not present

## 2024-07-27 DIAGNOSIS — E1122 Type 2 diabetes mellitus with diabetic chronic kidney disease: Secondary | ICD-10-CM | POA: Diagnosis not present

## 2024-07-27 DIAGNOSIS — N1832 Chronic kidney disease, stage 3b: Secondary | ICD-10-CM | POA: Diagnosis not present

## 2024-07-27 DIAGNOSIS — Z794 Long term (current) use of insulin: Secondary | ICD-10-CM | POA: Diagnosis not present

## 2024-07-27 DIAGNOSIS — Z7984 Long term (current) use of oral hypoglycemic drugs: Secondary | ICD-10-CM | POA: Diagnosis not present

## 2024-07-27 DIAGNOSIS — H5203 Hypermetropia, bilateral: Secondary | ICD-10-CM | POA: Diagnosis not present

## 2024-07-28 DIAGNOSIS — L89620 Pressure ulcer of left heel, unstageable: Secondary | ICD-10-CM | POA: Diagnosis not present

## 2024-07-28 DIAGNOSIS — Z794 Long term (current) use of insulin: Secondary | ICD-10-CM | POA: Diagnosis not present

## 2024-07-28 DIAGNOSIS — R338 Other retention of urine: Secondary | ICD-10-CM | POA: Diagnosis not present

## 2024-07-28 DIAGNOSIS — L89890 Pressure ulcer of other site, unstageable: Secondary | ICD-10-CM | POA: Diagnosis not present

## 2024-07-28 DIAGNOSIS — Z556 Problems related to health literacy: Secondary | ICD-10-CM | POA: Diagnosis not present

## 2024-07-28 DIAGNOSIS — H409 Unspecified glaucoma: Secondary | ICD-10-CM | POA: Diagnosis not present

## 2024-07-28 DIAGNOSIS — N1832 Chronic kidney disease, stage 3b: Secondary | ICD-10-CM | POA: Diagnosis not present

## 2024-07-28 DIAGNOSIS — M6282 Rhabdomyolysis: Secondary | ICD-10-CM | POA: Diagnosis not present

## 2024-07-28 DIAGNOSIS — I129 Hypertensive chronic kidney disease with stage 1 through stage 4 chronic kidney disease, or unspecified chronic kidney disease: Secondary | ICD-10-CM | POA: Diagnosis not present

## 2024-07-28 DIAGNOSIS — E1122 Type 2 diabetes mellitus with diabetic chronic kidney disease: Secondary | ICD-10-CM | POA: Diagnosis not present

## 2024-07-28 DIAGNOSIS — Z7984 Long term (current) use of oral hypoglycemic drugs: Secondary | ICD-10-CM | POA: Diagnosis not present

## 2024-07-28 DIAGNOSIS — Z79890 Hormone replacement therapy (postmenopausal): Secondary | ICD-10-CM | POA: Diagnosis not present

## 2024-07-28 DIAGNOSIS — E291 Testicular hypofunction: Secondary | ICD-10-CM | POA: Diagnosis not present

## 2024-07-28 DIAGNOSIS — Z9181 History of falling: Secondary | ICD-10-CM | POA: Diagnosis not present

## 2024-07-28 DIAGNOSIS — E039 Hypothyroidism, unspecified: Secondary | ICD-10-CM | POA: Diagnosis not present

## 2024-07-28 DIAGNOSIS — Z7982 Long term (current) use of aspirin: Secondary | ICD-10-CM | POA: Diagnosis not present

## 2024-07-28 DIAGNOSIS — R627 Adult failure to thrive: Secondary | ICD-10-CM | POA: Diagnosis not present

## 2024-07-28 DIAGNOSIS — Z960 Presence of urogenital implants: Secondary | ICD-10-CM | POA: Diagnosis not present

## 2024-07-29 ENCOUNTER — Encounter (HOSPITAL_BASED_OUTPATIENT_CLINIC_OR_DEPARTMENT_OTHER): Attending: Internal Medicine | Admitting: Internal Medicine

## 2024-07-29 DIAGNOSIS — L89629 Pressure ulcer of left heel, unspecified stage: Secondary | ICD-10-CM | POA: Insufficient documentation

## 2024-07-29 DIAGNOSIS — E11621 Type 2 diabetes mellitus with foot ulcer: Secondary | ICD-10-CM | POA: Insufficient documentation

## 2024-07-30 DIAGNOSIS — E1122 Type 2 diabetes mellitus with diabetic chronic kidney disease: Secondary | ICD-10-CM | POA: Diagnosis not present

## 2024-07-30 DIAGNOSIS — Z7982 Long term (current) use of aspirin: Secondary | ICD-10-CM | POA: Diagnosis not present

## 2024-07-30 DIAGNOSIS — Z7984 Long term (current) use of oral hypoglycemic drugs: Secondary | ICD-10-CM | POA: Diagnosis not present

## 2024-07-30 DIAGNOSIS — R338 Other retention of urine: Secondary | ICD-10-CM | POA: Diagnosis not present

## 2024-07-30 DIAGNOSIS — N1832 Chronic kidney disease, stage 3b: Secondary | ICD-10-CM | POA: Diagnosis not present

## 2024-07-30 DIAGNOSIS — E291 Testicular hypofunction: Secondary | ICD-10-CM | POA: Diagnosis not present

## 2024-07-30 DIAGNOSIS — H409 Unspecified glaucoma: Secondary | ICD-10-CM | POA: Diagnosis not present

## 2024-07-30 DIAGNOSIS — L89890 Pressure ulcer of other site, unstageable: Secondary | ICD-10-CM | POA: Diagnosis not present

## 2024-07-30 DIAGNOSIS — Z960 Presence of urogenital implants: Secondary | ICD-10-CM | POA: Diagnosis not present

## 2024-07-30 DIAGNOSIS — Z794 Long term (current) use of insulin: Secondary | ICD-10-CM | POA: Diagnosis not present

## 2024-07-30 DIAGNOSIS — R627 Adult failure to thrive: Secondary | ICD-10-CM | POA: Diagnosis not present

## 2024-07-30 DIAGNOSIS — M6282 Rhabdomyolysis: Secondary | ICD-10-CM | POA: Diagnosis not present

## 2024-07-30 DIAGNOSIS — L89620 Pressure ulcer of left heel, unstageable: Secondary | ICD-10-CM | POA: Diagnosis not present

## 2024-07-30 DIAGNOSIS — E039 Hypothyroidism, unspecified: Secondary | ICD-10-CM | POA: Diagnosis not present

## 2024-07-30 DIAGNOSIS — Z79890 Hormone replacement therapy (postmenopausal): Secondary | ICD-10-CM | POA: Diagnosis not present

## 2024-07-30 DIAGNOSIS — Z9181 History of falling: Secondary | ICD-10-CM | POA: Diagnosis not present

## 2024-07-30 DIAGNOSIS — Z556 Problems related to health literacy: Secondary | ICD-10-CM | POA: Diagnosis not present

## 2024-07-30 DIAGNOSIS — I129 Hypertensive chronic kidney disease with stage 1 through stage 4 chronic kidney disease, or unspecified chronic kidney disease: Secondary | ICD-10-CM | POA: Diagnosis not present

## 2024-08-02 DIAGNOSIS — M79671 Pain in right foot: Secondary | ICD-10-CM | POA: Diagnosis not present

## 2024-08-02 DIAGNOSIS — L89890 Pressure ulcer of other site, unstageable: Secondary | ICD-10-CM | POA: Diagnosis not present

## 2024-08-02 DIAGNOSIS — R627 Adult failure to thrive: Secondary | ICD-10-CM | POA: Diagnosis not present

## 2024-08-02 DIAGNOSIS — L600 Ingrowing nail: Secondary | ICD-10-CM | POA: Diagnosis not present

## 2024-08-02 DIAGNOSIS — Z794 Long term (current) use of insulin: Secondary | ICD-10-CM | POA: Diagnosis not present

## 2024-08-02 DIAGNOSIS — Z79890 Hormone replacement therapy (postmenopausal): Secondary | ICD-10-CM | POA: Diagnosis not present

## 2024-08-02 DIAGNOSIS — Z9181 History of falling: Secondary | ICD-10-CM | POA: Diagnosis not present

## 2024-08-02 DIAGNOSIS — Z7984 Long term (current) use of oral hypoglycemic drugs: Secondary | ICD-10-CM | POA: Diagnosis not present

## 2024-08-02 DIAGNOSIS — H409 Unspecified glaucoma: Secondary | ICD-10-CM | POA: Diagnosis not present

## 2024-08-02 DIAGNOSIS — Z960 Presence of urogenital implants: Secondary | ICD-10-CM | POA: Diagnosis not present

## 2024-08-02 DIAGNOSIS — E291 Testicular hypofunction: Secondary | ICD-10-CM | POA: Diagnosis not present

## 2024-08-02 DIAGNOSIS — E039 Hypothyroidism, unspecified: Secondary | ICD-10-CM | POA: Diagnosis not present

## 2024-08-02 DIAGNOSIS — Z7982 Long term (current) use of aspirin: Secondary | ICD-10-CM | POA: Diagnosis not present

## 2024-08-02 DIAGNOSIS — M6282 Rhabdomyolysis: Secondary | ICD-10-CM | POA: Diagnosis not present

## 2024-08-02 DIAGNOSIS — L89620 Pressure ulcer of left heel, unstageable: Secondary | ICD-10-CM | POA: Diagnosis not present

## 2024-08-02 DIAGNOSIS — B351 Tinea unguium: Secondary | ICD-10-CM | POA: Diagnosis not present

## 2024-08-02 DIAGNOSIS — E1122 Type 2 diabetes mellitus with diabetic chronic kidney disease: Secondary | ICD-10-CM | POA: Diagnosis not present

## 2024-08-02 DIAGNOSIS — I739 Peripheral vascular disease, unspecified: Secondary | ICD-10-CM | POA: Diagnosis not present

## 2024-08-02 DIAGNOSIS — R338 Other retention of urine: Secondary | ICD-10-CM | POA: Diagnosis not present

## 2024-08-02 DIAGNOSIS — Z556 Problems related to health literacy: Secondary | ICD-10-CM | POA: Diagnosis not present

## 2024-08-02 DIAGNOSIS — I129 Hypertensive chronic kidney disease with stage 1 through stage 4 chronic kidney disease, or unspecified chronic kidney disease: Secondary | ICD-10-CM | POA: Diagnosis not present

## 2024-08-02 DIAGNOSIS — N1832 Chronic kidney disease, stage 3b: Secondary | ICD-10-CM | POA: Diagnosis not present

## 2024-08-04 DIAGNOSIS — E039 Hypothyroidism, unspecified: Secondary | ICD-10-CM | POA: Diagnosis not present

## 2024-08-04 DIAGNOSIS — Z9181 History of falling: Secondary | ICD-10-CM | POA: Diagnosis not present

## 2024-08-04 DIAGNOSIS — Z79890 Hormone replacement therapy (postmenopausal): Secondary | ICD-10-CM | POA: Diagnosis not present

## 2024-08-04 DIAGNOSIS — I129 Hypertensive chronic kidney disease with stage 1 through stage 4 chronic kidney disease, or unspecified chronic kidney disease: Secondary | ICD-10-CM | POA: Diagnosis not present

## 2024-08-04 DIAGNOSIS — N1832 Chronic kidney disease, stage 3b: Secondary | ICD-10-CM | POA: Diagnosis not present

## 2024-08-04 DIAGNOSIS — R338 Other retention of urine: Secondary | ICD-10-CM | POA: Diagnosis not present

## 2024-08-04 DIAGNOSIS — H409 Unspecified glaucoma: Secondary | ICD-10-CM | POA: Diagnosis not present

## 2024-08-04 DIAGNOSIS — Z7982 Long term (current) use of aspirin: Secondary | ICD-10-CM | POA: Diagnosis not present

## 2024-08-04 DIAGNOSIS — Z794 Long term (current) use of insulin: Secondary | ICD-10-CM | POA: Diagnosis not present

## 2024-08-04 DIAGNOSIS — L89620 Pressure ulcer of left heel, unstageable: Secondary | ICD-10-CM | POA: Diagnosis not present

## 2024-08-04 DIAGNOSIS — R627 Adult failure to thrive: Secondary | ICD-10-CM | POA: Diagnosis not present

## 2024-08-04 DIAGNOSIS — E291 Testicular hypofunction: Secondary | ICD-10-CM | POA: Diagnosis not present

## 2024-08-04 DIAGNOSIS — Z7984 Long term (current) use of oral hypoglycemic drugs: Secondary | ICD-10-CM | POA: Diagnosis not present

## 2024-08-04 DIAGNOSIS — Z556 Problems related to health literacy: Secondary | ICD-10-CM | POA: Diagnosis not present

## 2024-08-04 DIAGNOSIS — L89890 Pressure ulcer of other site, unstageable: Secondary | ICD-10-CM | POA: Diagnosis not present

## 2024-08-04 DIAGNOSIS — Z960 Presence of urogenital implants: Secondary | ICD-10-CM | POA: Diagnosis not present

## 2024-08-04 DIAGNOSIS — M6282 Rhabdomyolysis: Secondary | ICD-10-CM | POA: Diagnosis not present

## 2024-08-04 DIAGNOSIS — E1122 Type 2 diabetes mellitus with diabetic chronic kidney disease: Secondary | ICD-10-CM | POA: Diagnosis not present

## 2024-08-06 DIAGNOSIS — H409 Unspecified glaucoma: Secondary | ICD-10-CM | POA: Diagnosis not present

## 2024-08-06 DIAGNOSIS — N1832 Chronic kidney disease, stage 3b: Secondary | ICD-10-CM | POA: Diagnosis not present

## 2024-08-06 DIAGNOSIS — Z7984 Long term (current) use of oral hypoglycemic drugs: Secondary | ICD-10-CM | POA: Diagnosis not present

## 2024-08-06 DIAGNOSIS — Z556 Problems related to health literacy: Secondary | ICD-10-CM | POA: Diagnosis not present

## 2024-08-06 DIAGNOSIS — R627 Adult failure to thrive: Secondary | ICD-10-CM | POA: Diagnosis not present

## 2024-08-06 DIAGNOSIS — I129 Hypertensive chronic kidney disease with stage 1 through stage 4 chronic kidney disease, or unspecified chronic kidney disease: Secondary | ICD-10-CM | POA: Diagnosis not present

## 2024-08-06 DIAGNOSIS — Z960 Presence of urogenital implants: Secondary | ICD-10-CM | POA: Diagnosis not present

## 2024-08-06 DIAGNOSIS — E291 Testicular hypofunction: Secondary | ICD-10-CM | POA: Diagnosis not present

## 2024-08-06 DIAGNOSIS — Z9181 History of falling: Secondary | ICD-10-CM | POA: Diagnosis not present

## 2024-08-06 DIAGNOSIS — Z794 Long term (current) use of insulin: Secondary | ICD-10-CM | POA: Diagnosis not present

## 2024-08-06 DIAGNOSIS — L89620 Pressure ulcer of left heel, unstageable: Secondary | ICD-10-CM | POA: Diagnosis not present

## 2024-08-06 DIAGNOSIS — Z79890 Hormone replacement therapy (postmenopausal): Secondary | ICD-10-CM | POA: Diagnosis not present

## 2024-08-06 DIAGNOSIS — E1122 Type 2 diabetes mellitus with diabetic chronic kidney disease: Secondary | ICD-10-CM | POA: Diagnosis not present

## 2024-08-06 DIAGNOSIS — L89890 Pressure ulcer of other site, unstageable: Secondary | ICD-10-CM | POA: Diagnosis not present

## 2024-08-06 DIAGNOSIS — R338 Other retention of urine: Secondary | ICD-10-CM | POA: Diagnosis not present

## 2024-08-06 DIAGNOSIS — M6282 Rhabdomyolysis: Secondary | ICD-10-CM | POA: Diagnosis not present

## 2024-08-06 DIAGNOSIS — Z7982 Long term (current) use of aspirin: Secondary | ICD-10-CM | POA: Diagnosis not present

## 2024-08-06 DIAGNOSIS — E039 Hypothyroidism, unspecified: Secondary | ICD-10-CM | POA: Diagnosis not present

## 2024-08-09 DIAGNOSIS — Z7982 Long term (current) use of aspirin: Secondary | ICD-10-CM | POA: Diagnosis not present

## 2024-08-09 DIAGNOSIS — Z9181 History of falling: Secondary | ICD-10-CM | POA: Diagnosis not present

## 2024-08-09 DIAGNOSIS — R627 Adult failure to thrive: Secondary | ICD-10-CM | POA: Diagnosis not present

## 2024-08-09 DIAGNOSIS — E1122 Type 2 diabetes mellitus with diabetic chronic kidney disease: Secondary | ICD-10-CM | POA: Diagnosis not present

## 2024-08-09 DIAGNOSIS — E039 Hypothyroidism, unspecified: Secondary | ICD-10-CM | POA: Diagnosis not present

## 2024-08-09 DIAGNOSIS — H409 Unspecified glaucoma: Secondary | ICD-10-CM | POA: Diagnosis not present

## 2024-08-09 DIAGNOSIS — Z960 Presence of urogenital implants: Secondary | ICD-10-CM | POA: Diagnosis not present

## 2024-08-09 DIAGNOSIS — Z556 Problems related to health literacy: Secondary | ICD-10-CM | POA: Diagnosis not present

## 2024-08-09 DIAGNOSIS — Z7984 Long term (current) use of oral hypoglycemic drugs: Secondary | ICD-10-CM | POA: Diagnosis not present

## 2024-08-09 DIAGNOSIS — E291 Testicular hypofunction: Secondary | ICD-10-CM | POA: Diagnosis not present

## 2024-08-09 DIAGNOSIS — L89620 Pressure ulcer of left heel, unstageable: Secondary | ICD-10-CM | POA: Diagnosis not present

## 2024-08-09 DIAGNOSIS — R338 Other retention of urine: Secondary | ICD-10-CM | POA: Diagnosis not present

## 2024-08-09 DIAGNOSIS — M6282 Rhabdomyolysis: Secondary | ICD-10-CM | POA: Diagnosis not present

## 2024-08-09 DIAGNOSIS — I129 Hypertensive chronic kidney disease with stage 1 through stage 4 chronic kidney disease, or unspecified chronic kidney disease: Secondary | ICD-10-CM | POA: Diagnosis not present

## 2024-08-09 DIAGNOSIS — Z79890 Hormone replacement therapy (postmenopausal): Secondary | ICD-10-CM | POA: Diagnosis not present

## 2024-08-09 DIAGNOSIS — N1832 Chronic kidney disease, stage 3b: Secondary | ICD-10-CM | POA: Diagnosis not present

## 2024-08-09 DIAGNOSIS — L89890 Pressure ulcer of other site, unstageable: Secondary | ICD-10-CM | POA: Diagnosis not present

## 2024-08-09 DIAGNOSIS — Z794 Long term (current) use of insulin: Secondary | ICD-10-CM | POA: Diagnosis not present

## 2024-08-10 DIAGNOSIS — E785 Hyperlipidemia, unspecified: Secondary | ICD-10-CM | POA: Diagnosis not present

## 2024-08-10 DIAGNOSIS — Z1329 Encounter for screening for other suspected endocrine disorder: Secondary | ICD-10-CM | POA: Diagnosis not present

## 2024-08-10 DIAGNOSIS — Z131 Encounter for screening for diabetes mellitus: Secondary | ICD-10-CM | POA: Diagnosis not present

## 2024-08-11 DIAGNOSIS — Z9181 History of falling: Secondary | ICD-10-CM | POA: Diagnosis not present

## 2024-08-11 DIAGNOSIS — Z794 Long term (current) use of insulin: Secondary | ICD-10-CM | POA: Diagnosis not present

## 2024-08-11 DIAGNOSIS — R338 Other retention of urine: Secondary | ICD-10-CM | POA: Diagnosis not present

## 2024-08-11 DIAGNOSIS — Z7982 Long term (current) use of aspirin: Secondary | ICD-10-CM | POA: Diagnosis not present

## 2024-08-11 DIAGNOSIS — N401 Enlarged prostate with lower urinary tract symptoms: Secondary | ICD-10-CM | POA: Diagnosis not present

## 2024-08-11 DIAGNOSIS — N1832 Chronic kidney disease, stage 3b: Secondary | ICD-10-CM | POA: Diagnosis not present

## 2024-08-11 DIAGNOSIS — E7800 Pure hypercholesterolemia, unspecified: Secondary | ICD-10-CM | POA: Diagnosis not present

## 2024-08-11 DIAGNOSIS — Z87440 Personal history of urinary (tract) infections: Secondary | ICD-10-CM | POA: Diagnosis not present

## 2024-08-11 DIAGNOSIS — Z8673 Personal history of transient ischemic attack (TIA), and cerebral infarction without residual deficits: Secondary | ICD-10-CM | POA: Diagnosis not present

## 2024-08-11 DIAGNOSIS — L89620 Pressure ulcer of left heel, unstageable: Secondary | ICD-10-CM | POA: Diagnosis not present

## 2024-08-11 DIAGNOSIS — H409 Unspecified glaucoma: Secondary | ICD-10-CM | POA: Diagnosis not present

## 2024-08-11 DIAGNOSIS — E039 Hypothyroidism, unspecified: Secondary | ICD-10-CM | POA: Diagnosis not present

## 2024-08-11 DIAGNOSIS — E1122 Type 2 diabetes mellitus with diabetic chronic kidney disease: Secondary | ICD-10-CM | POA: Diagnosis not present

## 2024-08-11 DIAGNOSIS — Z960 Presence of urogenital implants: Secondary | ICD-10-CM | POA: Diagnosis not present

## 2024-08-11 DIAGNOSIS — Z556 Problems related to health literacy: Secondary | ICD-10-CM | POA: Diagnosis not present

## 2024-08-11 DIAGNOSIS — D631 Anemia in chronic kidney disease: Secondary | ICD-10-CM | POA: Diagnosis not present

## 2024-08-11 DIAGNOSIS — Z7984 Long term (current) use of oral hypoglycemic drugs: Secondary | ICD-10-CM | POA: Diagnosis not present

## 2024-08-11 DIAGNOSIS — I129 Hypertensive chronic kidney disease with stage 1 through stage 4 chronic kidney disease, or unspecified chronic kidney disease: Secondary | ICD-10-CM | POA: Diagnosis not present

## 2024-08-11 DIAGNOSIS — E441 Mild protein-calorie malnutrition: Secondary | ICD-10-CM | POA: Diagnosis not present

## 2024-08-12 ENCOUNTER — Encounter (HOSPITAL_BASED_OUTPATIENT_CLINIC_OR_DEPARTMENT_OTHER): Admitting: Internal Medicine

## 2024-08-13 DIAGNOSIS — Z794 Long term (current) use of insulin: Secondary | ICD-10-CM | POA: Diagnosis not present

## 2024-08-13 DIAGNOSIS — E039 Hypothyroidism, unspecified: Secondary | ICD-10-CM | POA: Diagnosis not present

## 2024-08-13 DIAGNOSIS — Z87440 Personal history of urinary (tract) infections: Secondary | ICD-10-CM | POA: Diagnosis not present

## 2024-08-13 DIAGNOSIS — L89620 Pressure ulcer of left heel, unstageable: Secondary | ICD-10-CM | POA: Diagnosis not present

## 2024-08-13 DIAGNOSIS — N401 Enlarged prostate with lower urinary tract symptoms: Secondary | ICD-10-CM | POA: Diagnosis not present

## 2024-08-13 DIAGNOSIS — I129 Hypertensive chronic kidney disease with stage 1 through stage 4 chronic kidney disease, or unspecified chronic kidney disease: Secondary | ICD-10-CM | POA: Diagnosis not present

## 2024-08-13 DIAGNOSIS — E441 Mild protein-calorie malnutrition: Secondary | ICD-10-CM | POA: Diagnosis not present

## 2024-08-13 DIAGNOSIS — E1122 Type 2 diabetes mellitus with diabetic chronic kidney disease: Secondary | ICD-10-CM | POA: Diagnosis not present

## 2024-08-13 DIAGNOSIS — Z9181 History of falling: Secondary | ICD-10-CM | POA: Diagnosis not present

## 2024-08-13 DIAGNOSIS — D631 Anemia in chronic kidney disease: Secondary | ICD-10-CM | POA: Diagnosis not present

## 2024-08-13 DIAGNOSIS — R338 Other retention of urine: Secondary | ICD-10-CM | POA: Diagnosis not present

## 2024-08-13 DIAGNOSIS — Z7982 Long term (current) use of aspirin: Secondary | ICD-10-CM | POA: Diagnosis not present

## 2024-08-13 DIAGNOSIS — Z960 Presence of urogenital implants: Secondary | ICD-10-CM | POA: Diagnosis not present

## 2024-08-13 DIAGNOSIS — Z8673 Personal history of transient ischemic attack (TIA), and cerebral infarction without residual deficits: Secondary | ICD-10-CM | POA: Diagnosis not present

## 2024-08-13 DIAGNOSIS — Z7984 Long term (current) use of oral hypoglycemic drugs: Secondary | ICD-10-CM | POA: Diagnosis not present

## 2024-08-13 DIAGNOSIS — N1832 Chronic kidney disease, stage 3b: Secondary | ICD-10-CM | POA: Diagnosis not present

## 2024-08-13 DIAGNOSIS — Z556 Problems related to health literacy: Secondary | ICD-10-CM | POA: Diagnosis not present

## 2024-08-13 DIAGNOSIS — E7800 Pure hypercholesterolemia, unspecified: Secondary | ICD-10-CM | POA: Diagnosis not present

## 2024-08-13 DIAGNOSIS — H409 Unspecified glaucoma: Secondary | ICD-10-CM | POA: Diagnosis not present

## 2024-08-16 DIAGNOSIS — E441 Mild protein-calorie malnutrition: Secondary | ICD-10-CM | POA: Diagnosis not present

## 2024-08-16 DIAGNOSIS — N401 Enlarged prostate with lower urinary tract symptoms: Secondary | ICD-10-CM | POA: Diagnosis not present

## 2024-08-16 DIAGNOSIS — Z7984 Long term (current) use of oral hypoglycemic drugs: Secondary | ICD-10-CM | POA: Diagnosis not present

## 2024-08-16 DIAGNOSIS — Z87440 Personal history of urinary (tract) infections: Secondary | ICD-10-CM | POA: Diagnosis not present

## 2024-08-16 DIAGNOSIS — Z794 Long term (current) use of insulin: Secondary | ICD-10-CM | POA: Diagnosis not present

## 2024-08-16 DIAGNOSIS — Z9181 History of falling: Secondary | ICD-10-CM | POA: Diagnosis not present

## 2024-08-16 DIAGNOSIS — E1122 Type 2 diabetes mellitus with diabetic chronic kidney disease: Secondary | ICD-10-CM | POA: Diagnosis not present

## 2024-08-16 DIAGNOSIS — I129 Hypertensive chronic kidney disease with stage 1 through stage 4 chronic kidney disease, or unspecified chronic kidney disease: Secondary | ICD-10-CM | POA: Diagnosis not present

## 2024-08-16 DIAGNOSIS — N1832 Chronic kidney disease, stage 3b: Secondary | ICD-10-CM | POA: Diagnosis not present

## 2024-08-16 DIAGNOSIS — E7800 Pure hypercholesterolemia, unspecified: Secondary | ICD-10-CM | POA: Diagnosis not present

## 2024-08-16 DIAGNOSIS — E039 Hypothyroidism, unspecified: Secondary | ICD-10-CM | POA: Diagnosis not present

## 2024-08-16 DIAGNOSIS — Z8673 Personal history of transient ischemic attack (TIA), and cerebral infarction without residual deficits: Secondary | ICD-10-CM | POA: Diagnosis not present

## 2024-08-16 DIAGNOSIS — Z7982 Long term (current) use of aspirin: Secondary | ICD-10-CM | POA: Diagnosis not present

## 2024-08-16 DIAGNOSIS — H409 Unspecified glaucoma: Secondary | ICD-10-CM | POA: Diagnosis not present

## 2024-08-16 DIAGNOSIS — Z556 Problems related to health literacy: Secondary | ICD-10-CM | POA: Diagnosis not present

## 2024-08-16 DIAGNOSIS — Z960 Presence of urogenital implants: Secondary | ICD-10-CM | POA: Diagnosis not present

## 2024-08-16 DIAGNOSIS — N184 Chronic kidney disease, stage 4 (severe): Secondary | ICD-10-CM | POA: Diagnosis not present

## 2024-08-16 DIAGNOSIS — L89620 Pressure ulcer of left heel, unstageable: Secondary | ICD-10-CM | POA: Diagnosis not present

## 2024-08-16 DIAGNOSIS — D631 Anemia in chronic kidney disease: Secondary | ICD-10-CM | POA: Diagnosis not present

## 2024-08-16 DIAGNOSIS — R338 Other retention of urine: Secondary | ICD-10-CM | POA: Diagnosis not present

## 2024-08-17 ENCOUNTER — Encounter (HOSPITAL_BASED_OUTPATIENT_CLINIC_OR_DEPARTMENT_OTHER): Admitting: Internal Medicine

## 2024-08-17 DIAGNOSIS — L89629 Pressure ulcer of left heel, unspecified stage: Secondary | ICD-10-CM | POA: Diagnosis not present

## 2024-08-17 DIAGNOSIS — E11621 Type 2 diabetes mellitus with foot ulcer: Secondary | ICD-10-CM | POA: Diagnosis not present

## 2024-08-18 DIAGNOSIS — R338 Other retention of urine: Secondary | ICD-10-CM | POA: Diagnosis not present

## 2024-08-18 DIAGNOSIS — H409 Unspecified glaucoma: Secondary | ICD-10-CM | POA: Diagnosis not present

## 2024-08-18 DIAGNOSIS — I129 Hypertensive chronic kidney disease with stage 1 through stage 4 chronic kidney disease, or unspecified chronic kidney disease: Secondary | ICD-10-CM | POA: Diagnosis not present

## 2024-08-18 DIAGNOSIS — Z87440 Personal history of urinary (tract) infections: Secondary | ICD-10-CM | POA: Diagnosis not present

## 2024-08-18 DIAGNOSIS — Z7982 Long term (current) use of aspirin: Secondary | ICD-10-CM | POA: Diagnosis not present

## 2024-08-18 DIAGNOSIS — N401 Enlarged prostate with lower urinary tract symptoms: Secondary | ICD-10-CM | POA: Diagnosis not present

## 2024-08-18 DIAGNOSIS — Z8673 Personal history of transient ischemic attack (TIA), and cerebral infarction without residual deficits: Secondary | ICD-10-CM | POA: Diagnosis not present

## 2024-08-18 DIAGNOSIS — E039 Hypothyroidism, unspecified: Secondary | ICD-10-CM | POA: Diagnosis not present

## 2024-08-18 DIAGNOSIS — N1832 Chronic kidney disease, stage 3b: Secondary | ICD-10-CM | POA: Diagnosis not present

## 2024-08-18 DIAGNOSIS — L89620 Pressure ulcer of left heel, unstageable: Secondary | ICD-10-CM | POA: Diagnosis not present

## 2024-08-18 DIAGNOSIS — E7800 Pure hypercholesterolemia, unspecified: Secondary | ICD-10-CM | POA: Diagnosis not present

## 2024-08-18 DIAGNOSIS — Z556 Problems related to health literacy: Secondary | ICD-10-CM | POA: Diagnosis not present

## 2024-08-18 DIAGNOSIS — D631 Anemia in chronic kidney disease: Secondary | ICD-10-CM | POA: Diagnosis not present

## 2024-08-18 DIAGNOSIS — Z960 Presence of urogenital implants: Secondary | ICD-10-CM | POA: Diagnosis not present

## 2024-08-18 DIAGNOSIS — Z7984 Long term (current) use of oral hypoglycemic drugs: Secondary | ICD-10-CM | POA: Diagnosis not present

## 2024-08-18 DIAGNOSIS — E441 Mild protein-calorie malnutrition: Secondary | ICD-10-CM | POA: Diagnosis not present

## 2024-08-18 DIAGNOSIS — Z9181 History of falling: Secondary | ICD-10-CM | POA: Diagnosis not present

## 2024-08-18 DIAGNOSIS — Z794 Long term (current) use of insulin: Secondary | ICD-10-CM | POA: Diagnosis not present

## 2024-08-18 DIAGNOSIS — E1122 Type 2 diabetes mellitus with diabetic chronic kidney disease: Secondary | ICD-10-CM | POA: Diagnosis not present

## 2024-08-20 DIAGNOSIS — N401 Enlarged prostate with lower urinary tract symptoms: Secondary | ICD-10-CM | POA: Diagnosis not present

## 2024-08-20 DIAGNOSIS — R338 Other retention of urine: Secondary | ICD-10-CM | POA: Diagnosis not present

## 2024-08-20 DIAGNOSIS — H409 Unspecified glaucoma: Secondary | ICD-10-CM | POA: Diagnosis not present

## 2024-08-20 DIAGNOSIS — Z9181 History of falling: Secondary | ICD-10-CM | POA: Diagnosis not present

## 2024-08-20 DIAGNOSIS — Z87440 Personal history of urinary (tract) infections: Secondary | ICD-10-CM | POA: Diagnosis not present

## 2024-08-20 DIAGNOSIS — Z960 Presence of urogenital implants: Secondary | ICD-10-CM | POA: Diagnosis not present

## 2024-08-20 DIAGNOSIS — Z7984 Long term (current) use of oral hypoglycemic drugs: Secondary | ICD-10-CM | POA: Diagnosis not present

## 2024-08-20 DIAGNOSIS — Z794 Long term (current) use of insulin: Secondary | ICD-10-CM | POA: Diagnosis not present

## 2024-08-20 DIAGNOSIS — Z7982 Long term (current) use of aspirin: Secondary | ICD-10-CM | POA: Diagnosis not present

## 2024-08-20 DIAGNOSIS — L89620 Pressure ulcer of left heel, unstageable: Secondary | ICD-10-CM | POA: Diagnosis not present

## 2024-08-20 DIAGNOSIS — Z556 Problems related to health literacy: Secondary | ICD-10-CM | POA: Diagnosis not present

## 2024-08-20 DIAGNOSIS — E441 Mild protein-calorie malnutrition: Secondary | ICD-10-CM | POA: Diagnosis not present

## 2024-08-20 DIAGNOSIS — E1122 Type 2 diabetes mellitus with diabetic chronic kidney disease: Secondary | ICD-10-CM | POA: Diagnosis not present

## 2024-08-20 DIAGNOSIS — E7800 Pure hypercholesterolemia, unspecified: Secondary | ICD-10-CM | POA: Diagnosis not present

## 2024-08-20 DIAGNOSIS — I129 Hypertensive chronic kidney disease with stage 1 through stage 4 chronic kidney disease, or unspecified chronic kidney disease: Secondary | ICD-10-CM | POA: Diagnosis not present

## 2024-08-20 DIAGNOSIS — E039 Hypothyroidism, unspecified: Secondary | ICD-10-CM | POA: Diagnosis not present

## 2024-08-20 DIAGNOSIS — N1832 Chronic kidney disease, stage 3b: Secondary | ICD-10-CM | POA: Diagnosis not present

## 2024-08-20 DIAGNOSIS — Z8673 Personal history of transient ischemic attack (TIA), and cerebral infarction without residual deficits: Secondary | ICD-10-CM | POA: Diagnosis not present

## 2024-08-20 DIAGNOSIS — D631 Anemia in chronic kidney disease: Secondary | ICD-10-CM | POA: Diagnosis not present

## 2024-08-23 DIAGNOSIS — Z7984 Long term (current) use of oral hypoglycemic drugs: Secondary | ICD-10-CM | POA: Diagnosis not present

## 2024-08-23 DIAGNOSIS — E7800 Pure hypercholesterolemia, unspecified: Secondary | ICD-10-CM | POA: Diagnosis not present

## 2024-08-23 DIAGNOSIS — E1122 Type 2 diabetes mellitus with diabetic chronic kidney disease: Secondary | ICD-10-CM | POA: Diagnosis not present

## 2024-08-23 DIAGNOSIS — N401 Enlarged prostate with lower urinary tract symptoms: Secondary | ICD-10-CM | POA: Diagnosis not present

## 2024-08-23 DIAGNOSIS — Z7982 Long term (current) use of aspirin: Secondary | ICD-10-CM | POA: Diagnosis not present

## 2024-08-23 DIAGNOSIS — Z9181 History of falling: Secondary | ICD-10-CM | POA: Diagnosis not present

## 2024-08-23 DIAGNOSIS — E039 Hypothyroidism, unspecified: Secondary | ICD-10-CM | POA: Diagnosis not present

## 2024-08-23 DIAGNOSIS — Z556 Problems related to health literacy: Secondary | ICD-10-CM | POA: Diagnosis not present

## 2024-08-23 DIAGNOSIS — Z87440 Personal history of urinary (tract) infections: Secondary | ICD-10-CM | POA: Diagnosis not present

## 2024-08-23 DIAGNOSIS — I129 Hypertensive chronic kidney disease with stage 1 through stage 4 chronic kidney disease, or unspecified chronic kidney disease: Secondary | ICD-10-CM | POA: Diagnosis not present

## 2024-08-23 DIAGNOSIS — Z8673 Personal history of transient ischemic attack (TIA), and cerebral infarction without residual deficits: Secondary | ICD-10-CM | POA: Diagnosis not present

## 2024-08-23 DIAGNOSIS — D631 Anemia in chronic kidney disease: Secondary | ICD-10-CM | POA: Diagnosis not present

## 2024-08-23 DIAGNOSIS — N1832 Chronic kidney disease, stage 3b: Secondary | ICD-10-CM | POA: Diagnosis not present

## 2024-08-23 DIAGNOSIS — E441 Mild protein-calorie malnutrition: Secondary | ICD-10-CM | POA: Diagnosis not present

## 2024-08-23 DIAGNOSIS — Z794 Long term (current) use of insulin: Secondary | ICD-10-CM | POA: Diagnosis not present

## 2024-08-23 DIAGNOSIS — R338 Other retention of urine: Secondary | ICD-10-CM | POA: Diagnosis not present

## 2024-08-23 DIAGNOSIS — Z960 Presence of urogenital implants: Secondary | ICD-10-CM | POA: Diagnosis not present

## 2024-08-23 DIAGNOSIS — H409 Unspecified glaucoma: Secondary | ICD-10-CM | POA: Diagnosis not present

## 2024-08-23 DIAGNOSIS — L89620 Pressure ulcer of left heel, unstageable: Secondary | ICD-10-CM | POA: Diagnosis not present

## 2024-08-24 DIAGNOSIS — N1832 Chronic kidney disease, stage 3b: Secondary | ICD-10-CM | POA: Diagnosis not present

## 2024-08-24 DIAGNOSIS — Z7984 Long term (current) use of oral hypoglycemic drugs: Secondary | ICD-10-CM | POA: Diagnosis not present

## 2024-08-24 DIAGNOSIS — E1122 Type 2 diabetes mellitus with diabetic chronic kidney disease: Secondary | ICD-10-CM | POA: Diagnosis not present

## 2024-08-24 DIAGNOSIS — Z794 Long term (current) use of insulin: Secondary | ICD-10-CM | POA: Diagnosis not present

## 2024-08-25 DIAGNOSIS — Z556 Problems related to health literacy: Secondary | ICD-10-CM | POA: Diagnosis not present

## 2024-08-25 DIAGNOSIS — E1122 Type 2 diabetes mellitus with diabetic chronic kidney disease: Secondary | ICD-10-CM | POA: Diagnosis not present

## 2024-08-25 DIAGNOSIS — D631 Anemia in chronic kidney disease: Secondary | ICD-10-CM | POA: Diagnosis not present

## 2024-08-25 DIAGNOSIS — Z7984 Long term (current) use of oral hypoglycemic drugs: Secondary | ICD-10-CM | POA: Diagnosis not present

## 2024-08-25 DIAGNOSIS — N1832 Chronic kidney disease, stage 3b: Secondary | ICD-10-CM | POA: Diagnosis not present

## 2024-08-25 DIAGNOSIS — E441 Mild protein-calorie malnutrition: Secondary | ICD-10-CM | POA: Diagnosis not present

## 2024-08-25 DIAGNOSIS — Z9181 History of falling: Secondary | ICD-10-CM | POA: Diagnosis not present

## 2024-08-25 DIAGNOSIS — N401 Enlarged prostate with lower urinary tract symptoms: Secondary | ICD-10-CM | POA: Diagnosis not present

## 2024-08-25 DIAGNOSIS — Z794 Long term (current) use of insulin: Secondary | ICD-10-CM | POA: Diagnosis not present

## 2024-08-25 DIAGNOSIS — E7800 Pure hypercholesterolemia, unspecified: Secondary | ICD-10-CM | POA: Diagnosis not present

## 2024-08-25 DIAGNOSIS — I129 Hypertensive chronic kidney disease with stage 1 through stage 4 chronic kidney disease, or unspecified chronic kidney disease: Secondary | ICD-10-CM | POA: Diagnosis not present

## 2024-08-25 DIAGNOSIS — E039 Hypothyroidism, unspecified: Secondary | ICD-10-CM | POA: Diagnosis not present

## 2024-08-25 DIAGNOSIS — H409 Unspecified glaucoma: Secondary | ICD-10-CM | POA: Diagnosis not present

## 2024-08-25 DIAGNOSIS — Z8673 Personal history of transient ischemic attack (TIA), and cerebral infarction without residual deficits: Secondary | ICD-10-CM | POA: Diagnosis not present

## 2024-08-25 DIAGNOSIS — Z960 Presence of urogenital implants: Secondary | ICD-10-CM | POA: Diagnosis not present

## 2024-08-25 DIAGNOSIS — Z7982 Long term (current) use of aspirin: Secondary | ICD-10-CM | POA: Diagnosis not present

## 2024-08-25 DIAGNOSIS — L89620 Pressure ulcer of left heel, unstageable: Secondary | ICD-10-CM | POA: Diagnosis not present

## 2024-08-25 DIAGNOSIS — Z87440 Personal history of urinary (tract) infections: Secondary | ICD-10-CM | POA: Diagnosis not present

## 2024-08-25 DIAGNOSIS — R338 Other retention of urine: Secondary | ICD-10-CM | POA: Diagnosis not present

## 2024-08-26 DIAGNOSIS — L89620 Pressure ulcer of left heel, unstageable: Secondary | ICD-10-CM | POA: Diagnosis not present

## 2024-08-26 DIAGNOSIS — Z8673 Personal history of transient ischemic attack (TIA), and cerebral infarction without residual deficits: Secondary | ICD-10-CM | POA: Diagnosis not present

## 2024-08-26 DIAGNOSIS — E1122 Type 2 diabetes mellitus with diabetic chronic kidney disease: Secondary | ICD-10-CM | POA: Diagnosis not present

## 2024-08-26 DIAGNOSIS — N401 Enlarged prostate with lower urinary tract symptoms: Secondary | ICD-10-CM | POA: Diagnosis not present

## 2024-08-26 DIAGNOSIS — I129 Hypertensive chronic kidney disease with stage 1 through stage 4 chronic kidney disease, or unspecified chronic kidney disease: Secondary | ICD-10-CM | POA: Diagnosis not present

## 2024-08-26 DIAGNOSIS — R338 Other retention of urine: Secondary | ICD-10-CM | POA: Diagnosis not present

## 2024-08-26 DIAGNOSIS — Z87440 Personal history of urinary (tract) infections: Secondary | ICD-10-CM | POA: Diagnosis not present

## 2024-08-26 DIAGNOSIS — E441 Mild protein-calorie malnutrition: Secondary | ICD-10-CM | POA: Diagnosis not present

## 2024-08-26 DIAGNOSIS — Z556 Problems related to health literacy: Secondary | ICD-10-CM | POA: Diagnosis not present

## 2024-08-26 DIAGNOSIS — E039 Hypothyroidism, unspecified: Secondary | ICD-10-CM | POA: Diagnosis not present

## 2024-08-26 DIAGNOSIS — D631 Anemia in chronic kidney disease: Secondary | ICD-10-CM | POA: Diagnosis not present

## 2024-08-26 DIAGNOSIS — Z7984 Long term (current) use of oral hypoglycemic drugs: Secondary | ICD-10-CM | POA: Diagnosis not present

## 2024-08-26 DIAGNOSIS — Z9181 History of falling: Secondary | ICD-10-CM | POA: Diagnosis not present

## 2024-08-26 DIAGNOSIS — Z794 Long term (current) use of insulin: Secondary | ICD-10-CM | POA: Diagnosis not present

## 2024-08-26 DIAGNOSIS — N1832 Chronic kidney disease, stage 3b: Secondary | ICD-10-CM | POA: Diagnosis not present

## 2024-08-26 DIAGNOSIS — E7800 Pure hypercholesterolemia, unspecified: Secondary | ICD-10-CM | POA: Diagnosis not present

## 2024-08-26 DIAGNOSIS — Z960 Presence of urogenital implants: Secondary | ICD-10-CM | POA: Diagnosis not present

## 2024-08-26 DIAGNOSIS — H409 Unspecified glaucoma: Secondary | ICD-10-CM | POA: Diagnosis not present

## 2024-08-26 DIAGNOSIS — Z7982 Long term (current) use of aspirin: Secondary | ICD-10-CM | POA: Diagnosis not present

## 2024-08-30 DIAGNOSIS — E039 Hypothyroidism, unspecified: Secondary | ICD-10-CM | POA: Diagnosis not present

## 2024-08-30 DIAGNOSIS — L89620 Pressure ulcer of left heel, unstageable: Secondary | ICD-10-CM | POA: Diagnosis not present

## 2024-08-30 DIAGNOSIS — Z9181 History of falling: Secondary | ICD-10-CM | POA: Diagnosis not present

## 2024-08-30 DIAGNOSIS — Z556 Problems related to health literacy: Secondary | ICD-10-CM | POA: Diagnosis not present

## 2024-08-30 DIAGNOSIS — E7800 Pure hypercholesterolemia, unspecified: Secondary | ICD-10-CM | POA: Diagnosis not present

## 2024-08-30 DIAGNOSIS — H409 Unspecified glaucoma: Secondary | ICD-10-CM | POA: Diagnosis not present

## 2024-08-30 DIAGNOSIS — Z794 Long term (current) use of insulin: Secondary | ICD-10-CM | POA: Diagnosis not present

## 2024-08-30 DIAGNOSIS — N1832 Chronic kidney disease, stage 3b: Secondary | ICD-10-CM | POA: Diagnosis not present

## 2024-08-30 DIAGNOSIS — D631 Anemia in chronic kidney disease: Secondary | ICD-10-CM | POA: Diagnosis not present

## 2024-08-30 DIAGNOSIS — E441 Mild protein-calorie malnutrition: Secondary | ICD-10-CM | POA: Diagnosis not present

## 2024-08-30 DIAGNOSIS — R338 Other retention of urine: Secondary | ICD-10-CM | POA: Diagnosis not present

## 2024-08-30 DIAGNOSIS — Z8673 Personal history of transient ischemic attack (TIA), and cerebral infarction without residual deficits: Secondary | ICD-10-CM | POA: Diagnosis not present

## 2024-08-30 DIAGNOSIS — Z7984 Long term (current) use of oral hypoglycemic drugs: Secondary | ICD-10-CM | POA: Diagnosis not present

## 2024-08-30 DIAGNOSIS — I129 Hypertensive chronic kidney disease with stage 1 through stage 4 chronic kidney disease, or unspecified chronic kidney disease: Secondary | ICD-10-CM | POA: Diagnosis not present

## 2024-08-30 DIAGNOSIS — Z960 Presence of urogenital implants: Secondary | ICD-10-CM | POA: Diagnosis not present

## 2024-08-30 DIAGNOSIS — N401 Enlarged prostate with lower urinary tract symptoms: Secondary | ICD-10-CM | POA: Diagnosis not present

## 2024-08-30 DIAGNOSIS — Z7982 Long term (current) use of aspirin: Secondary | ICD-10-CM | POA: Diagnosis not present

## 2024-08-30 DIAGNOSIS — Z87440 Personal history of urinary (tract) infections: Secondary | ICD-10-CM | POA: Diagnosis not present

## 2024-08-30 DIAGNOSIS — E1122 Type 2 diabetes mellitus with diabetic chronic kidney disease: Secondary | ICD-10-CM | POA: Diagnosis not present

## 2024-08-31 ENCOUNTER — Encounter (HOSPITAL_BASED_OUTPATIENT_CLINIC_OR_DEPARTMENT_OTHER): Admitting: Internal Medicine

## 2024-09-01 ENCOUNTER — Encounter (HOSPITAL_BASED_OUTPATIENT_CLINIC_OR_DEPARTMENT_OTHER): Attending: Internal Medicine | Admitting: Internal Medicine

## 2024-09-01 DIAGNOSIS — L89629 Pressure ulcer of left heel, unspecified stage: Secondary | ICD-10-CM | POA: Diagnosis not present

## 2024-09-01 DIAGNOSIS — L89620 Pressure ulcer of left heel, unstageable: Secondary | ICD-10-CM | POA: Diagnosis not present

## 2024-09-01 DIAGNOSIS — E11621 Type 2 diabetes mellitus with foot ulcer: Secondary | ICD-10-CM | POA: Diagnosis not present

## 2024-09-01 DIAGNOSIS — E1122 Type 2 diabetes mellitus with diabetic chronic kidney disease: Secondary | ICD-10-CM | POA: Diagnosis not present

## 2024-09-01 DIAGNOSIS — I129 Hypertensive chronic kidney disease with stage 1 through stage 4 chronic kidney disease, or unspecified chronic kidney disease: Secondary | ICD-10-CM | POA: Diagnosis not present

## 2024-09-01 DIAGNOSIS — N184 Chronic kidney disease, stage 4 (severe): Secondary | ICD-10-CM | POA: Diagnosis not present

## 2024-09-02 ENCOUNTER — Encounter (HOSPITAL_BASED_OUTPATIENT_CLINIC_OR_DEPARTMENT_OTHER): Admitting: Internal Medicine

## 2024-09-02 DIAGNOSIS — H353211 Exudative age-related macular degeneration, right eye, with active choroidal neovascularization: Secondary | ICD-10-CM | POA: Diagnosis not present

## 2024-09-03 DIAGNOSIS — E441 Mild protein-calorie malnutrition: Secondary | ICD-10-CM | POA: Diagnosis not present

## 2024-09-03 DIAGNOSIS — Z8673 Personal history of transient ischemic attack (TIA), and cerebral infarction without residual deficits: Secondary | ICD-10-CM | POA: Diagnosis not present

## 2024-09-03 DIAGNOSIS — H409 Unspecified glaucoma: Secondary | ICD-10-CM | POA: Diagnosis not present

## 2024-09-03 DIAGNOSIS — L89620 Pressure ulcer of left heel, unstageable: Secondary | ICD-10-CM | POA: Diagnosis not present

## 2024-09-03 DIAGNOSIS — R338 Other retention of urine: Secondary | ICD-10-CM | POA: Diagnosis not present

## 2024-09-03 DIAGNOSIS — I129 Hypertensive chronic kidney disease with stage 1 through stage 4 chronic kidney disease, or unspecified chronic kidney disease: Secondary | ICD-10-CM | POA: Diagnosis not present

## 2024-09-03 DIAGNOSIS — E7800 Pure hypercholesterolemia, unspecified: Secondary | ICD-10-CM | POA: Diagnosis not present

## 2024-09-03 DIAGNOSIS — E039 Hypothyroidism, unspecified: Secondary | ICD-10-CM | POA: Diagnosis not present

## 2024-09-03 DIAGNOSIS — Z556 Problems related to health literacy: Secondary | ICD-10-CM | POA: Diagnosis not present

## 2024-09-03 DIAGNOSIS — Z7982 Long term (current) use of aspirin: Secondary | ICD-10-CM | POA: Diagnosis not present

## 2024-09-03 DIAGNOSIS — Z9181 History of falling: Secondary | ICD-10-CM | POA: Diagnosis not present

## 2024-09-03 DIAGNOSIS — E1122 Type 2 diabetes mellitus with diabetic chronic kidney disease: Secondary | ICD-10-CM | POA: Diagnosis not present

## 2024-09-03 DIAGNOSIS — N401 Enlarged prostate with lower urinary tract symptoms: Secondary | ICD-10-CM | POA: Diagnosis not present

## 2024-09-03 DIAGNOSIS — Z794 Long term (current) use of insulin: Secondary | ICD-10-CM | POA: Diagnosis not present

## 2024-09-03 DIAGNOSIS — Z7984 Long term (current) use of oral hypoglycemic drugs: Secondary | ICD-10-CM | POA: Diagnosis not present

## 2024-09-03 DIAGNOSIS — Z87440 Personal history of urinary (tract) infections: Secondary | ICD-10-CM | POA: Diagnosis not present

## 2024-09-03 DIAGNOSIS — D631 Anemia in chronic kidney disease: Secondary | ICD-10-CM | POA: Diagnosis not present

## 2024-09-03 DIAGNOSIS — Z960 Presence of urogenital implants: Secondary | ICD-10-CM | POA: Diagnosis not present

## 2024-09-03 DIAGNOSIS — N1832 Chronic kidney disease, stage 3b: Secondary | ICD-10-CM | POA: Diagnosis not present

## 2024-09-06 DIAGNOSIS — E7800 Pure hypercholesterolemia, unspecified: Secondary | ICD-10-CM | POA: Diagnosis not present

## 2024-09-06 DIAGNOSIS — L89620 Pressure ulcer of left heel, unstageable: Secondary | ICD-10-CM | POA: Diagnosis not present

## 2024-09-06 DIAGNOSIS — N401 Enlarged prostate with lower urinary tract symptoms: Secondary | ICD-10-CM | POA: Diagnosis not present

## 2024-09-06 DIAGNOSIS — Z9181 History of falling: Secondary | ICD-10-CM | POA: Diagnosis not present

## 2024-09-06 DIAGNOSIS — I129 Hypertensive chronic kidney disease with stage 1 through stage 4 chronic kidney disease, or unspecified chronic kidney disease: Secondary | ICD-10-CM | POA: Diagnosis not present

## 2024-09-06 DIAGNOSIS — N1832 Chronic kidney disease, stage 3b: Secondary | ICD-10-CM | POA: Diagnosis not present

## 2024-09-06 DIAGNOSIS — E039 Hypothyroidism, unspecified: Secondary | ICD-10-CM | POA: Diagnosis not present

## 2024-09-06 DIAGNOSIS — Z556 Problems related to health literacy: Secondary | ICD-10-CM | POA: Diagnosis not present

## 2024-09-06 DIAGNOSIS — Z87440 Personal history of urinary (tract) infections: Secondary | ICD-10-CM | POA: Diagnosis not present

## 2024-09-06 DIAGNOSIS — Z7984 Long term (current) use of oral hypoglycemic drugs: Secondary | ICD-10-CM | POA: Diagnosis not present

## 2024-09-06 DIAGNOSIS — Z7982 Long term (current) use of aspirin: Secondary | ICD-10-CM | POA: Diagnosis not present

## 2024-09-06 DIAGNOSIS — D631 Anemia in chronic kidney disease: Secondary | ICD-10-CM | POA: Diagnosis not present

## 2024-09-06 DIAGNOSIS — E441 Mild protein-calorie malnutrition: Secondary | ICD-10-CM | POA: Diagnosis not present

## 2024-09-06 DIAGNOSIS — R338 Other retention of urine: Secondary | ICD-10-CM | POA: Diagnosis not present

## 2024-09-06 DIAGNOSIS — Z960 Presence of urogenital implants: Secondary | ICD-10-CM | POA: Diagnosis not present

## 2024-09-06 DIAGNOSIS — Z794 Long term (current) use of insulin: Secondary | ICD-10-CM | POA: Diagnosis not present

## 2024-09-06 DIAGNOSIS — E1122 Type 2 diabetes mellitus with diabetic chronic kidney disease: Secondary | ICD-10-CM | POA: Diagnosis not present

## 2024-09-06 DIAGNOSIS — H409 Unspecified glaucoma: Secondary | ICD-10-CM | POA: Diagnosis not present

## 2024-09-06 DIAGNOSIS — Z8673 Personal history of transient ischemic attack (TIA), and cerebral infarction without residual deficits: Secondary | ICD-10-CM | POA: Diagnosis not present

## 2024-09-08 DIAGNOSIS — E1122 Type 2 diabetes mellitus with diabetic chronic kidney disease: Secondary | ICD-10-CM | POA: Diagnosis not present

## 2024-09-08 DIAGNOSIS — L89620 Pressure ulcer of left heel, unstageable: Secondary | ICD-10-CM | POA: Diagnosis not present

## 2024-09-08 DIAGNOSIS — Z7982 Long term (current) use of aspirin: Secondary | ICD-10-CM | POA: Diagnosis not present

## 2024-09-08 DIAGNOSIS — E441 Mild protein-calorie malnutrition: Secondary | ICD-10-CM | POA: Diagnosis not present

## 2024-09-08 DIAGNOSIS — D631 Anemia in chronic kidney disease: Secondary | ICD-10-CM | POA: Diagnosis not present

## 2024-09-08 DIAGNOSIS — R338 Other retention of urine: Secondary | ICD-10-CM | POA: Diagnosis not present

## 2024-09-08 DIAGNOSIS — Z9181 History of falling: Secondary | ICD-10-CM | POA: Diagnosis not present

## 2024-09-08 DIAGNOSIS — N1832 Chronic kidney disease, stage 3b: Secondary | ICD-10-CM | POA: Diagnosis not present

## 2024-09-08 DIAGNOSIS — Z556 Problems related to health literacy: Secondary | ICD-10-CM | POA: Diagnosis not present

## 2024-09-08 DIAGNOSIS — E7800 Pure hypercholesterolemia, unspecified: Secondary | ICD-10-CM | POA: Diagnosis not present

## 2024-09-08 DIAGNOSIS — Z7984 Long term (current) use of oral hypoglycemic drugs: Secondary | ICD-10-CM | POA: Diagnosis not present

## 2024-09-08 DIAGNOSIS — N401 Enlarged prostate with lower urinary tract symptoms: Secondary | ICD-10-CM | POA: Diagnosis not present

## 2024-09-08 DIAGNOSIS — Z87440 Personal history of urinary (tract) infections: Secondary | ICD-10-CM | POA: Diagnosis not present

## 2024-09-08 DIAGNOSIS — I129 Hypertensive chronic kidney disease with stage 1 through stage 4 chronic kidney disease, or unspecified chronic kidney disease: Secondary | ICD-10-CM | POA: Diagnosis not present

## 2024-09-08 DIAGNOSIS — Z794 Long term (current) use of insulin: Secondary | ICD-10-CM | POA: Diagnosis not present

## 2024-09-08 DIAGNOSIS — H409 Unspecified glaucoma: Secondary | ICD-10-CM | POA: Diagnosis not present

## 2024-09-08 DIAGNOSIS — Z960 Presence of urogenital implants: Secondary | ICD-10-CM | POA: Diagnosis not present

## 2024-09-08 DIAGNOSIS — Z8673 Personal history of transient ischemic attack (TIA), and cerebral infarction without residual deficits: Secondary | ICD-10-CM | POA: Diagnosis not present

## 2024-09-08 DIAGNOSIS — E039 Hypothyroidism, unspecified: Secondary | ICD-10-CM | POA: Diagnosis not present

## 2024-09-21 ENCOUNTER — Encounter (HOSPITAL_BASED_OUTPATIENT_CLINIC_OR_DEPARTMENT_OTHER): Admitting: Internal Medicine

## 2024-09-21 DIAGNOSIS — L89629 Pressure ulcer of left heel, unspecified stage: Secondary | ICD-10-CM

## 2024-09-21 DIAGNOSIS — E11621 Type 2 diabetes mellitus with foot ulcer: Secondary | ICD-10-CM

## 2024-10-03 ENCOUNTER — Other Ambulatory Visit: Payer: Self-pay

## 2024-10-03 ENCOUNTER — Emergency Department (HOSPITAL_BASED_OUTPATIENT_CLINIC_OR_DEPARTMENT_OTHER)
Admission: EM | Admit: 2024-10-03 | Discharge: 2024-10-03 | Disposition: A | Discharge status: Home / Self Care | Attending: Emergency Medicine | Admitting: Emergency Medicine

## 2024-10-03 ENCOUNTER — Encounter (HOSPITAL_BASED_OUTPATIENT_CLINIC_OR_DEPARTMENT_OTHER): Payer: Self-pay

## 2024-10-03 DIAGNOSIS — Z7982 Long term (current) use of aspirin: Secondary | ICD-10-CM | POA: Insufficient documentation

## 2024-10-03 DIAGNOSIS — N3000 Acute cystitis without hematuria: Secondary | ICD-10-CM | POA: Insufficient documentation

## 2024-10-03 DIAGNOSIS — Y738 Miscellaneous gastroenterology and urology devices associated with adverse incidents, not elsewhere classified: Secondary | ICD-10-CM | POA: Insufficient documentation

## 2024-10-03 DIAGNOSIS — T83098A Other mechanical complication of other indwelling urethral catheter, initial encounter: Secondary | ICD-10-CM | POA: Diagnosis present

## 2024-10-03 DIAGNOSIS — E1122 Type 2 diabetes mellitus with diabetic chronic kidney disease: Secondary | ICD-10-CM | POA: Diagnosis not present

## 2024-10-03 DIAGNOSIS — N189 Chronic kidney disease, unspecified: Secondary | ICD-10-CM | POA: Insufficient documentation

## 2024-10-03 DIAGNOSIS — T839XXA Unspecified complication of genitourinary prosthetic device, implant and graft, initial encounter: Secondary | ICD-10-CM

## 2024-10-03 LAB — URINALYSIS, ROUTINE W REFLEX MICROSCOPIC
Bilirubin Urine: NEGATIVE
Glucose, UA: NEGATIVE mg/dL
Ketones, ur: NEGATIVE mg/dL
Nitrite: NEGATIVE
Protein, ur: 30 mg/dL — AB
Specific Gravity, Urine: 1.015 (ref 1.005–1.030)
WBC, UA: >50 WBC/hpf (ref 0–5)
pH: 6 (ref 5.0–8.0)

## 2024-10-03 MED ORDER — CEFPODOXIME PROXETIL 200 MG PO TABS: 200.0000 mg | ORAL_TABLET | Freq: Two times a day (BID) | ORAL | 0 refills | Status: AC

## 2024-10-03 NOTE — Discharge Instructions (Signed)
 Please take the entire course of antibiotics that I prescribed to cover for a catheter associated urinary tract infection.  Please follow-up with your primary care doctor, urologist.

## 2024-10-03 NOTE — ED Notes (Signed)
>  260 cc on bladder scan

## 2024-10-03 NOTE — ED Triage Notes (Incomplete)
 Pt from [facility] via EMS.

## 2024-10-03 NOTE — ED Provider Notes (Signed)
 " [place] EMERGENCY DEPARTMENT AT [facility] Provider Note   CSN: [id number] Arrival date & time: 10/03/24  1245     Patient presents with: Foley Catheter Issue   Jorge Butler is a 89 y.o. male with past medical history significant for hyperlipidemia, previous CVA, diabetes, CKD with chronic indwelling Foley catheter.  Family reports that his catheter came out.  Reportedly has had multiple episodes of catheter coming out over the last week, replaced on Tuesday and Thursday.  Not able to urinate without catheter in place.  Denies fever, chills, abdominal pain.  No other issues today.   HPI     Prior to Admission medications  Medication Sig Start Date End Date Taking? Authorizing Provider  cefpodoxime  (VANTIN ) 200 MG tablet Take 1 tablet (200 mg total) by mouth 2 (two) times daily. 10/03/24  Yes Kallon Caylor H, PA-C  aspirin  EC 81 MG tablet Take 81 mg by mouth daily. 08/26/13   [provider]  atorvastatin  (LIPITOR) 20 MG tablet Take 20 mg by mouth daily.    [provider]  cyanocobalamin  (VITAMIN B12) 500 MCG tablet Take 500 mcg by mouth daily.    [provider]  finasteride  (PROSCAR ) 5 MG tablet Take 5 mg by mouth daily. 08/02/09   [provider]  insulin  aspart (NOVOLOG  FLEXPEN) 100 UNIT/ML FlexPen 0-9 Units, Subcutaneous, 3 times daily with meals CBG < 70: Implement Hypoglycemia measures CBG 70 - 120: 0 units CBG 121 - 150: 1 unit CBG 151 - 200: 2 units CBG 201 - 250: 3 units CBG 251 - 300: 5 units CBG 301 - 350: 7 units CBG 351 - 400: 9 units CBG > 400: call MD 03/18/24   Raenelle Donalda HERO, MD  levothyroxine  (SYNTHROID ) 88 MCG tablet Take 88 mcg by mouth daily before breakfast. 12/04/22   [provider]  Multiple Vitamin (MULTIVITAMIN WITH MINERALS) TABS tablet Take 1 tablet by mouth daily.    [provider]  omega-3 acid ethyl esters (LOVAZA) 1 g capsule Take 1 g by mouth daily.    [provider]  pioglitazone (ACTOS) 45 MG tablet Take 45 mg by mouth daily.    [provider]  polyethylene glycol (MIRALAX  / GLYCOLAX ) 17 g packet Take 17 g by mouth daily. Patient not taking: Reported on 03/13/2024 04/20/23   Dorinda Drue DASEN, MD  Saw Palmetto, Serenoa repens, (SAW PALMETTO PO) Take 1 capsule by mouth daily.    [provider]  SITagliptin 25 MG TABS Take 1 tablet by mouth daily.    [provider]  tamsulosin  (FLOMAX ) 0.4 MG CAPS capsule Take 0.4 mg by mouth daily. Patient not taking: Reported on 03/13/2024 03/22/20   [provider]  Testosterone  Cypionate 200 MG/ML SOSY Inject 200 mg into the muscle every 21 ( twenty-one) days.    [provider]    Allergies: Sulfa antibiotics    Review of Systems  All other systems reviewed and are negative.   Updated Vital Signs BP (!) 127/55   Pulse 74   Temp 97.6 F (36.4 C) (Oral)   Resp 16   Ht 5' 10 (1.778 m)   Wt 75.2 kg   SpO2 100%   BMI 23.79 kg/m   Physical Exam Vitals and nursing note reviewed.  Constitutional:      General: He is not in acute distress.    Appearance: Normal appearance.  HENT:     Head: Normocephalic and atraumatic.  Eyes:  General:        Right eye: No discharge.        Left eye: No discharge.  Cardiovascular:     Rate and Rhythm: Normal rate and regular rhythm.     Heart sounds: No murmur heard.    No friction rub. No gallop.  Pulmonary:     Effort: Pulmonary effort is normal.     Breath sounds: Normal breath sounds.  Abdominal:     General: Bowel sounds are normal.     Palpations: Abdomen is soft.     Comments: Mild tenderness to palpation suprapubic region, no rebound, rigidity, guarding, no overt abdominal swelling appreciated  Skin:    General: Skin is warm and dry.     Capillary Refill: Capillary refill takes less than 2 seconds.  Neurological:     Mental Status: He is alert and oriented to person, place, and time.  Psychiatric:         Mood and Affect: Mood normal.        Behavior: Behavior normal.     (all labs ordered are listed, but only abnormal results are displayed) Labs Reviewed  URINALYSIS, ROUTINE W REFLEX MICROSCOPIC - Abnormal; Notable for the following components:      Result Value   APPearance HAZY (*)    Hgb urine dipstick SMALL (*)    Protein, ur 30 (*)    Leukocytes,Ua LARGE (*)    Bacteria, UA MANY (*)    All other components within normal limits  URINE CULTURE    EKG: None  Radiology: No results found.   Procedures   Medications Ordered in the ED - No data to display                                  Medical Decision Making  This patient is a 89 y.o. male who presents to the ED for concern of catheter issue.   Differential diagnoses prior to evaluation: Displaced urinary catheter, need for exchange, acute urinary retention, catheter associated infection, versus other  Past Medical History / Social History / Additional history: Chart reviewed. Pertinent results include: Chronic indwelling Foley catheter,  hyperlipidemia, previous CVA, diabetes, CKD   Physical Exam: Physical exam performed. The pertinent findings include: Vital signs overall stable, mild tenderness to palpation of the suprapubic region.  Medications / Treatment: Catheter exchange successfully, patient tolerated without difficulty  UA: Hazy, large leukocytes, greater than 50 white blood cells and many bacteria, will send for culture, but will go ahead and treat for possible catheter associated urinary tract infection   Disposition: After consideration of the diagnostic results and the patients response to treatment, I feel that patient with appropriate catheter placement, will treat for catheter associated UTI, send urine for culture, encourage close urology follow-up.   emergency department workup does not suggest an emergent condition requiring admission or immediate intervention beyond what has been performed at  this time. The plan is: as above. The patient is safe for discharge and has been instructed to return immediately for worsening symptoms, change in symptoms or any other concerns.    Final diagnoses:  Problem with Foley catheter, initial encounter  Acute cystitis without hematuria    ED Discharge Orders          Ordered    cefpodoxime  (VANTIN ) 200 MG tablet  2 times daily        10/03/24 1545  Rosan Sherlean DEL, [place] 10/03/24 1545  "

## 2024-10-05 LAB — URINE CULTURE: Culture: >=100000 — AB

## 2024-10-06 ENCOUNTER — Telehealth (HOSPITAL_BASED_OUTPATIENT_CLINIC_OR_DEPARTMENT_OTHER): Payer: Self-pay | Admitting: *Deleted

## 2024-10-06 NOTE — Telephone Encounter (Signed)
 Post ED Visit - Positive Culture Follow-up  Culture report reviewed by antimicrobial stewardship pharmacist: [facility] [x]  [place], [place].D. []  Venetia Gully, Pharm.D., BCPS AQ-ID []  Garrel Crews, Pharm.D., BCPS []  Almarie Lunger, [address].D., BCPS []  [place], [address].D., BCPS, AAHIVP []  Rosaline Bihari, Pharm.D., BCPS, AAHIVP []  Vernell Meier, PharmD, BCPS []  Latanya Hint, PharmD, BCPS []  Donald Medley, PharmD, BCPS []  Rocky Bold, PharmD []  Dorothyann Alert, PharmD, BCPS []  Morene Babe, PharmD  [facility] []  Rosaline Edison, PharmD []  Romona Bliss, PharmD []  Dolphus Roller, PharmD []  Veva Seip, Rph []  Vernell Daunt) Leonce, PharmD []  Eva Allis, PharmD []  Rosaline Millet, PharmD []  Iantha Batch, PharmD []  Arvin Gauss, PharmD []  Wanda Hasting, PharmD []  Ronal Rav, PharmD []  Rocky Slade, PharmD []  Bard Jeans, PharmD   Positive urine culture Treated with Cefpodoxime  Proxetil, organism sensitive to the same and no further patient follow-up is required at this time.  Jorge Butler 10/06/2024, 12:41 PM
